# Patient Record
Sex: Female | Born: 1935
Health system: Southern US, Community
[De-identification: ages and names within clinical notes are randomized; demographics above are authoritative.]

## PROBLEM LIST (undated history)

## (undated) DIAGNOSIS — R002 Palpitations: Secondary | ICD-10-CM

## (undated) DIAGNOSIS — H9190 Unspecified hearing loss, unspecified ear: Secondary | ICD-10-CM

## (undated) DIAGNOSIS — I10 Essential (primary) hypertension: Secondary | ICD-10-CM

## (undated) DIAGNOSIS — E039 Hypothyroidism, unspecified: Secondary | ICD-10-CM

## (undated) DIAGNOSIS — D696 Thrombocytopenia, unspecified: Secondary | ICD-10-CM

## (undated) DIAGNOSIS — C9110 Chronic lymphocytic leukemia of B-cell type not having achieved remission: Secondary | ICD-10-CM

## (undated) DIAGNOSIS — E785 Hyperlipidemia, unspecified: Secondary | ICD-10-CM

## (undated) DIAGNOSIS — Z9181 History of falling: Secondary | ICD-10-CM

## (undated) DIAGNOSIS — Z9221 Personal history of antineoplastic chemotherapy: Secondary | ICD-10-CM

## (undated) DIAGNOSIS — D649 Anemia, unspecified: Secondary | ICD-10-CM

## (undated) DIAGNOSIS — H269 Unspecified cataract: Secondary | ICD-10-CM

## (undated) HISTORY — DX: Palpitations: R00.2

## (undated) HISTORY — DX: Unspecified cataract: H26.9

## (undated) HISTORY — DX: Chronic lymphocytic leukemia of B-cell type not having achieved remission: C91.10

## (undated) HISTORY — DX: Thrombocytopenia, unspecified: D69.6

## (undated) HISTORY — DX: Anemia, unspecified: D64.9

## (undated) HISTORY — DX: Hyperlipidemia, unspecified: E78.5

## (undated) HISTORY — DX: History of falling: Z91.81

## (undated) HISTORY — DX: Unspecified hearing loss, unspecified ear: H91.90

## (undated) HISTORY — DX: Hypothyroidism, unspecified: E03.9

## (undated) HISTORY — DX: Personal history of antineoplastic chemotherapy: Z92.21

---

## 2007-04-16 ENCOUNTER — Emergency Department: Payer: Self-pay | Admitting: Emergency Medicine

## 2011-04-15 ENCOUNTER — Ambulatory Visit: Payer: Self-pay | Admitting: Internal Medicine

## 2011-04-20 ENCOUNTER — Ambulatory Visit: Payer: Self-pay | Admitting: Oncology

## 2011-04-20 LAB — BASIC METABOLIC PANEL
BUN: 30 mg/dL — ABNORMAL HIGH (ref 7–18)
Calcium, Total: 9.2 mg/dL (ref 8.5–10.1)
Chloride: 106 mmol/L (ref 98–107)
Co2: 26 mmol/L (ref 21–32)
Creatinine: 1.16 mg/dL (ref 0.60–1.30)
EGFR (Non-African Amer.): 48 — ABNORMAL LOW
Osmolality: 291 (ref 275–301)
Potassium: 3.8 mmol/L (ref 3.5–5.1)
Sodium: 142 mmol/L (ref 136–145)

## 2011-04-20 LAB — CBC CANCER CENTER
Eosinophil #: 0.2 x10 3/mm (ref 0.0–0.7)
Eosinophil %: 0.1 %
HGB: 8.4 g/dL — ABNORMAL LOW (ref 12.0–16.0)
Lymphocyte %: 95.2 %
MCH: 32.8 pg (ref 26.0–34.0)
Neutrophil #: 7.1 x10 3/mm — ABNORMAL HIGH (ref 1.4–6.5)
Neutrophil %: 4.2 %
Platelet: 109 x10 3/mm — ABNORMAL LOW (ref 150–440)
WBC: 171 x10 3/mm — ABNORMAL HIGH (ref 3.6–11.0)

## 2011-04-20 LAB — LACTATE DEHYDROGENASE: LDH: 520 U/L — ABNORMAL HIGH (ref 84–246)

## 2011-04-30 ENCOUNTER — Ambulatory Visit: Payer: Self-pay | Admitting: General Surgery

## 2011-05-04 ENCOUNTER — Ambulatory Visit: Payer: Self-pay | Admitting: General Surgery

## 2011-05-04 HISTORY — PX: OTHER SURGICAL HISTORY: SHX169

## 2011-05-13 LAB — CBC CANCER CENTER
Basophil %: 0 %
HCT: 25.7 % — ABNORMAL LOW (ref 35.0–47.0)
HGB: 7.1 g/dL — ABNORMAL LOW (ref 12.0–16.0)
Lymphocyte #: 119.2 x10 3/mm — ABNORMAL HIGH (ref 1.0–3.6)
Lymphocyte %: 94.1 %
MCV: 107 fL — ABNORMAL HIGH (ref 80–100)
Monocyte #: 1.3 x10 3/mm — ABNORMAL HIGH (ref 0.2–0.9)
Monocyte %: 1.1 %
Neutrophil #: 5.8 x10 3/mm (ref 1.4–6.5)
Neutrophil %: 4.6 %
Platelet: 79 x10 3/mm — ABNORMAL LOW (ref 150–440)
RBC: 2.4 10*6/uL — ABNORMAL LOW (ref 3.80–5.20)
RDW: 18.9 % — ABNORMAL HIGH (ref 11.5–14.5)

## 2011-05-13 LAB — BASIC METABOLIC PANEL
BUN: 23 mg/dL — ABNORMAL HIGH (ref 7–18)
Calcium, Total: 8.9 mg/dL (ref 8.5–10.1)
EGFR (Non-African Amer.): 52 — ABNORMAL LOW
Osmolality: 280 (ref 275–301)
Potassium: 3.5 mmol/L (ref 3.5–5.1)

## 2011-05-18 LAB — CBC CANCER CENTER
Basophil #: 0.1 x10 3/mm (ref 0.0–0.1)
Eosinophil #: 0.2 x10 3/mm (ref 0.0–0.7)
HCT: 33.4 % — ABNORMAL LOW (ref 35.0–47.0)
HGB: 10.3 g/dL — ABNORMAL LOW (ref 12.0–16.0)
Lymphocyte %: 89.6 %
MCV: 100 fL (ref 80–100)
Monocyte #: 0.2 x10 3/mm (ref 0.2–0.9)
Monocyte %: 0.3 %
Neutrophil #: 5.6 x10 3/mm (ref 1.4–6.5)
RBC: 3.36 10*6/uL — ABNORMAL LOW (ref 3.80–5.20)
RDW: 17.1 % — ABNORMAL HIGH (ref 11.5–14.5)

## 2011-05-18 LAB — IRON AND TIBC
Iron Bind.Cap.(Total): 263 ug/dL (ref 250–450)
Iron Saturation: 10 %
Unbound Iron-Bind.Cap.: 238 ug/dL

## 2011-05-20 ENCOUNTER — Ambulatory Visit: Payer: Self-pay | Admitting: Oncology

## 2011-05-25 LAB — CBC CANCER CENTER
Basophil %: 0.4 %
Eosinophil #: 0.2 x10 3/mm (ref 0.0–0.7)
HCT: 33.1 % — ABNORMAL LOW (ref 35.0–47.0)
HGB: 10.2 g/dL — ABNORMAL LOW (ref 12.0–16.0)
Lymphocyte #: 69.4 x10 3/mm — ABNORMAL HIGH (ref 1.0–3.6)
Lymphocyte %: 89.4 %
MCH: 30.9 pg (ref 26.0–34.0)
MCV: 100 fL (ref 80–100)
Monocyte #: 1.9 x10 3/mm — ABNORMAL HIGH (ref 0.2–0.9)
Monocyte %: 2.4 %
Neutrophil #: 5.9 x10 3/mm (ref 1.4–6.5)
Neutrophil %: 7.6 %
Platelet: 102 x10 3/mm — ABNORMAL LOW (ref 150–440)
RBC: 3.29 10*6/uL — ABNORMAL LOW (ref 3.80–5.20)
RDW: 17.5 % — ABNORMAL HIGH (ref 11.5–14.5)

## 2011-05-25 LAB — BASIC METABOLIC PANEL
Anion Gap: 8 (ref 7–16)
Calcium, Total: 9 mg/dL (ref 8.5–10.1)
Chloride: 103 mmol/L (ref 98–107)
Co2: 30 mmol/L (ref 21–32)
Creatinine: 0.97 mg/dL (ref 0.60–1.30)
Glucose: 143 mg/dL — ABNORMAL HIGH (ref 65–99)
Osmolality: 283 (ref 275–301)
Sodium: 139 mmol/L (ref 136–145)

## 2011-06-02 LAB — CBC CANCER CENTER
Basophil #: 0 x10 3/mm (ref 0.0–0.1)
Basophil %: 0.4 %
Eosinophil #: 0 x10 3/mm (ref 0.0–0.7)
Eosinophil %: 0.9 %
HCT: 29.9 % — ABNORMAL LOW (ref 35.0–47.0)
HGB: 9.6 g/dL — ABNORMAL LOW (ref 12.0–16.0)
Lymphocyte #: 1.8 x10 3/mm (ref 1.0–3.6)
Lymphocyte %: 39.7 %
MCV: 96 fL (ref 80–100)
Monocyte #: 0.5 x10 3/mm (ref 0.2–0.9)
Monocyte %: 11.6 %
Neutrophil %: 47.4 %
RBC: 3.13 10*6/uL — ABNORMAL LOW (ref 3.80–5.20)

## 2011-06-08 LAB — CBC CANCER CENTER
Basophil #: 0 x10 3/mm (ref 0.0–0.1)
Basophil %: 0.8 %
Eosinophil #: 0 x10 3/mm (ref 0.0–0.7)
Eosinophil %: 0.7 %
HCT: 25.6 % — ABNORMAL LOW (ref 35.0–47.0)
Lymphocyte %: 21.9 %
MCH: 30.1 pg (ref 26.0–34.0)
Monocyte #: 0.4 x10 3/mm (ref 0.2–0.9)
Monocyte %: 10.5 %
Neutrophil %: 66.1 %
Platelet: 162 x10 3/mm (ref 150–440)
RDW: 17.6 % — ABNORMAL HIGH (ref 11.5–14.5)

## 2011-06-16 LAB — CBC CANCER CENTER
Basophil %: 1 %
HCT: 22.6 % — ABNORMAL LOW (ref 35.0–47.0)
Lymphocyte %: 24.9 %
MCH: 29 pg (ref 26.0–34.0)
MCV: 91 fL (ref 80–100)
Monocyte %: 11.8 %
Platelet: 312 x10 3/mm (ref 150–440)

## 2011-06-20 ENCOUNTER — Ambulatory Visit: Payer: Self-pay | Admitting: Oncology

## 2011-06-22 LAB — CBC CANCER CENTER
Basophil %: 1.9 %
Eosinophil %: 1.2 %
HCT: 34.9 % — ABNORMAL LOW (ref 35.0–47.0)
HGB: 11.2 g/dL — ABNORMAL LOW (ref 12.0–16.0)
Lymphocyte #: 1.2 x10 3/mm (ref 1.0–3.6)
Lymphocyte %: 20.9 %
MCH: 28.7 pg (ref 26.0–34.0)
MCHC: 32.2 g/dL (ref 32.0–36.0)
Monocyte #: 0.4 x10 3/mm (ref 0.2–0.9)
Monocyte %: 7.1 %
Neutrophil #: 3.8 x10 3/mm (ref 1.4–6.5)
Platelet: 212 x10 3/mm (ref 150–440)
RBC: 3.92 10*6/uL (ref 3.80–5.20)
RDW: 17.9 % — ABNORMAL HIGH (ref 11.5–14.5)

## 2011-06-22 LAB — COMPREHENSIVE METABOLIC PANEL
Albumin: 3.2 g/dL — ABNORMAL LOW (ref 3.4–5.0)
Alkaline Phosphatase: 59 U/L (ref 50–136)
Chloride: 104 mmol/L (ref 98–107)
Creatinine: 0.84 mg/dL (ref 0.60–1.30)
EGFR (Non-African Amer.): >60
Glucose: 138 mg/dL — ABNORMAL HIGH (ref 65–99)
Sodium: 141 mmol/L (ref 136–145)

## 2011-06-29 LAB — CBC CANCER CENTER
Basophil #: 0 x10 3/mm (ref 0.0–0.1)
Basophil %: 0.6 %
Eosinophil %: 1.9 %
HCT: 32.1 % — ABNORMAL LOW (ref 35.0–47.0)
MCH: 28.7 pg (ref 26.0–34.0)
Monocyte #: 0.3 x10 3/mm (ref 0.2–0.9)
RDW: 18.9 % — ABNORMAL HIGH (ref 11.5–14.5)

## 2011-07-06 LAB — CBC CANCER CENTER
Eosinophil #: 0.1 x10 3/mm (ref 0.0–0.7)
HCT: 31.7 % — ABNORMAL LOW (ref 35.0–47.0)
MCH: 28.4 pg (ref 26.0–34.0)
Monocyte #: 0.2 x10 3/mm (ref 0.2–0.9)
Monocyte %: 12.7 %
Neutrophil #: 1 x10 3/mm — ABNORMAL LOW (ref 1.4–6.5)
Neutrophil %: 55.9 %
Platelet: 120 x10 3/mm — ABNORMAL LOW (ref 150–440)
RBC: 3.55 10*6/uL — ABNORMAL LOW (ref 3.80–5.20)
RDW: 18.8 % — ABNORMAL HIGH (ref 11.5–14.5)
WBC: 1.8 x10 3/mm — CL (ref 3.6–11.0)

## 2011-07-13 LAB — CBC CANCER CENTER
Basophil #: 0 x10 3/mm (ref 0.0–0.1)
Basophil %: 0.6 %
Eosinophil %: 3.3 %
Lymphocyte #: 1.1 x10 3/mm (ref 1.0–3.6)
Lymphocyte %: 42.7 %
MCHC: 32.1 g/dL (ref 32.0–36.0)
MCV: 88 fL (ref 80–100)
Monocyte #: 0.3 x10 3/mm (ref 0.2–0.9)
Monocyte %: 13.2 %
Platelet: 105 x10 3/mm — ABNORMAL LOW (ref 150–440)
RBC: 3.86 10*6/uL (ref 3.80–5.20)
RDW: 18.4 % — ABNORMAL HIGH (ref 11.5–14.5)
WBC: 2.6 x10 3/mm — ABNORMAL LOW (ref 3.6–11.0)

## 2011-07-20 ENCOUNTER — Ambulatory Visit: Payer: Self-pay | Admitting: Oncology

## 2011-07-20 LAB — CBC CANCER CENTER
Basophil #: 0 x10 3/mm (ref 0.0–0.1)
Eosinophil #: 0.2 x10 3/mm (ref 0.0–0.7)
Eosinophil %: 5.6 %
HCT: 36.8 % (ref 35.0–47.0)
HGB: 11.7 g/dL — ABNORMAL LOW (ref 12.0–16.0)
Lymphocyte #: 1.2 x10 3/mm (ref 1.0–3.6)
Lymphocyte %: 41.5 %
MCH: 28.2 pg (ref 26.0–34.0)
MCHC: 31.7 g/dL — ABNORMAL LOW (ref 32.0–36.0)
Neutrophil #: 1.2 x10 3/mm — ABNORMAL LOW (ref 1.4–6.5)
RBC: 4.14 10*6/uL (ref 3.80–5.20)
RDW: 17 % — ABNORMAL HIGH (ref 11.5–14.5)

## 2011-07-20 LAB — COMPREHENSIVE METABOLIC PANEL
Albumin: 3.5 g/dL (ref 3.4–5.0)
Anion Gap: 9 (ref 7–16)
BUN: 18 mg/dL (ref 7–18)
Chloride: 104 mmol/L (ref 98–107)
Co2: 28 mmol/L (ref 21–32)
EGFR (African American): >60
EGFR (Non-African Amer.): >60
Osmolality: 284 (ref 275–301)
Potassium: 4 mmol/L (ref 3.5–5.1)
SGOT(AST): 18 U/L (ref 15–37)

## 2011-07-27 LAB — CBC CANCER CENTER
Basophil #: 0 x10 3/mm (ref 0.0–0.1)
Basophil %: 0.8 %
Eosinophil %: 7.1 %
HCT: 36.8 % (ref 35.0–47.0)
HGB: 12 g/dL (ref 12.0–16.0)
Lymphocyte #: 1.4 x10 3/mm (ref 1.0–3.6)
Lymphocyte %: 40.1 %
MCV: 88 fL (ref 80–100)
Monocyte %: 8.5 %
Neutrophil #: 1.5 x10 3/mm (ref 1.4–6.5)
Platelet: 106 x10 3/mm — ABNORMAL LOW (ref 150–440)
RDW: 16.9 % — ABNORMAL HIGH (ref 11.5–14.5)
WBC: 3.4 x10 3/mm — ABNORMAL LOW (ref 3.6–11.0)

## 2011-08-03 LAB — CBC CANCER CENTER
Basophil #: 0 x10 3/mm (ref 0.0–0.1)
Eosinophil #: 0.1 x10 3/mm (ref 0.0–0.7)
Eosinophil %: 7.2 %
Lymphocyte #: 0.4 x10 3/mm — ABNORMAL LOW (ref 1.0–3.6)
MCH: 28.4 pg (ref 26.0–34.0)
MCV: 88 fL (ref 80–100)
Monocyte #: 0.3 x10 3/mm (ref 0.2–0.9)
Monocyte %: 14 %
Neutrophil #: 1.2 x10 3/mm — ABNORMAL LOW (ref 1.4–6.5)
Neutrophil %: 59 %
Platelet: 112 x10 3/mm — ABNORMAL LOW (ref 150–440)
RBC: 4.43 10*6/uL (ref 3.80–5.20)
RDW: 16.4 % — ABNORMAL HIGH (ref 11.5–14.5)

## 2011-08-10 LAB — CBC CANCER CENTER
Basophil #: 0 x10 3/mm (ref 0.0–0.1)
Eosinophil #: 0.1 x10 3/mm (ref 0.0–0.7)
HCT: 36.8 % (ref 35.0–47.0)
HGB: 12 g/dL (ref 12.0–16.0)
Lymphocyte #: 0.3 x10 3/mm — ABNORMAL LOW (ref 1.0–3.6)
MCH: 28.5 pg (ref 26.0–34.0)
MCV: 88 fL (ref 80–100)
Monocyte #: 0.3 x10 3/mm (ref 0.2–0.9)
Monocyte %: 19.2 %
Platelet: 129 x10 3/mm — ABNORMAL LOW (ref 150–440)
RBC: 4.2 10*6/uL (ref 3.80–5.20)
RDW: 16 % — ABNORMAL HIGH (ref 11.5–14.5)
WBC: 1.8 x10 3/mm — CL (ref 3.6–11.0)

## 2011-08-17 LAB — CBC CANCER CENTER
Basophil %: 1 %
HCT: 38 % (ref 35.0–47.0)
HGB: 13.1 g/dL (ref 12.0–16.0)
Lymphocyte #: 0.4 x10 3/mm — ABNORMAL LOW (ref 1.0–3.6)
Lymphocyte %: 20.5 %
MCH: 29.7 pg (ref 26.0–34.0)
MCHC: 34.5 g/dL (ref 32.0–36.0)
MCV: 86 fL (ref 80–100)
Monocyte %: 14.7 %
Neutrophil #: 1.1 x10 3/mm — ABNORMAL LOW (ref 1.4–6.5)
Platelet: 117 x10 3/mm — ABNORMAL LOW (ref 150–440)

## 2011-08-20 ENCOUNTER — Ambulatory Visit: Payer: Self-pay | Admitting: Oncology

## 2011-08-24 LAB — CBC CANCER CENTER
Basophil #: 0 x10 3/mm (ref 0.0–0.1)
Basophil %: 1.5 %
Eosinophil #: 0.1 x10 3/mm (ref 0.0–0.7)
HGB: 12.5 g/dL (ref 12.0–16.0)
Lymphocyte #: 0.5 x10 3/mm — ABNORMAL LOW (ref 1.0–3.6)
Lymphocyte %: 21.5 %
MCHC: 34.2 g/dL (ref 32.0–36.0)
MCV: 87 fL (ref 80–100)
Neutrophil %: 54.7 %

## 2011-08-24 LAB — COMPREHENSIVE METABOLIC PANEL
Albumin: 3.5 g/dL (ref 3.4–5.0)
Alkaline Phosphatase: 71 U/L (ref 50–136)
BUN: 16 mg/dL (ref 7–18)
Bilirubin,Total: 0.6 mg/dL (ref 0.2–1.0)
Calcium, Total: 8.8 mg/dL (ref 8.5–10.1)
Chloride: 103 mmol/L (ref 98–107)
Co2: 29 mmol/L (ref 21–32)
Creatinine: 0.88 mg/dL (ref 0.60–1.30)
EGFR (African American): >60
EGFR (Non-African Amer.): >60
Osmolality: 285 (ref 275–301)
Potassium: 3.6 mmol/L (ref 3.5–5.1)
SGPT (ALT): 27 U/L (ref 12–78)
Sodium: 142 mmol/L (ref 136–145)
Total Protein: 6.1 g/dL — ABNORMAL LOW (ref 6.4–8.2)

## 2011-08-31 LAB — CBC CANCER CENTER
Eosinophil %: 3.6 %
HGB: 12.1 g/dL (ref 12.0–16.0)
Lymphocyte #: 0.6 x10 3/mm — ABNORMAL LOW (ref 1.0–3.6)
MCV: 86 fL (ref 80–100)
Monocyte %: 12.4 %
Neutrophil #: 1.3 x10 3/mm — ABNORMAL LOW (ref 1.4–6.5)
Neutrophil %: 58.4 %
Platelet: 112 x10 3/mm — ABNORMAL LOW (ref 150–440)
RBC: 4.08 10*6/uL (ref 3.80–5.20)

## 2011-09-07 LAB — CBC CANCER CENTER
Basophil #: 0 x10 3/mm (ref 0.0–0.1)
Eosinophil #: 0.1 x10 3/mm (ref 0.0–0.7)
Eosinophil %: 3.5 %
Lymphocyte %: 35.8 %
MCH: 28.9 pg (ref 26.0–34.0)
MCHC: 33.3 g/dL (ref 32.0–36.0)
MCV: 87 fL (ref 80–100)
Monocyte #: 0.4 x10 3/mm (ref 0.2–0.9)
Monocyte %: 13.6 %
Neutrophil %: 46.4 %
Platelet: 155 x10 3/mm (ref 150–440)
RDW: 16.2 % — ABNORMAL HIGH (ref 11.5–14.5)
WBC: 2.7 x10 3/mm — ABNORMAL LOW (ref 3.6–11.0)

## 2011-09-14 LAB — CBC CANCER CENTER
Basophil #: 0 x10 3/mm (ref 0.0–0.1)
Basophil %: 0.5 %
Eosinophil %: 2.9 %
HGB: 12.1 g/dL (ref 12.0–16.0)
Lymphocyte #: 1.6 x10 3/mm (ref 1.0–3.6)
Lymphocyte %: 51.3 %
MCH: 29.1 pg (ref 26.0–34.0)
MCHC: 33.1 g/dL (ref 32.0–36.0)
Monocyte #: 0.5 x10 3/mm (ref 0.2–0.9)
Neutrophil #: 0.9 x10 3/mm — ABNORMAL LOW (ref 1.4–6.5)
Neutrophil %: 29.7 %
Platelet: 133 x10 3/mm — ABNORMAL LOW (ref 150–440)
RBC: 4.15 10*6/uL (ref 3.80–5.20)
WBC: 3.2 x10 3/mm — ABNORMAL LOW (ref 3.6–11.0)

## 2011-09-20 ENCOUNTER — Ambulatory Visit: Payer: Self-pay | Admitting: Oncology

## 2011-09-22 LAB — COMPREHENSIVE METABOLIC PANEL
Albumin: 3.8 g/dL (ref 3.4–5.0)
Anion Gap: 9 (ref 7–16)
BUN: 16 mg/dL (ref 7–18)
Calcium, Total: 9.3 mg/dL (ref 8.5–10.1)
Co2: 28 mmol/L (ref 21–32)
EGFR (Non-African Amer.): >60
Glucose: 116 mg/dL — ABNORMAL HIGH (ref 65–99)
Potassium: 3.7 mmol/L (ref 3.5–5.1)
SGOT(AST): 25 U/L (ref 15–37)
Total Protein: 6.6 g/dL (ref 6.4–8.2)

## 2011-09-22 LAB — CBC CANCER CENTER
Basophil #: 0 x10 3/mm (ref 0.0–0.1)
Basophil %: 1.1 %
Eosinophil #: 0.2 x10 3/mm (ref 0.0–0.7)
Eosinophil %: 5 %
HCT: 39.3 % (ref 35.0–47.0)
MCHC: 33.3 g/dL (ref 32.0–36.0)
MCV: 88 fL (ref 80–100)
Monocyte #: 0.4 x10 3/mm (ref 0.2–0.9)
Monocyte %: 12.1 %
Neutrophil #: 1.2 x10 3/mm — ABNORMAL LOW (ref 1.4–6.5)
RBC: 4.46 10*6/uL (ref 3.80–5.20)
WBC: 3.2 x10 3/mm — ABNORMAL LOW (ref 3.6–11.0)

## 2011-09-29 LAB — CBC CANCER CENTER
Basophil %: 0.9 %
Eosinophil #: 0.1 x10 3/mm (ref 0.0–0.7)
Eosinophil %: 4.6 %
Lymphocyte #: 0.5 x10 3/mm — ABNORMAL LOW (ref 1.0–3.6)
Lymphocyte %: 24 %
MCH: 30.8 pg (ref 26.0–34.0)
MCHC: 35.7 g/dL (ref 32.0–36.0)
Monocyte #: 0.3 x10 3/mm (ref 0.2–0.9)
Monocyte %: 14.4 %
Neutrophil #: 1.1 x10 3/mm — ABNORMAL LOW (ref 1.4–6.5)
Neutrophil %: 56.1 %
RBC: 4.14 10*6/uL (ref 3.80–5.20)

## 2011-10-06 LAB — CBC CANCER CENTER
Basophil #: 0 x10 3/mm (ref 0.0–0.1)
Basophil %: 1.2 %
Eosinophil #: 0.1 x10 3/mm (ref 0.0–0.7)
HGB: 12.1 g/dL (ref 12.0–16.0)
Lymphocyte %: 47 %
MCH: 29.7 pg (ref 26.0–34.0)
MCHC: 33.8 g/dL (ref 32.0–36.0)
MCV: 88 fL (ref 80–100)
Monocyte #: 0.5 x10 3/mm (ref 0.2–0.9)
Neutrophil #: 0.2 x10 3/mm — ABNORMAL LOW (ref 1.4–6.5)
Neutrophil %: 13.4 %
Platelet: 150 x10 3/mm (ref 150–440)
RDW: 14.8 % — ABNORMAL HIGH (ref 11.5–14.5)

## 2011-10-13 LAB — CBC CANCER CENTER
Basophil #: 0 x10 3/mm (ref 0.0–0.1)
Eosinophil #: 0.1 x10 3/mm (ref 0.0–0.7)
Lymphocyte %: 41 %
MCH: 29.7 pg (ref 26.0–34.0)
MCHC: 33.7 g/dL (ref 32.0–36.0)
Monocyte #: 0.4 x10 3/mm (ref 0.2–0.9)
Neutrophil #: 0.3 x10 3/mm — ABNORMAL LOW (ref 1.4–6.5)
Neutrophil %: 20.9 %
RDW: 14.7 % — ABNORMAL HIGH (ref 11.5–14.5)
WBC: 1.5 x10 3/mm — CL (ref 3.6–11.0)

## 2011-10-20 ENCOUNTER — Ambulatory Visit: Payer: Self-pay | Admitting: Oncology

## 2011-10-20 LAB — CBC CANCER CENTER
Basophil %: 1.2 %
Eosinophil #: 0.1 x10 3/mm (ref 0.0–0.7)
HCT: 40.2 % (ref 35.0–47.0)
HGB: 13.4 g/dL (ref 12.0–16.0)
Lymphocyte #: 0.7 x10 3/mm — ABNORMAL LOW (ref 1.0–3.6)
MCH: 29.7 pg (ref 26.0–34.0)
MCHC: 33.4 g/dL (ref 32.0–36.0)
MCV: 89 fL (ref 80–100)
Monocyte #: 0.4 x10 3/mm (ref 0.2–0.9)
Neutrophil #: 1.1 x10 3/mm — ABNORMAL LOW (ref 1.4–6.5)
RBC: 4.52 10*6/uL (ref 3.80–5.20)

## 2011-10-20 LAB — COMPREHENSIVE METABOLIC PANEL
Albumin: 3.7 g/dL (ref 3.4–5.0)
Alkaline Phosphatase: 73 U/L (ref 50–136)
Bilirubin,Total: 0.5 mg/dL (ref 0.2–1.0)
Calcium, Total: 9.3 mg/dL (ref 8.5–10.1)
Chloride: 102 mmol/L (ref 98–107)
Creatinine: 0.87 mg/dL (ref 0.60–1.30)
Glucose: 109 mg/dL — ABNORMAL HIGH (ref 65–99)
Osmolality: 283 (ref 275–301)
Potassium: 3.9 mmol/L (ref 3.5–5.1)
Sodium: 141 mmol/L (ref 136–145)

## 2011-10-27 LAB — CBC CANCER CENTER
Basophil #: 0 x10 3/mm (ref 0.0–0.1)
Eosinophil #: 0.1 x10 3/mm (ref 0.0–0.7)
HCT: 41.3 % (ref 35.0–47.0)
HGB: 13.8 g/dL (ref 12.0–16.0)
Lymphocyte #: 1 x10 3/mm (ref 1.0–3.6)
Lymphocyte %: 32.1 %
MCH: 29.8 pg (ref 26.0–34.0)
MCHC: 33.3 g/dL (ref 32.0–36.0)
MCV: 89 fL (ref 80–100)
Monocyte %: 16.6 %
Neutrophil %: 46.3 %
RBC: 4.62 10*6/uL (ref 3.80–5.20)
RDW: 14.6 % — ABNORMAL HIGH (ref 11.5–14.5)
WBC: 3.2 x10 3/mm — ABNORMAL LOW (ref 3.6–11.0)

## 2011-11-03 LAB — CBC CANCER CENTER
Bands: 4 %
Eosinophil: 1 %
Lymphocytes: 1 %
MCH: 30 pg (ref 26.0–34.0)
MCHC: 33.5 g/dL (ref 32.0–36.0)
Platelet: 122 x10 3/mm — ABNORMAL LOW (ref 150–440)
RDW: 15.2 % — ABNORMAL HIGH (ref 11.5–14.5)
Segmented Neutrophils: 77 %
WBC: 9.3 x10 3/mm (ref 3.6–11.0)

## 2011-11-10 LAB — CBC CANCER CENTER
Basophil #: 0 x10 3/mm (ref 0.0–0.1)
Basophil %: 0.9 %
Eosinophil #: 0.1 x10 3/mm (ref 0.0–0.7)
HCT: 39.1 % (ref 35.0–47.0)
HGB: 13 g/dL (ref 12.0–16.0)
Lymphocyte #: 0.6 x10 3/mm — ABNORMAL LOW (ref 1.0–3.6)
MCH: 29.7 pg (ref 26.0–34.0)
MCHC: 33.1 g/dL (ref 32.0–36.0)
MCV: 90 fL (ref 80–100)
Monocyte #: 0.5 x10 3/mm (ref 0.2–0.9)
Monocyte %: 15.4 %
Neutrophil #: 2.1 x10 3/mm (ref 1.4–6.5)
RBC: 4.36 10*6/uL (ref 3.80–5.20)
RDW: 14.6 % — ABNORMAL HIGH (ref 11.5–14.5)
WBC: 3.3 x10 3/mm — ABNORMAL LOW (ref 3.6–11.0)

## 2011-11-17 ENCOUNTER — Ambulatory Visit: Payer: Self-pay | Admitting: Oncology

## 2011-11-17 LAB — CBC CANCER CENTER
Basophil #: 0 x10 3/mm (ref 0.0–0.1)
Eosinophil #: 0.1 x10 3/mm (ref 0.0–0.7)
Eosinophil %: 3.2 %
HGB: 13.5 g/dL (ref 12.0–16.0)
Lymphocyte #: 0.9 x10 3/mm — ABNORMAL LOW (ref 1.0–3.6)
Lymphocyte %: 20.9 %
MCH: 29.9 pg (ref 26.0–34.0)
MCHC: 33.1 g/dL (ref 32.0–36.0)
MCV: 90 fL (ref 80–100)
Monocyte #: 0.6 x10 3/mm (ref 0.2–0.9)
Neutrophil #: 2.6 x10 3/mm (ref 1.4–6.5)
Neutrophil %: 60.9 %
Platelet: 135 x10 3/mm — ABNORMAL LOW (ref 150–440)
RBC: 4.53 10*6/uL (ref 3.80–5.20)
RDW: 14.6 % — ABNORMAL HIGH (ref 11.5–14.5)

## 2011-11-20 ENCOUNTER — Ambulatory Visit: Payer: Self-pay | Admitting: Oncology

## 2011-11-24 LAB — COMPREHENSIVE METABOLIC PANEL
Albumin: 3.8 g/dL (ref 3.4–5.0)
Anion Gap: 13 (ref 7–16)
BUN: 20 mg/dL — ABNORMAL HIGH (ref 7–18)
Calcium, Total: 9.5 mg/dL (ref 8.5–10.1)
Chloride: 100 mmol/L (ref 98–107)
Co2: 23 mmol/L (ref 21–32)
EGFR (African American): >60
EGFR (Non-African Amer.): >60
Glucose: 89 mg/dL (ref 65–99)
Osmolality: 274 (ref 275–301)
Potassium: 3.8 mmol/L (ref 3.5–5.1)
SGOT(AST): 27 U/L (ref 15–37)
Sodium: 136 mmol/L (ref 136–145)

## 2011-11-24 LAB — CBC CANCER CENTER
Basophil #: 0 x10 3/mm (ref 0.0–0.1)
Eosinophil #: 0.2 x10 3/mm (ref 0.0–0.7)
Lymphocyte %: 22.2 %
MCHC: 33.8 g/dL (ref 32.0–36.0)
Monocyte %: 14.1 %
Neutrophil #: 2.2 x10 3/mm (ref 1.4–6.5)
Neutrophil %: 58.1 %
Platelet: 146 x10 3/mm — ABNORMAL LOW (ref 150–440)
RBC: 4.51 10*6/uL (ref 3.80–5.20)
RDW: 14.5 % (ref 11.5–14.5)
WBC: 3.8 x10 3/mm (ref 3.6–11.0)

## 2011-12-20 ENCOUNTER — Ambulatory Visit: Payer: Self-pay | Admitting: Oncology

## 2012-01-26 ENCOUNTER — Ambulatory Visit: Payer: Self-pay | Admitting: Oncology

## 2012-01-26 LAB — CBC CANCER CENTER
Basophil #: 0 x10 3/mm (ref 0.0–0.1)
Basophil %: 1.3 %
HCT: 42.4 % (ref 35.0–47.0)
Lymphocyte %: 38.8 %
MCHC: 34.6 g/dL (ref 32.0–36.0)
MCV: 88 fL (ref 80–100)
Monocyte #: 0.4 x10 3/mm (ref 0.2–0.9)
Neutrophil #: 1.5 x10 3/mm (ref 1.4–6.5)
Neutrophil %: 46.4 %
RBC: 4.8 10*6/uL (ref 3.80–5.20)

## 2012-01-26 LAB — COMPREHENSIVE METABOLIC PANEL
Albumin: 4 g/dL (ref 3.4–5.0)
Anion Gap: 11 (ref 7–16)
BUN: 20 mg/dL — ABNORMAL HIGH (ref 7–18)
Bilirubin,Total: 0.5 mg/dL (ref 0.2–1.0)
Co2: 27 mmol/L (ref 21–32)
Creatinine: 1.03 mg/dL (ref 0.60–1.30)
EGFR (African American): 59 — ABNORMAL LOW
EGFR (Non-African Amer.): 51 — ABNORMAL LOW
Glucose: 93 mg/dL (ref 65–99)
Osmolality: 284 (ref 275–301)
Potassium: 3.7 mmol/L (ref 3.5–5.1)
SGOT(AST): 28 U/L (ref 15–37)
SGPT (ALT): 38 U/L (ref 12–78)
Total Protein: 7.2 g/dL (ref 6.4–8.2)

## 2012-02-20 ENCOUNTER — Ambulatory Visit: Payer: Self-pay | Admitting: Oncology

## 2012-03-17 ENCOUNTER — Ambulatory Visit: Payer: Self-pay | Admitting: Ophthalmology

## 2012-03-17 DIAGNOSIS — I10 Essential (primary) hypertension: Secondary | ICD-10-CM

## 2012-03-17 LAB — POTASSIUM: Potassium: 3.5 mmol/L (ref 3.5–5.1)

## 2012-03-29 ENCOUNTER — Ambulatory Visit: Payer: Self-pay | Admitting: Ophthalmology

## 2012-03-29 HISTORY — PX: CATARACT EXTRACTION: SUR2

## 2012-04-19 ENCOUNTER — Ambulatory Visit: Payer: Self-pay | Admitting: Oncology

## 2012-04-26 LAB — COMPREHENSIVE METABOLIC PANEL
Albumin: 4 g/dL (ref 3.4–5.0)
BUN: 26 mg/dL — ABNORMAL HIGH (ref 7–18)
Calcium, Total: 9.5 mg/dL (ref 8.5–10.1)
Chloride: 101 mmol/L (ref 98–107)
Co2: 29 mmol/L (ref 21–32)
Creatinine: 1.14 mg/dL (ref 0.60–1.30)
EGFR (African American): 52 — ABNORMAL LOW
Glucose: 111 mg/dL — ABNORMAL HIGH (ref 65–99)
Potassium: 3.5 mmol/L (ref 3.5–5.1)
SGPT (ALT): 36 U/L (ref 12–78)
Sodium: 139 mmol/L (ref 136–145)

## 2012-04-26 LAB — CBC CANCER CENTER
Basophil #: 0 x10 3/mm (ref 0.0–0.1)
Basophil %: 0.7 %
Eosinophil #: 0.1 x10 3/mm (ref 0.0–0.7)
Eosinophil %: 2 %
Lymphocyte %: 34.1 %
MCHC: 34.4 g/dL (ref 32.0–36.0)
MCV: 88 fL (ref 80–100)
Monocyte #: 0.4 x10 3/mm (ref 0.2–0.9)
Monocyte %: 9.8 %
Neutrophil #: 2.3 x10 3/mm (ref 1.4–6.5)
Neutrophil %: 53.4 %
RBC: 4.82 10*6/uL (ref 3.80–5.20)
RDW: 13 % (ref 11.5–14.5)
WBC: 4.2 x10 3/mm (ref 3.6–11.0)

## 2012-05-19 ENCOUNTER — Ambulatory Visit: Payer: Self-pay | Admitting: Oncology

## 2012-06-30 ENCOUNTER — Emergency Department: Payer: Self-pay | Admitting: Internal Medicine

## 2012-07-05 ENCOUNTER — Ambulatory Visit: Payer: Self-pay | Admitting: Internal Medicine

## 2012-08-19 ENCOUNTER — Ambulatory Visit: Payer: Self-pay | Admitting: Oncology

## 2012-08-23 LAB — CBC CANCER CENTER
Basophil #: 0 x10 3/mm (ref 0.0–0.1)
Eosinophil #: 0.2 x10 3/mm (ref 0.0–0.7)
Eosinophil %: 3.5 %
HCT: 38.4 % (ref 35.0–47.0)
HGB: 13.6 g/dL (ref 12.0–16.0)
Lymphocyte #: 2 x10 3/mm (ref 1.0–3.6)
MCHC: 35.3 g/dL (ref 32.0–36.0)
Monocyte #: 0.5 x10 3/mm (ref 0.2–0.9)
Monocyte %: 9.4 %
Neutrophil #: 2.9 x10 3/mm (ref 1.4–6.5)
Neutrophil %: 51.3 %
Platelet: 139 x10 3/mm — ABNORMAL LOW (ref 150–440)
RBC: 4.51 10*6/uL (ref 3.80–5.20)
WBC: 5.7 x10 3/mm (ref 3.6–11.0)

## 2012-08-23 LAB — COMPREHENSIVE METABOLIC PANEL
Albumin: 3.7 g/dL (ref 3.4–5.0)
Alkaline Phosphatase: 88 U/L (ref 50–136)
Anion Gap: 8 (ref 7–16)
BUN: 19 mg/dL — ABNORMAL HIGH (ref 7–18)
Bilirubin,Total: 0.6 mg/dL (ref 0.2–1.0)
Calcium, Total: 9.3 mg/dL (ref 8.5–10.1)
Chloride: 103 mmol/L (ref 98–107)
Co2: 28 mmol/L (ref 21–32)
Creatinine: 1.06 mg/dL (ref 0.60–1.30)
EGFR (African American): 57 — ABNORMAL LOW
Glucose: 121 mg/dL — ABNORMAL HIGH (ref 65–99)
Potassium: 3.4 mmol/L — ABNORMAL LOW (ref 3.5–5.1)
SGOT(AST): 17 U/L (ref 15–37)

## 2012-09-19 ENCOUNTER — Ambulatory Visit: Payer: Self-pay | Admitting: Oncology

## 2012-11-23 ENCOUNTER — Ambulatory Visit: Payer: Self-pay | Admitting: Oncology

## 2012-11-23 LAB — CBC CANCER CENTER
Basophil #: 0 x10 3/mm (ref 0.0–0.1)
Basophil %: 0.4 %
Eosinophil #: 0.2 x10 3/mm (ref 0.0–0.7)
Eosinophil %: 4.1 %
HCT: 40.1 % (ref 35.0–47.0)
HGB: 13.4 g/dL (ref 12.0–16.0)
Lymphocyte #: 2.6 x10 3/mm (ref 1.0–3.6)
MCH: 28.6 pg (ref 26.0–34.0)
MCHC: 33.4 g/dL (ref 32.0–36.0)
MCV: 86 fL (ref 80–100)
Monocyte #: 0.5 x10 3/mm (ref 0.2–0.9)
Monocyte %: 7.9 %
Neutrophil #: 2.5 x10 3/mm (ref 1.4–6.5)
RBC: 4.68 10*6/uL (ref 3.80–5.20)
RDW: 14.5 % (ref 11.5–14.5)
WBC: 5.9 x10 3/mm (ref 3.6–11.0)

## 2012-11-23 LAB — COMPREHENSIVE METABOLIC PANEL
Albumin: 3.8 g/dL (ref 3.4–5.0)
Alkaline Phosphatase: 81 U/L (ref 50–136)
Anion Gap: 9 (ref 7–16)
BUN: 19 mg/dL — ABNORMAL HIGH (ref 7–18)
EGFR (African American): 54 — ABNORMAL LOW
EGFR (Non-African Amer.): 46 — ABNORMAL LOW
Osmolality: 280 (ref 275–301)
Potassium: 3.3 mmol/L — ABNORMAL LOW (ref 3.5–5.1)
SGPT (ALT): 30 U/L (ref 12–78)

## 2012-12-16 IMAGING — PT NM PET TUM IMG RESTAG (PS) SKULL BASE T - THIGH
1 of 5 series · 1 of 25 positions shown · non-contrast
Comparison: none

REASON FOR EXAM: restaging  chronic lymphocytic leukemia
COMMENTS:

PROCEDURE:     PET - PET/CT RESTAGING LEUKEMIA  - November 17, 2011  [DATE]
RESULT:
TECHNIQUE: The patient has a fasting blood glucose level of 92 mg/dl. The
patient received an injection of 13.17 mCi of F18-FDG in the left hand at
[DATE] with imaging obtained between [DATE] and [DATE] hours from the base
of the brain into the thighs. Noncontrast, low dose CT is performed over the
same regions for attenuation, correction and fusion. The noncontrast, low
dose CT, attenuation corrected PET images and fused PET/CT data are
reconstructed by the [employer] software in the axial, coronal and sagittal
planes with a rotating three dimensional maximum intensity projection image
also created.
There is no previous exam for comparison.

[Series 3: ct wb 3.0 b30f · axial · 3.0mm · 0.98mm/px · 1 of 435 slices shown]
[im 435/435  brain]
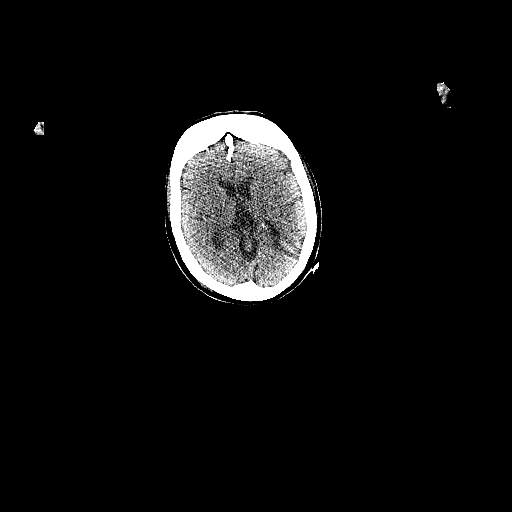

[1 of 25 positions shown; findings below may reference images not displayed]

FINDINGS: The images demonstrate no evidence of abnormal accumulation of
F18-FDG to suggest F18-FDG avid malignant disease. There is mild
localization within the marrow diffusely which could be a post chemotherapy
response. There is likely a small cyst or hemangioma in the right lobe of
the liver. Density is seen within the gallbladder which could represent
stones or sludge. There is a small, fat filled umbilical hernia. The spleen
is elongated with a measurement of 13.08 cm. This is smaller than the
previous measurement of 19.29 cm anterior to posterior on the previous
abdominal sonogram. The anterior to posterior dimension is currently
cm. There is a slightly hyperdense mass along the medial side of the spleen
measuring up to 2.8 cm on CT image 208. There is no abnormal accumulation of
F18-FDG in the spleen or more focally in this splenic mass.
IMPRESSION: 1.  No evidence of F18-FDG avid malignant disease.
2.  Possible cholelithiasis. This was present on the previous Abdominal
Sonogram.
3.  Decreasing size of the spleen with a persistent mass in the spleen
compared to the previous Abdominal Sonogram.

[REDACTED]

## 2012-12-19 ENCOUNTER — Ambulatory Visit: Payer: Self-pay | Admitting: Oncology

## 2013-03-23 ENCOUNTER — Ambulatory Visit: Payer: Self-pay | Admitting: Oncology

## 2013-03-23 LAB — CBC CANCER CENTER
BASOS ABS: 0 x10 3/mm (ref 0.0–0.1)
Basophil %: 0.5 %
EOS PCT: 1.9 %
Eosinophil #: 0.2 x10 3/mm (ref 0.0–0.7)
HCT: 41.4 % (ref 35.0–47.0)
HGB: 13.7 g/dL (ref 12.0–16.0)
LYMPHS PCT: 56.3 %
Lymphocyte #: 4.5 x10 3/mm — ABNORMAL HIGH (ref 1.0–3.6)
MCH: 28.3 pg (ref 26.0–34.0)
MCHC: 33 g/dL (ref 32.0–36.0)
MCV: 86 fL (ref 80–100)
Monocyte #: 0.7 x10 3/mm (ref 0.2–0.9)
Monocyte %: 8.7 %
NEUTROS ABS: 2.6 x10 3/mm (ref 1.4–6.5)
NEUTROS PCT: 32.6 %
PLATELETS: 146 x10 3/mm — AB (ref 150–440)
RBC: 4.82 10*6/uL (ref 3.80–5.20)
RDW: 14 % (ref 11.5–14.5)
WBC: 7.9 x10 3/mm (ref 3.6–11.0)

## 2013-03-23 LAB — COMPREHENSIVE METABOLIC PANEL
ALK PHOS: 73 U/L
ALT: 21 U/L (ref 12–78)
Albumin: 3.9 g/dL (ref 3.4–5.0)
Anion Gap: 9 (ref 7–16)
BUN: 20 mg/dL — ABNORMAL HIGH (ref 7–18)
Bilirubin,Total: 0.6 mg/dL (ref 0.2–1.0)
CALCIUM: 9 mg/dL (ref 8.5–10.1)
CO2: 26 mmol/L (ref 21–32)
Chloride: 100 mmol/L (ref 98–107)
Creatinine: 1.21 mg/dL (ref 0.60–1.30)
EGFR (African American): 48 — ABNORMAL LOW
GFR CALC NON AF AMER: 42 — AB
GLUCOSE: 115 mg/dL — AB (ref 65–99)
Osmolality: 274 (ref 275–301)
Potassium: 3.9 mmol/L (ref 3.5–5.1)
SGOT(AST): 23 U/L (ref 15–37)
SODIUM: 135 mmol/L — AB (ref 136–145)
Total Protein: 8 g/dL (ref 6.4–8.2)

## 2013-04-19 ENCOUNTER — Ambulatory Visit: Payer: Self-pay | Admitting: Oncology

## 2013-05-05 ENCOUNTER — Emergency Department: Payer: Self-pay | Admitting: Emergency Medicine

## 2013-07-27 ENCOUNTER — Ambulatory Visit: Payer: Self-pay | Admitting: Oncology

## 2013-07-27 LAB — LACTATE DEHYDROGENASE: LDH: 340 U/L — ABNORMAL HIGH (ref 81–246)

## 2013-07-27 LAB — CBC CANCER CENTER
BASOS ABS: 0.1 x10 3/mm (ref 0.0–0.1)
Basophil %: 0.4 %
EOS PCT: 1.3 %
Eosinophil #: 0.2 x10 3/mm (ref 0.0–0.7)
HCT: 42.6 % (ref 35.0–47.0)
HGB: 14.1 g/dL (ref 12.0–16.0)
LYMPHS PCT: 76.7 %
Lymphocyte #: 12.2 x10 3/mm — ABNORMAL HIGH (ref 1.0–3.6)
MCH: 29.7 pg (ref 26.0–34.0)
MCHC: 33.1 g/dL (ref 32.0–36.0)
MCV: 90 fL (ref 80–100)
Monocyte #: 0.8 x10 3/mm (ref 0.2–0.9)
Monocyte %: 4.8 %
NEUTROS ABS: 2.7 x10 3/mm (ref 1.4–6.5)
Neutrophil %: 16.8 %
PLATELETS: 160 x10 3/mm (ref 150–440)
RBC: 4.75 10*6/uL (ref 3.80–5.20)
RDW: 14.4 % (ref 11.5–14.5)
WBC: 15.9 x10 3/mm — ABNORMAL HIGH (ref 3.6–11.0)

## 2013-07-27 LAB — COMPREHENSIVE METABOLIC PANEL
Albumin: 4.1 g/dL (ref 3.4–5.0)
Alkaline Phosphatase: 59 U/L
Anion Gap: 10 (ref 7–16)
BILIRUBIN TOTAL: 0.8 mg/dL (ref 0.2–1.0)
BUN: 22 mg/dL — AB (ref 7–18)
CHLORIDE: 102 mmol/L (ref 98–107)
CO2: 28 mmol/L (ref 21–32)
Calcium, Total: 10 mg/dL (ref 8.5–10.1)
Creatinine: 1.27 mg/dL (ref 0.60–1.30)
EGFR (Non-African Amer.): 39 — ABNORMAL LOW
GFR CALC AF AMER: 45 — AB
Glucose: 167 mg/dL — ABNORMAL HIGH (ref 65–99)
Osmolality: 287 (ref 275–301)
Potassium: 3.5 mmol/L (ref 3.5–5.1)
SGOT(AST): 24 U/L (ref 15–37)
SGPT (ALT): 23 U/L (ref 12–78)
SODIUM: 140 mmol/L (ref 136–145)
TOTAL PROTEIN: 7.8 g/dL (ref 6.4–8.2)

## 2013-08-09 LAB — COMPREHENSIVE METABOLIC PANEL
ALBUMIN: 4.2 g/dL (ref 3.4–5.0)
ANION GAP: 12 (ref 7–16)
Alkaline Phosphatase: 55 U/L
BILIRUBIN TOTAL: 0.6 mg/dL (ref 0.2–1.0)
BUN: 24 mg/dL — ABNORMAL HIGH (ref 7–18)
CALCIUM: 9.8 mg/dL (ref 8.5–10.1)
CHLORIDE: 101 mmol/L (ref 98–107)
CO2: 25 mmol/L (ref 21–32)
Creatinine: 1.41 mg/dL — ABNORMAL HIGH (ref 0.60–1.30)
EGFR (African American): 40 — ABNORMAL LOW
EGFR (Non-African Amer.): 34 — ABNORMAL LOW
Glucose: 103 mg/dL — ABNORMAL HIGH (ref 65–99)
Osmolality: 280 (ref 275–301)
Potassium: 3.8 mmol/L (ref 3.5–5.1)
SGOT(AST): 20 U/L (ref 15–37)
SGPT (ALT): 20 U/L
SODIUM: 138 mmol/L (ref 136–145)
Total Protein: 8 g/dL (ref 6.4–8.2)

## 2013-08-09 LAB — CBC CANCER CENTER
BASOS PCT: 0.4 %
Basophil #: 0.1 x10 3/mm (ref 0.0–0.1)
Eosinophil #: 0.3 x10 3/mm (ref 0.0–0.7)
Eosinophil %: 1.9 %
HCT: 42.2 % (ref 35.0–47.0)
HGB: 13.9 g/dL (ref 12.0–16.0)
LYMPHS ABS: 10.9 x10 3/mm — AB (ref 1.0–3.6)
Lymphocyte %: 70.6 %
MCH: 29.6 pg (ref 26.0–34.0)
MCHC: 32.9 g/dL (ref 32.0–36.0)
MCV: 90 fL (ref 80–100)
MONOS PCT: 6.4 %
Monocyte #: 1 x10 3/mm — ABNORMAL HIGH (ref 0.2–0.9)
NEUTROS ABS: 3.2 x10 3/mm (ref 1.4–6.5)
Neutrophil %: 20.7 %
PLATELETS: 169 x10 3/mm (ref 150–440)
RBC: 4.7 10*6/uL (ref 3.80–5.20)
RDW: 14.2 % (ref 11.5–14.5)
WBC: 15.4 x10 3/mm — AB (ref 3.6–11.0)

## 2013-08-09 LAB — LACTATE DEHYDROGENASE: LDH: 326 U/L — AB (ref 81–246)

## 2013-08-19 ENCOUNTER — Ambulatory Visit: Payer: Self-pay | Admitting: Oncology

## 2013-09-05 LAB — CBC CANCER CENTER
Bands: 1 %
Eosinophil: 2 %
HCT: 39.1 % (ref 35.0–47.0)
HGB: 12.9 g/dL (ref 12.0–16.0)
Lymphocytes: 57 %
MCH: 29.8 pg (ref 26.0–34.0)
MCHC: 32.9 g/dL (ref 32.0–36.0)
MCV: 91 fL (ref 80–100)
Monocytes: 3 %
Other Cells Blood: 5 %
Platelet: 146 x10 3/mm — ABNORMAL LOW (ref 150–440)
RBC: 4.32 10*6/uL (ref 3.80–5.20)
RDW: 14 % (ref 11.5–14.5)
Segmented Neutrophils: 22 %
Variant Lymphocyte: 10 %
WBC: 12 x10 3/mm — ABNORMAL HIGH (ref 3.6–11.0)

## 2013-09-05 LAB — URINALYSIS, COMPLETE
BLOOD: NEGATIVE
Bacteria: NONE SEEN
Bilirubin,UR: NEGATIVE
GLUCOSE, UR: NEGATIVE mg/dL (ref 0–75)
KETONE: NEGATIVE
LEUKOCYTE ESTERASE: NEGATIVE
NITRITE: NEGATIVE
PROTEIN: NEGATIVE
Ph: 5 (ref 4.5–8.0)
RBC,UR: 1 /HPF (ref 0–5)
Specific Gravity: 1.014 (ref 1.003–1.030)
Squamous Epithelial: <1
WBC UR: 6 /HPF (ref 0–5)

## 2013-09-05 LAB — COMPREHENSIVE METABOLIC PANEL
ALBUMIN: 4 g/dL (ref 3.4–5.0)
ALK PHOS: 66 U/L
ANION GAP: 12 (ref 7–16)
AST: 25 U/L (ref 15–37)
BILIRUBIN TOTAL: 0.8 mg/dL (ref 0.2–1.0)
BUN: 16 mg/dL (ref 7–18)
CHLORIDE: 102 mmol/L (ref 98–107)
CREATININE: 1.15 mg/dL (ref 0.60–1.30)
Calcium, Total: 9.3 mg/dL (ref 8.5–10.1)
Co2: 26 mmol/L (ref 21–32)
EGFR (Non-African Amer.): 44 — ABNORMAL LOW
GFR CALC AF AMER: 51 — AB
Glucose: 102 mg/dL — ABNORMAL HIGH (ref 65–99)
Osmolality: 281 (ref 275–301)
POTASSIUM: 3.8 mmol/L (ref 3.5–5.1)
SGPT (ALT): 24 U/L
SODIUM: 140 mmol/L (ref 136–145)
TOTAL PROTEIN: 7.4 g/dL (ref 6.4–8.2)

## 2013-09-05 LAB — LACTATE DEHYDROGENASE: LDH: 393 U/L — ABNORMAL HIGH (ref 81–246)

## 2013-09-07 LAB — URINE CULTURE

## 2013-09-14 LAB — COMPREHENSIVE METABOLIC PANEL
ANION GAP: 9 (ref 7–16)
Albumin: 3.7 g/dL (ref 3.4–5.0)
Alkaline Phosphatase: 61 U/L
BUN: 24 mg/dL — ABNORMAL HIGH (ref 7–18)
Bilirubin,Total: 0.6 mg/dL (ref 0.2–1.0)
CO2: 27 mmol/L (ref 21–32)
Calcium, Total: 9.3 mg/dL (ref 8.5–10.1)
Chloride: 102 mmol/L (ref 98–107)
Creatinine: 1.37 mg/dL — ABNORMAL HIGH (ref 0.60–1.30)
EGFR (African American): 41 — ABNORMAL LOW
EGFR (Non-African Amer.): 36 — ABNORMAL LOW
GLUCOSE: 112 mg/dL — AB (ref 65–99)
OSMOLALITY: 280 (ref 275–301)
POTASSIUM: 4 mmol/L (ref 3.5–5.1)
SGOT(AST): 23 U/L (ref 15–37)
SGPT (ALT): 20 U/L
SODIUM: 138 mmol/L (ref 136–145)
TOTAL PROTEIN: 7.2 g/dL (ref 6.4–8.2)

## 2013-09-14 LAB — CBC CANCER CENTER
Basophil #: 0.1 x10 3/mm (ref 0.0–0.1)
Basophil %: 0.7 %
EOS ABS: 0.2 x10 3/mm (ref 0.0–0.7)
EOS PCT: 1.5 %
HCT: 38 % (ref 35.0–47.0)
HGB: 12.4 g/dL (ref 12.0–16.0)
LYMPHS PCT: 75.6 %
Lymphocyte #: 9.8 x10 3/mm — ABNORMAL HIGH (ref 1.0–3.6)
MCH: 29.8 pg (ref 26.0–34.0)
MCHC: 32.7 g/dL (ref 32.0–36.0)
MCV: 91 fL (ref 80–100)
Monocyte #: 0.9 x10 3/mm (ref 0.2–0.9)
Monocyte %: 6.6 %
NEUTROS PCT: 15.6 %
Neutrophil #: 2 x10 3/mm (ref 1.4–6.5)
PLATELETS: 134 x10 3/mm — AB (ref 150–440)
RBC: 4.18 10*6/uL (ref 3.80–5.20)
RDW: 13.8 % (ref 11.5–14.5)
WBC: 12.9 x10 3/mm — AB (ref 3.6–11.0)

## 2013-09-14 LAB — LACTATE DEHYDROGENASE: LDH: 373 U/L — ABNORMAL HIGH (ref 81–246)

## 2013-09-19 ENCOUNTER — Ambulatory Visit: Payer: Self-pay | Admitting: Oncology

## 2013-10-26 ENCOUNTER — Ambulatory Visit: Payer: Self-pay | Admitting: Oncology

## 2013-10-26 LAB — CBC CANCER CENTER
Bands: 1 %
Basophil: 1 %
Eosinophil: 2 %
HCT: 38.6 % (ref 35.0–47.0)
HGB: 12.3 g/dL (ref 12.0–16.0)
Lymphocytes: 63 %
MCH: 28.8 pg (ref 26.0–34.0)
MCHC: 31.8 g/dL — ABNORMAL LOW (ref 32.0–36.0)
MCV: 91 fL (ref 80–100)
MONOS PCT: 5 %
Metamyelocyte: 1 %
Other Cells Blood: 4 %
PLATELETS: 148 x10 3/mm — AB (ref 150–440)
RBC: 4.26 10*6/uL (ref 3.80–5.20)
RDW: 15.2 % — AB (ref 11.5–14.5)
Segmented Neutrophils: 11 %
VARIANT LYMPHOCYTE - H4-RLYMPH: 12 %
WBC: 25.4 x10 3/mm — AB (ref 3.6–11.0)

## 2013-10-26 LAB — COMPREHENSIVE METABOLIC PANEL
ALK PHOS: 74 U/L
ALT: 21 U/L
Albumin: 3.9 g/dL (ref 3.4–5.0)
Anion Gap: 11 (ref 7–16)
BUN: 19 mg/dL — ABNORMAL HIGH (ref 7–18)
Bilirubin,Total: 0.8 mg/dL (ref 0.2–1.0)
CREATININE: 1.11 mg/dL (ref 0.60–1.30)
Calcium, Total: 9.4 mg/dL (ref 8.5–10.1)
Chloride: 101 mmol/L (ref 98–107)
Co2: 25 mmol/L (ref 21–32)
EGFR (African American): >60
EGFR (Non-African Amer.): 50 — ABNORMAL LOW
GLUCOSE: 101 mg/dL — AB (ref 65–99)
Osmolality: 276 (ref 275–301)
Potassium: 4 mmol/L (ref 3.5–5.1)
SGOT(AST): 29 U/L (ref 15–37)
SODIUM: 137 mmol/L (ref 136–145)
Total Protein: 7.1 g/dL (ref 6.4–8.2)

## 2013-10-26 LAB — LACTATE DEHYDROGENASE: LDH: 486 U/L — ABNORMAL HIGH (ref 81–246)

## 2013-11-19 ENCOUNTER — Ambulatory Visit: Payer: Self-pay | Admitting: Oncology

## 2013-12-07 LAB — COMPREHENSIVE METABOLIC PANEL
ALBUMIN: 4 g/dL (ref 3.4–5.0)
ALK PHOS: 71 U/L
AST: 23 U/L (ref 15–37)
Anion Gap: 10 (ref 7–16)
BILIRUBIN TOTAL: 0.7 mg/dL (ref 0.2–1.0)
BUN: 29 mg/dL — AB (ref 7–18)
CALCIUM: 9.5 mg/dL (ref 8.5–10.1)
CO2: 26 mmol/L (ref 21–32)
Chloride: 102 mmol/L (ref 98–107)
Creatinine: 1.24 mg/dL (ref 0.60–1.30)
GFR CALC AF AMER: 53 — AB
GFR CALC NON AF AMER: 44 — AB
Glucose: 103 mg/dL — ABNORMAL HIGH (ref 65–99)
OSMOLALITY: 282 (ref 275–301)
POTASSIUM: 3.9 mmol/L (ref 3.5–5.1)
SGPT (ALT): 22 U/L
SODIUM: 138 mmol/L (ref 136–145)
TOTAL PROTEIN: 7.3 g/dL (ref 6.4–8.2)

## 2013-12-07 LAB — CBC CANCER CENTER
Eosinophil: 1 %
HCT: 35.4 % (ref 35.0–47.0)
HGB: 11.2 g/dL — ABNORMAL LOW (ref 12.0–16.0)
Lymphocytes: 73 %
MCH: 28.6 pg (ref 26.0–34.0)
MCHC: 31.8 g/dL — AB (ref 32.0–36.0)
MCV: 90 fL (ref 80–100)
MONOS PCT: 2 %
PLATELETS: 130 x10 3/mm — AB (ref 150–440)
RBC: 3.93 10*6/uL (ref 3.80–5.20)
RDW: 15.9 % — ABNORMAL HIGH (ref 11.5–14.5)
SEGMENTED NEUTROPHILS: 4 %
VARIANT LYMPHOCYTE - H4-RLYMPH: 20 %
WBC: 27.6 x10 3/mm — ABNORMAL HIGH (ref 3.6–11.0)

## 2013-12-07 LAB — LACTATE DEHYDROGENASE: LDH: 465 U/L — ABNORMAL HIGH (ref 81–246)

## 2013-12-19 ENCOUNTER — Ambulatory Visit: Payer: Self-pay | Admitting: Oncology

## 2014-03-29 ENCOUNTER — Ambulatory Visit
Admit: 2014-03-29 | Disposition: A | Discharge status: Home / Self Care | Payer: Self-pay | Attending: Oncology | Admitting: Oncology

## 2014-04-20 ENCOUNTER — Ambulatory Visit
Admit: 2014-04-20 | Disposition: A | Discharge status: Home / Self Care | Payer: Self-pay | Attending: Oncology | Admitting: Oncology

## 2014-04-24 LAB — CBC CANCER CENTER
BASOS PCT: 0.2 %
Basophil #: 0.1 x10 3/mm (ref 0.0–0.1)
EOS PCT: 0.3 %
Eosinophil #: 0.2 x10 3/mm (ref 0.0–0.7)
HCT: 27.2 % — AB (ref 35.0–47.0)
HGB: 8.5 g/dL — AB (ref 12.0–16.0)
Lymphocyte #: 54.1 x10 3/mm — ABNORMAL HIGH (ref 1.0–3.6)
Lymphocyte %: 90.1 %
MCH: 31.5 pg (ref 26.0–34.0)
MCHC: 31.4 g/dL — ABNORMAL LOW (ref 32.0–36.0)
MCV: 101 fL — ABNORMAL HIGH (ref 80–100)
MONOS PCT: 2.8 %
Monocyte #: 1.7 x10 3/mm — ABNORMAL HIGH (ref 0.2–0.9)
Neutrophil #: 4 x10 3/mm (ref 1.4–6.5)
Neutrophil %: 6.6 %
Platelet: 106 x10 3/mm — ABNORMAL LOW (ref 150–440)
RBC: 2.7 10*6/uL — ABNORMAL LOW (ref 3.80–5.20)
RDW: 16.9 % — ABNORMAL HIGH (ref 11.5–14.5)
WBC: 60.1 x10 3/mm — ABNORMAL HIGH (ref 3.6–11.0)

## 2014-04-24 LAB — COMPREHENSIVE METABOLIC PANEL
ALT: 14 U/L
Albumin: 4.3 g/dL
Alkaline Phosphatase: 49 U/L
Anion Gap: 10 (ref 7–16)
BUN: 19 mg/dL
Bilirubin,Total: 1.5 mg/dL — ABNORMAL HIGH
CO2: 24 mmol/L
Calcium, Total: 8.9 mg/dL
Chloride: 98 mmol/L — ABNORMAL LOW
Creatinine: 1.03 mg/dL — ABNORMAL HIGH
EGFR (African American): 58 — ABNORMAL LOW
GFR CALC NON AF AMER: 50 — AB
Glucose: 100 mg/dL — ABNORMAL HIGH
Potassium: 3.1 mmol/L — ABNORMAL LOW
SGOT(AST): 21 U/L
Sodium: 132 mmol/L — ABNORMAL LOW
Total Protein: 7.1 g/dL

## 2014-05-08 LAB — COMPREHENSIVE METABOLIC PANEL
ALBUMIN: 4.2 g/dL
AST: 24 U/L
Alkaline Phosphatase: 41 U/L
Anion Gap: 5 — ABNORMAL LOW (ref 7–16)
BILIRUBIN TOTAL: 1.3 mg/dL — AB
BUN: 18 mg/dL
CHLORIDE: 102 mmol/L
CREATININE: 1.13 mg/dL — AB
Calcium, Total: 9.1 mg/dL
Co2: 26 mmol/L
EGFR (African American): 52 — ABNORMAL LOW
EGFR (Non-African Amer.): 45 — ABNORMAL LOW
Glucose: 82 mg/dL
Potassium: 3.6 mmol/L
SGPT (ALT): 13 U/L — ABNORMAL LOW
SODIUM: 133 mmol/L — AB
TOTAL PROTEIN: 6.7 g/dL

## 2014-05-08 LAB — CBC CANCER CENTER
BASOS PCT: 0.3 %
Basophil #: 0.1 x10 3/mm (ref 0.0–0.1)
EOS ABS: 0.1 x10 3/mm (ref 0.0–0.7)
Eosinophil %: 0.3 %
HCT: 30.4 % — ABNORMAL LOW (ref 35.0–47.0)
HGB: 9.4 g/dL — ABNORMAL LOW (ref 12.0–16.0)
Lymphocyte #: 34.6 x10 3/mm — ABNORMAL HIGH (ref 1.0–3.6)
Lymphocyte %: 84.5 %
MCH: 31 pg (ref 26.0–34.0)
MCHC: 31 g/dL — ABNORMAL LOW (ref 32.0–36.0)
MCV: 100 fL (ref 80–100)
Monocyte #: 1.6 x10 3/mm — ABNORMAL HIGH (ref 0.2–0.9)
Monocyte %: 3.9 %
NEUTROS ABS: 4.5 x10 3/mm (ref 1.4–6.5)
NEUTROS PCT: 11 %
PLATELETS: 128 x10 3/mm — AB (ref 150–440)
RBC: 3.04 10*6/uL — ABNORMAL LOW (ref 3.80–5.20)
RDW: 16.6 % — AB (ref 11.5–14.5)
WBC: 40.9 x10 3/mm — ABNORMAL HIGH (ref 3.6–11.0)

## 2014-05-11 NOTE — Op Note (Signed)
PATIENT NAME:  Heather [name], Heather [name] MR#:  [id number] DATE OF BIRTH:  06/22/1928  DATE OF PROCEDURE:  03/29/2012  LOCATION:  [facility]  PREOPERATIVE DIAGNOSIS:  Visually significant cataract of the left eye.   POSTOPERATIVE DIAGNOSIS:  Visually significant cataract of the left eye.   OPERATIVE PROCEDURE:  Cataract extraction by phacoemulsification with implant of intraocular lens to left eye.   SURGEON:  Birder Robson, MD  ANESTHESIA:  1. Managed anesthesia care.  2. Topical tetracaine drops followed by 2% Xylocaine jelly applied in the preoperative holding area.   COMPLICATIONS:  None.   TECHNIQUE:  Stop-and-chop.   DESCRIPTION OF PROCEDURE:  The patient was examined and consented in the preoperative holding area where the aforementioned topical anesthesia was applied to the left eye and then brought back to the operating room where the left eye was prepped and draped in the usual sterile ophthalmic fashion and a lid speculum was placed. A paracentesis was created with the side port blade and the anterior chamber was filled with viscoelastic. A near clear corneal incision was performed with the steel keratome. A continuous curvilinear capsulorrhexis was performed with a cystotome followed by the capsulorrhexis forceps. Hydrodissection and hydrodelineation were carried out with BSS on a blunt cannula. The lens was removed in a stop-and-chop technique and the remaining cortical material was removed with the irrigation-aspiration handpiece. The capsular bag was inflated with viscoelastic and the Tecnis ZCB00 16.5-diopter lens, serial number [id number], was placed in the capsular bag without complication. The remaining viscoelastic was removed from the eye with the irrigation-aspiration handpiece. The wounds were hydrated. The anterior chamber was flushed with Miostat and the eye was inflated to physiologic pressure. 0.1 mL of cefuroxime concentration 10 mg/mL was placed in the anterior  chamber. The wounds were found to be water tight. The eye was dressed with Vigamox and Combigan. The patient was given protective glasses to wear throughout the day and a shield with which to sleep tonight. The patient was also given drops with which to begin a drop regimen today and will follow-up with me in one day.    ____________________________ Livingston Diones. Porfilio, MD wlp:si D: 03/29/2012 21:54:29 ET T: 03/29/2012 23:51:59 ET JOB#: [id number]  cc: William L. Porfilio, MD, <Dictator> Livingston Diones PORFILIO MD ELECTRONICALLY SIGNED 04/01/2012 17:10

## 2014-05-13 NOTE — Op Note (Signed)
PATIENT NAME:  Heather [name], Heather [name] MR#:  [id number] DATE OF BIRTH:  05-04-1928  DATE OF PROCEDURE:  05/04/2011  PREOPERATIVE DIAGNOSIS: Generalized lymphadenopathy.   POSTOPERATIVE DIAGNOSIS: Generalized lymphadenopathy.   PROCEDURE: Excision left cervical nodes, deep.   SURGEON: S.G. Jamal Collin, MD  ANESTHESIA: Local with a mixture of 0.5% Marcaine and 1% Xylocaine.   COMPLICATIONS: None.   ESTIMATED BLOOD LOSS: Minimal.   DRAINS: None.   DESCRIPTION OF PROCEDURE: This patient had at least two palpable nodes. They were both visible and palpable located in the posterior aspect of the left side of the neck and also another node along the anterior border of the sternomastoid in the midportion of the neck. Given the fact that she had multiple nodes in the posterior aspect, this area was operated on first. Patient was placed in supine position on the operating table and the neck was placed with a slight a tilt to the right side. The area of concern along the posterior aspect was satisfactorily exposed, prepped and draped out as a sterile field. A local anesthetic was instilled with anesthesia monitoring. Total of 10 mL was used. Skin incision was made overlying the palpable nodes after allowing time for the anesthetic to work. The incision was deepened through to the deep tissue and there were three nodes identified. Two of them were about 1.5 cm in size and the other a little less than 1 cm. These were sequentially removed and sent to pathology for immediate processing for possible lymphoma work-up. Hemostasis was obtained with cautery. The deeper tissues were closed with 3-0 Vicryl, the skin closed with subcuticular 4-0 Vicryl, covered with Dermabond. Procedure was well tolerated.      She was subsequently returned to recovery room in stable condition.  ____________________________ S.Robinette Haines, MD sgs:cms D: 05/04/2011 09:28:52 ET T: 05/04/2011 11:38:31 ET JOB#: [id number]  cc: Synthia Innocent. Jamal Collin, MD,  <Dictator> [facility] Robinette Haines MD ELECTRONICALLY SIGNED 05/07/2011 9:30

## 2014-06-01 ENCOUNTER — Other Ambulatory Visit: Payer: Self-pay | Admitting: *Deleted

## 2014-06-01 DIAGNOSIS — C9110 Chronic lymphocytic leukemia of B-cell type not having achieved remission: Secondary | ICD-10-CM

## 2014-06-05 ENCOUNTER — Inpatient Hospital Stay: Payer: Medicare Other | Attending: Oncology | Admitting: Oncology

## 2014-06-05 ENCOUNTER — Inpatient Hospital Stay: Payer: Medicare Other

## 2014-06-05 VITALS — BP 147/76 | HR 67 | Temp 95.7°F | Wt 143.3 lb

## 2014-06-05 DIAGNOSIS — Z79899 Other long term (current) drug therapy: Secondary | ICD-10-CM

## 2014-06-05 DIAGNOSIS — E039 Hypothyroidism, unspecified: Secondary | ICD-10-CM | POA: Diagnosis not present

## 2014-06-05 DIAGNOSIS — E780 Pure hypercholesterolemia: Secondary | ICD-10-CM | POA: Diagnosis not present

## 2014-06-05 DIAGNOSIS — C9110 Chronic lymphocytic leukemia of B-cell type not having achieved remission: Secondary | ICD-10-CM

## 2014-06-05 DIAGNOSIS — H9190 Unspecified hearing loss, unspecified ear: Secondary | ICD-10-CM | POA: Diagnosis not present

## 2014-06-05 DIAGNOSIS — D649 Anemia, unspecified: Secondary | ICD-10-CM | POA: Diagnosis not present

## 2014-06-05 DIAGNOSIS — D696 Thrombocytopenia, unspecified: Secondary | ICD-10-CM | POA: Diagnosis not present

## 2014-06-05 DIAGNOSIS — R160 Hepatomegaly, not elsewhere classified: Secondary | ICD-10-CM | POA: Insufficient documentation

## 2014-06-05 DIAGNOSIS — I10 Essential (primary) hypertension: Secondary | ICD-10-CM | POA: Diagnosis not present

## 2014-06-05 LAB — CBC WITH DIFFERENTIAL/PLATELET
Basophils Absolute: 0.1 10*3/uL (ref 0–0.1)
Basophils Relative: 0 %
EOS PCT: 1 %
Eosinophils Absolute: 0.1 10*3/uL (ref 0–0.7)
HEMATOCRIT: 34.5 % — AB (ref 35.0–47.0)
Hemoglobin: 10.9 g/dL — ABNORMAL LOW (ref 12.0–16.0)
LYMPHS ABS: 13.9 10*3/uL — AB (ref 1.0–3.6)
LYMPHS PCT: 82 %
MCH: 29.2 pg (ref 26.0–34.0)
MCHC: 31.5 g/dL — ABNORMAL LOW (ref 32.0–36.0)
MCV: 92.8 fL (ref 80.0–100.0)
MONO ABS: 0.7 10*3/uL (ref 0.2–0.9)
Monocytes Relative: 4 %
Neutro Abs: 2.2 10*3/uL (ref 1.4–6.5)
Neutrophils Relative %: 13 %
Platelets: 112 10*3/uL — ABNORMAL LOW (ref 150–440)
RBC: 3.72 MIL/uL — AB (ref 3.80–5.20)
RDW: 16.6 % — ABNORMAL HIGH (ref 11.5–14.5)
WBC: 17 10*3/uL — AB (ref 3.6–11.0)

## 2014-06-05 LAB — COMPREHENSIVE METABOLIC PANEL
ALBUMIN: 4.2 g/dL (ref 3.5–5.0)
ALT: 19 U/L (ref 14–54)
AST: 25 U/L (ref 15–41)
Alkaline Phosphatase: 44 U/L (ref 38–126)
Anion gap: 8 (ref 5–15)
BILIRUBIN TOTAL: 1 mg/dL (ref 0.3–1.2)
BUN: 21 mg/dL — ABNORMAL HIGH (ref 6–20)
CHLORIDE: 102 mmol/L (ref 101–111)
CO2: 26 mmol/L (ref 22–32)
CREATININE: 1.01 mg/dL — AB (ref 0.44–1.00)
Calcium: 9.3 mg/dL (ref 8.9–10.3)
GFR calc non Af Amer: 50 mL/min — ABNORMAL LOW (ref >60)
GFR, EST AFRICAN AMERICAN: 58 mL/min — AB (ref >60)
Glucose, Bld: 95 mg/dL (ref 65–99)
POTASSIUM: 3.4 mmol/L — AB (ref 3.5–5.1)
Sodium: 136 mmol/L (ref 135–145)
Total Protein: 6.4 g/dL — ABNORMAL LOW (ref 6.5–8.1)

## 2014-06-06 ENCOUNTER — Encounter: Payer: Self-pay | Admitting: Oncology

## 2014-06-06 DIAGNOSIS — C9110 Chronic lymphocytic leukemia of B-cell type not having achieved remission: Secondary | ICD-10-CM | POA: Insufficient documentation

## 2014-06-06 HISTORY — DX: Chronic lymphocytic leukemia of B-cell type not having achieved remission: C91.10

## 2014-06-06 NOTE — Progress Notes (Signed)
Heather [name] @ [facility] [phone]  Fax:(336) [place]: [birth year]  MR#: [id number]  MOQ#:[id number]  Patient Care Team: Albina Billet, MD as PCP - General (Internal Medicine)  CHIEF COMPLAINT:  Chief Complaint  Patient presents with  . Follow-up    Oncology History   1. Ultrasound revealed abdominal lymphadenopathy(March, 2013) 2. Biopsy as well as peripheral blood be suggestive of chronic lymphocytic leukemia (April, 2013) 3. Started on bendamustin and Rituxan from May of 2013 4. Has finished her 6 cycles of chemotherapy with Rituxan and bendamustine (October 26, 2011) 5.recurrent and progressive disease with progressive anemia.  Patient was started on IBRUTANIB (March, 2016)  taking 140 mg 3 pills a day     Chronic lymphocytic leukemia   06/06/2014 Initial Diagnosis Chronic lymphocytic leukemia    No flowsheet data found.  INTERVAL HISTORY: 81 year old lady with a history of chronic lymphocytic leukemia recently was started on Ibrutanib.  Patient is tolerating treatment very well be slightly reduced dose.  White count is decreased to 17,000 with decrease in lymphocyte overall patient is feeling better.  According to her there are more good days and bad days.  No diarrhea.  No rash.  No nausea.  No vomiting.  REVIEW OF SYSTEMS:   GENERAL:  Feels good.  Active.  No fevers, sweats or weight loss. PERFORMANCE STATUS (ECOG): 01 HEENT:  No visual changes, runny nose, sore throat, mouth sores or tenderness. Lungs: No shortness of breath or cough.  No hemoptysis. Cardiac:  No chest pain, palpitations, orthopnea, or PND. GI:  No nausea, vomiting, diarrhea, constipation, melena or hematochezia. GU:  No urgency, frequency, dysuria, or hematuria. Musculoskeletal:  No back pain.  No joint pain.  No muscle tenderness. Extremities:  No pain or swelling. Skin:  No rashes or skin changes. Neuro:  No headache, numbness or weakness, balance or coordination  issues. Endocrine:  No diabetes, thyroid issues, hot flashes or night sweats. Psych:  No mood changes, depression or anxiety. Pain:  No focal pain. Review of systems:  All other systems reviewed and found to be negative.  PAST MEDICAL HISTORY: Past Medical History  Diagnosis Date  . Chronic lymphocytic leukemia 06/06/2014   Significant History/PMH:   Spleenomegaly:    Hepatomegaly:    Palpitations:    Hypertension:    Anemia:    Hypothyroidism:    Hard of Hearing:    htn:    high cholesterol:   Smoking History: Smoking History Never Smoked.(1)  PFSH: Comments: No family history of colorectal cancer, breast cancer, or ovarian cancer.  Comments: Does not smoke.  Does not drink.  Lives by herself  Additional Past Medical and Surgical History: Hypothyroidism.    Hyperlipidemia       ADVANCED DIRECTIVES: Patient does have advance care directive  HEALTH MAINTENANCE: History  Substance Use Topics  . Smoking status: Never Smoker   . Smokeless tobacco: Not on file  . Alcohol Use: Not on file     Colonoscopy:  PAP:  Bone density:  Lipid panel:  No Known Allergies  Current Outpatient Prescriptions  Medication Sig Dispense Refill  . aspirin EC 81 MG tablet Take 81 mg by mouth daily.    Marland Kitchen atorvastatin (LIPITOR) 20 MG tablet     . diltiazem (CARDIZEM CD) 240 MG 24 hr capsule     . levothyroxine (SYNTHROID, LEVOTHROID) 75 MCG tablet     . triamterene-hydrochlorothiazide (DYAZIDE) 37.5-25 MG per capsule  No current facility-administered medications for this visit.    OBJECTIVE:  Filed Vitals:   06/05/14 1639  BP: 147/76  Pulse: 67  Temp: 95.7 F (35.4 C)     There is no height on file to calculate BMI.    ECOG FS:1 - Symptomatic but completely ambulatory  PHYSICAL EXAM:  General status: Performance status is good.  Patient has not lost significant weight HEENT: No evidence of stomatitis.   Sclera and conjunctivae :: No jaundice.   pale looking  . Lungs: Air  entry equal on both sides.  No rhonchi.  No rales.  Cardiac: Heart sounds are normal.  No pericardial rub.  No murmur. Lymphatic system: Cervical, axillary, inguinal, lymph nodes not palpable GI: Abdomen is soft.  No ascites.  Liver spleen not palpable.  No tenderness.  Bowel sounds are within normal limit Lower extremity: No edema Neurological system: Higher functions, cranial nerves intact No evidence of peripheral neuropathy. Skin: No rash.  No ecchymosis..  Target lesion:   LAB RESULTS:  Appointment on 06/05/2014  Component Date Value Ref Range Status  . WBC 06/05/2014 17.0* 3.6 - 11.0 K/uL Final  . RBC 06/05/2014 3.72* 3.80 - 5.20 MIL/uL Final  . Hemoglobin 06/05/2014 10.9* 12.0 - 16.0 g/dL Final  . HCT 06/05/2014 34.5* 35.0 - 47.0 % Final  . MCV 06/05/2014 92.8  80.0 - 100.0 fL Final  . MCH 06/05/2014 29.2  26.0 - 34.0 pg Final  . MCHC 06/05/2014 31.5* 32.0 - 36.0 g/dL Final  . RDW 06/05/2014 16.6* 11.5 - 14.5 % Final  . Platelets 06/05/2014 112* 150 - 440 K/uL Final  . Neutrophils Relative % 06/05/2014 13   Final  . Neutro Abs 06/05/2014 2.2  1.4 - 6.5 K/uL Final  . Lymphocytes Relative 06/05/2014 82   Final  . Lymphs Abs 06/05/2014 13.9* 1.0 - 3.6 K/uL Final  . Monocytes Relative 06/05/2014 4   Final  . Monocytes Absolute 06/05/2014 0.7  0.2 - 0.9 K/uL Final  . Eosinophils Relative 06/05/2014 1   Final  . Eosinophils Absolute 06/05/2014 0.1  0 - 0.7 K/uL Final  . Basophils Relative 06/05/2014 0   Final  . Basophils Absolute 06/05/2014 0.1  0 - 0.1 K/uL Final  . Sodium 06/05/2014 136  135 - 145 mmol/L Final  . Potassium 06/05/2014 3.4* 3.5 - 5.1 mmol/L Final  . Chloride 06/05/2014 102  101 - 111 mmol/L Final  . CO2 06/05/2014 26  22 - 32 mmol/L Final  . Glucose, Bld 06/05/2014 95  65 - 99 mg/dL Final  . BUN 06/05/2014 21* 6 - 20 mg/dL Final  . Creatinine, Ser 06/05/2014 1.01* 0.44 - 1.00 mg/dL Final  . Calcium 06/05/2014 9.3  8.9 - 10.3 mg/dL Final  .  Total Protein 06/05/2014 6.4* 6.5 - 8.1 g/dL Final  . Albumin 06/05/2014 4.2  3.5 - 5.0 g/dL Final  . AST 06/05/2014 25  15 - 41 U/L Final  . ALT 06/05/2014 19  14 - 54 U/L Final  . Alkaline Phosphatase 06/05/2014 44  38 - 126 U/L Final  . Total Bilirubin 06/05/2014 1.0  0.3 - 1.2 mg/dL Final  . GFR calc non Af Amer 06/05/2014 50* >60 mL/min Final  . GFR calc Af Amer 06/05/2014 58* >60 mL/min Final   Comment: (NOTE) The eGFR has been calculated using the CKD EPI equation. This calculation has not been validated in all clinical situations. eGFR's persistently <60 mL/min signify possible Chronic Kidney Disease.   Georgiann Hahn  gap 06/05/2014 8  5 - 15 Final      STUDIES: No results found.  ASSESSMENT: 81 year old lady with chronic lymphocytic leukemia now on Ibrutanib.  Tolerating treatment very well be slightly reduced dose. Anemia Mild thrombocytopenia  MEDICAL DECISION MAKING:  All the lab data has been reviewed.  Side effect profile is been reviewed.  Will continue Ibrutanib.  If needed anemia workup will be ordered.  Patient is responding to Ibrutanib by clinical criteria  Patient expressed understanding and was in agreement with this plan. She also understands that She can call clinic at any time with any questions, concerns, or complaints.    No matching staging information was found for the patient.  Forest Gleason, MD   06/06/2014 8:02 AM

## 2014-07-10 ENCOUNTER — Inpatient Hospital Stay: Payer: Medicare Other

## 2014-07-10 ENCOUNTER — Inpatient Hospital Stay: Payer: Medicare Other | Attending: Oncology | Admitting: Oncology

## 2014-07-10 VITALS — BP 138/71 | HR 69 | Temp 97.7°F | Wt 147.7 lb

## 2014-07-10 DIAGNOSIS — C9110 Chronic lymphocytic leukemia of B-cell type not having achieved remission: Secondary | ICD-10-CM | POA: Diagnosis present

## 2014-07-10 DIAGNOSIS — E039 Hypothyroidism, unspecified: Secondary | ICD-10-CM | POA: Insufficient documentation

## 2014-07-10 DIAGNOSIS — I10 Essential (primary) hypertension: Secondary | ICD-10-CM | POA: Insufficient documentation

## 2014-07-10 DIAGNOSIS — H9190 Unspecified hearing loss, unspecified ear: Secondary | ICD-10-CM | POA: Diagnosis not present

## 2014-07-10 DIAGNOSIS — Z7982 Long term (current) use of aspirin: Secondary | ICD-10-CM | POA: Diagnosis not present

## 2014-07-10 DIAGNOSIS — Z79899 Other long term (current) drug therapy: Secondary | ICD-10-CM | POA: Diagnosis not present

## 2014-07-10 DIAGNOSIS — R162 Hepatomegaly with splenomegaly, not elsewhere classified: Secondary | ICD-10-CM

## 2014-07-10 DIAGNOSIS — R002 Palpitations: Secondary | ICD-10-CM | POA: Diagnosis not present

## 2014-07-10 DIAGNOSIS — E785 Hyperlipidemia, unspecified: Secondary | ICD-10-CM | POA: Insufficient documentation

## 2014-07-10 DIAGNOSIS — D649 Anemia, unspecified: Secondary | ICD-10-CM | POA: Insufficient documentation

## 2014-07-10 LAB — COMPREHENSIVE METABOLIC PANEL
ALT: 18 U/L (ref 14–54)
AST: 26 U/L (ref 15–41)
Albumin: 4 g/dL (ref 3.5–5.0)
Alkaline Phosphatase: 47 U/L (ref 38–126)
Anion gap: 8 (ref 5–15)
BUN: 22 mg/dL — AB (ref 6–20)
CO2: 24 mmol/L (ref 22–32)
CREATININE: 1.09 mg/dL — AB (ref 0.44–1.00)
Calcium: 8.2 mg/dL — ABNORMAL LOW (ref 8.9–10.3)
Chloride: 98 mmol/L — ABNORMAL LOW (ref 101–111)
GFR, EST AFRICAN AMERICAN: 52 mL/min — AB (ref >60)
GFR, EST NON AFRICAN AMERICAN: 45 mL/min — AB (ref >60)
GLUCOSE: 115 mg/dL — AB (ref 65–99)
Potassium: 3.4 mmol/L — ABNORMAL LOW (ref 3.5–5.1)
Sodium: 130 mmol/L — ABNORMAL LOW (ref 135–145)
Total Bilirubin: 0.9 mg/dL (ref 0.3–1.2)
Total Protein: 6.6 g/dL (ref 6.5–8.1)

## 2014-07-10 LAB — CBC WITH DIFFERENTIAL/PLATELET
Basophils Absolute: 0 10*3/uL (ref 0–0.1)
Basophils Relative: 1 %
EOS ABS: 0 10*3/uL (ref 0–0.7)
Eosinophils Relative: 1 %
HCT: 38.3 % (ref 35.0–47.0)
Hemoglobin: 12.1 g/dL (ref 12.0–16.0)
Lymphocytes Relative: 73 %
Lymphs Abs: 4 10*3/uL — ABNORMAL HIGH (ref 1.0–3.6)
MCH: 27.1 pg (ref 26.0–34.0)
MCHC: 31.6 g/dL — ABNORMAL LOW (ref 32.0–36.0)
MCV: 85.7 fL (ref 80.0–100.0)
MONOS PCT: 11 %
Monocytes Absolute: 0.6 10*3/uL (ref 0.2–0.9)
NEUTROS ABS: 0.8 10*3/uL — AB (ref 1.4–6.5)
Neutrophils Relative %: 14 %
Platelets: 100 10*3/uL — ABNORMAL LOW (ref 150–440)
RBC: 4.47 MIL/uL (ref 3.80–5.20)
RDW: 16.3 % — ABNORMAL HIGH (ref 11.5–14.5)
WBC: 5.5 10*3/uL (ref 3.6–11.0)

## 2014-07-10 LAB — LACTATE DEHYDROGENASE: LDH: 195 U/L — ABNORMAL HIGH (ref 98–192)

## 2014-07-11 ENCOUNTER — Encounter: Payer: Self-pay | Admitting: Oncology

## 2014-07-11 NOTE — Progress Notes (Signed)
[place] @ [facility] [phone]  Fax:(336) [place]: 1928/02/11  MR#: [id number]  IRJ#:[id number]  Patient Care Team: Albina Billet, MD as PCP - General (Internal Medicine)  CHIEF COMPLAINT:  Chief Complaint  Patient presents with  . Follow-up    Oncology History   1. Ultrasound revealed abdominal lymphadenopathy(March, 2013) 2. Biopsy as well as peripheral blood be suggestive of chronic lymphocytic leukemia (April, 2013) 3. Started on bendamustin and Rituxan from May of 2013 4. Has finished her 6 cycles of chemotherapy with Rituxan and bendamustine (October 26, 2011) 5.recurrent and progressive disease with progressive anemia.  Patient was started on IBRUTANIB (March, 2016)  taking 140 mg 3 pills a day     Chronic lymphocytic leukemia   06/06/2014 Initial Diagnosis Chronic lymphocytic leukemia    No flowsheet data found.  INTERVAL HISTORY: 81 year old lady with a history of chronic lymphocytic leukemia recently was started on Ibrutanib.  Patient is tolerating treatment very well be slightly reduced dose.  White count is decreased to 17,000 with decrease in lymphocyte overall patient is feeling better.  According to her there are more good days and bad days.  No diarrhea.  No rash.  No nausea.  No vomiting. July 10, 2014 Patient is here for further follow-up and treatment consideration regarding chronic lymphocytic leukemia with recurrent disease.  Patient has been started on a bruit unable and taking 420 mg by mouth daily. Tolerating treatment very well.  However patient's neutrophil count has dropped to 800.  No chills.  No fever.  No soreness in the mouth.  No diarrhea.  REVIEW OF SYSTEMS:   GENERAL:  Feels good.  Active.  No fevers, sweats or weight loss. PERFORMANCE STATUS (ECOG): 01 HEENT:  No visual changes, runny nose, sore throat, mouth sores or tenderness. Lungs: No shortness of breath or cough.  No hemoptysis. Cardiac:  No chest pain,  palpitations, orthopnea, or PND. GI:  No nausea, vomiting, diarrhea, constipation, melena or hematochezia. GU:  No urgency, frequency, dysuria, or hematuria. Musculoskeletal:  No back pain.  No joint pain.  No muscle tenderness. Extremities:  No pain or swelling. Skin:  No rashes or skin changes. Neuro:  No headache, numbness or weakness, balance or coordination issues. Endocrine:  No diabetes, thyroid issues, hot flashes or night sweats. Psych:  No mood changes, depression or anxiety. Pain:  No focal pain. Review of systems:  All other systems reviewed and found to be negative.  PAST MEDICAL HISTORY: Past Medical History  Diagnosis Date  . Chronic lymphocytic leukemia 06/06/2014   Significant History/PMH:   Spleenomegaly:    Hepatomegaly:    Palpitations:    Hypertension:    Anemia:    Hypothyroidism:    Hard of Hearing:    htn:    high cholesterol:   Smoking History: Smoking History Never Smoked.(1)  PFSH: Comments: No family history of colorectal cancer, breast cancer, or ovarian cancer.  Comments: Does not smoke.  Does not drink.  Lives by herself  Additional Past Medical and Surgical History: Hypothyroidism.    Hyperlipidemia       ADVANCED DIRECTIVES: Patient does have advance care directive  HEALTH MAINTENANCE: History  Substance Use Topics  . Smoking status: Never Smoker   . Smokeless tobacco: Not on file  . Alcohol Use: Not on file     Colonoscopy:  PAP:  Bone density:  Lipid panel:  No Known Allergies  Current Outpatient Prescriptions  Medication Sig Dispense  Refill  . aspirin EC 81 MG tablet Take 81 mg by mouth daily.    Marland Kitchen atorvastatin (LIPITOR) 20 MG tablet     . diltiazem (CARDIZEM CD) 240 MG 24 hr capsule     . levothyroxine (SYNTHROID, LEVOTHROID) 75 MCG tablet     . triamterene-hydrochlorothiazide (DYAZIDE) 37.5-25 MG per capsule     . IMBRUVICA 140 MG capsul Take 280 mg by mouth daily.      No current facility-administered  medications for this visit.    OBJECTIVE:  Filed Vitals:   07/10/14 1505  BP: 138/71  Pulse: 69  Temp: 97.7 F (36.5 C)     There is no height on file to calculate BMI.    ECOG FS:1 - Symptomatic but completely ambulatory  PHYSICAL EXAM:  General status: Performance status is good.  Patient has not lost significant weight HEENT: No evidence of stomatitis.   Sclera and conjunctivae :: No jaundice.   pale looking . Lungs: Air  entry equal on both sides.  No rhonchi.  No rales.  Cardiac: Heart sounds are normal.  No pericardial rub.  No murmur. Lymphatic system: Cervical, axillary, inguinal, lymph nodes not palpable GI: Abdomen is soft.  No ascites.  Liver spleen not palpable.  No tenderness.  Bowel sounds are within normal limit Lower extremity: No edema Neurological system: Higher functions, cranial nerves intact No evidence of peripheral neuropathy. Skin: No rash.  No ecchymosis..  Target lesion:   LAB RESULTS:  Appointment on 07/10/2014  Component Date Value Ref Range Status  . Sodium 07/10/2014 130* 135 - 145 mmol/L Final  . Potassium 07/10/2014 3.4* 3.5 - 5.1 mmol/L Final  . Chloride 07/10/2014 98* 101 - 111 mmol/L Final  . CO2 07/10/2014 24  22 - 32 mmol/L Final  . Glucose, Bld 07/10/2014 115* 65 - 99 mg/dL Final  . BUN 07/10/2014 22* 6 - 20 mg/dL Final  . Creatinine, Ser 07/10/2014 1.09* 0.44 - 1.00 mg/dL Final  . Calcium 07/10/2014 8.2* 8.9 - 10.3 mg/dL Final  . Total Protein 07/10/2014 6.6  6.5 - 8.1 g/dL Final  . Albumin 07/10/2014 4.0  3.5 - 5.0 g/dL Final  . AST 07/10/2014 26  15 - 41 U/L Final  . ALT 07/10/2014 18  14 - 54 U/L Final  . Alkaline Phosphatase 07/10/2014 47  38 - 126 U/L Final  . Total Bilirubin 07/10/2014 0.9  0.3 - 1.2 mg/dL Final  . GFR calc non Af Amer 07/10/2014 45* >60 mL/min Final  . GFR calc Af Amer 07/10/2014 52* >60 mL/min Final   Comment: (NOTE) The eGFR has been calculated using the CKD EPI equation. This calculation has not  been validated in all clinical situations. eGFR's persistently <60 mL/min signify possible Chronic Kidney Disease.   . Anion gap 07/10/2014 8  5 - 15 Final  . WBC 07/10/2014 5.5  3.6 - 11.0 K/uL Final  . RBC 07/10/2014 4.47  3.80 - 5.20 MIL/uL Final  . Hemoglobin 07/10/2014 12.1  12.0 - 16.0 g/dL Final  . HCT 07/10/2014 38.3  35.0 - 47.0 % Final  . MCV 07/10/2014 85.7  80.0 - 100.0 fL Final  . MCH 07/10/2014 27.1  26.0 - 34.0 pg Final  . MCHC 07/10/2014 31.6* 32.0 - 36.0 g/dL Final  . RDW 07/10/2014 16.3* 11.5 - 14.5 % Final  . Platelets 07/10/2014 100* 150 - 440 K/uL Final  . Neutrophils Relative % 07/10/2014 14   Final  . Neutro Abs 07/10/2014 0.8* 1.4 -  6.5 K/uL Final  . Lymphocytes Relative 07/10/2014 73   Final  . Lymphs Abs 07/10/2014 4.0* 1.0 - 3.6 K/uL Final  . Monocytes Relative 07/10/2014 11   Final  . Monocytes Absolute 07/10/2014 0.6  0.2 - 0.9 K/uL Final  . Eosinophils Relative 07/10/2014 1   Final  . Eosinophils Absolute 07/10/2014 0.0  0 - 0.7 K/uL Final  . Basophils Relative 07/10/2014 1   Final  . Basophils Absolute 07/10/2014 0.0  0 - 0.1 K/uL Final  . LDH 07/10/2014 195* 98 - 192 U/L Final        ASSESSMENT: 81 year old lady with chronic lymphocytic leukemia now on Ibrutanib.   Patient has agreed to side effect with hematological toxicity  In view of patient's old age and neutrophil count being 800 will hold off improved gurney but present time until neutrophil count improves.  And then reduce the dose to 280 mg . Recheck CBC in 2 weeks.  If neutrophil counts improves then resume ibrutanib  Hemoglobin is improved This and was advised to call me if spikes any fever more than 100 or any chills  MEDICAL DECISION MAKING:  All the lab data has been reviewed.  Side effect profile is been reviewed.  Will continue Ibrutanib.  If needed anemia workup will be ordered.  Patient is responding to Ibrutanib by clinical criteria  Patient expressed understanding and was  in agreement with this plan. She also understands that She can call clinic at any time with any questions, concerns, or complaints.    No matching staging information was found for the patient.  Forest Gleason, MD   07/11/2014 8:41 AM

## 2014-07-12 ENCOUNTER — Telehealth: Payer: Self-pay | Admitting: *Deleted

## 2014-07-12 NOTE — Telephone Encounter (Signed)
Anderson Malta with [employer] called to verify why patient's Kate Sable is on hold?  Informed her that patient had abnormal labs and MD wants to hold Imbruvica for 3 weeks and recheck her labs. Will reevaluate at that time.

## 2014-07-31 ENCOUNTER — Encounter: Payer: Self-pay | Admitting: Oncology

## 2014-07-31 ENCOUNTER — Inpatient Hospital Stay: Payer: Medicare Other

## 2014-07-31 ENCOUNTER — Inpatient Hospital Stay: Payer: Medicare Other | Attending: Oncology | Admitting: Oncology

## 2014-07-31 VITALS — BP 146/79 | HR 80 | Temp 97.7°F | Wt 148.4 lb

## 2014-07-31 DIAGNOSIS — Z5111 Encounter for antineoplastic chemotherapy: Secondary | ICD-10-CM | POA: Diagnosis not present

## 2014-07-31 DIAGNOSIS — E785 Hyperlipidemia, unspecified: Secondary | ICD-10-CM | POA: Insufficient documentation

## 2014-07-31 DIAGNOSIS — R160 Hepatomegaly, not elsewhere classified: Secondary | ICD-10-CM | POA: Insufficient documentation

## 2014-07-31 DIAGNOSIS — D709 Neutropenia, unspecified: Secondary | ICD-10-CM | POA: Diagnosis not present

## 2014-07-31 DIAGNOSIS — E780 Pure hypercholesterolemia: Secondary | ICD-10-CM

## 2014-07-31 DIAGNOSIS — Z79899 Other long term (current) drug therapy: Secondary | ICD-10-CM

## 2014-07-31 DIAGNOSIS — C9110 Chronic lymphocytic leukemia of B-cell type not having achieved remission: Secondary | ICD-10-CM

## 2014-07-31 DIAGNOSIS — Z7982 Long term (current) use of aspirin: Secondary | ICD-10-CM | POA: Diagnosis not present

## 2014-07-31 DIAGNOSIS — E039 Hypothyroidism, unspecified: Secondary | ICD-10-CM | POA: Insufficient documentation

## 2014-07-31 DIAGNOSIS — D649 Anemia, unspecified: Secondary | ICD-10-CM

## 2014-07-31 DIAGNOSIS — I10 Essential (primary) hypertension: Secondary | ICD-10-CM | POA: Insufficient documentation

## 2014-07-31 LAB — CBC WITH DIFFERENTIAL/PLATELET
Basophils Absolute: 0 10*3/uL (ref 0–0.1)
Basophils Relative: 1 %
Eosinophils Absolute: 0.2 10*3/uL (ref 0–0.7)
Eosinophils Relative: 4 %
HEMATOCRIT: 39.8 % (ref 35.0–47.0)
Hemoglobin: 12.8 g/dL (ref 12.0–16.0)
Lymphocytes Relative: 76 %
Lymphs Abs: 2.9 10*3/uL (ref 1.0–3.6)
MCH: 26.7 pg (ref 26.0–34.0)
MCHC: 32.3 g/dL (ref 32.0–36.0)
MCV: 82.8 fL (ref 80.0–100.0)
MONO ABS: 0.5 10*3/uL (ref 0.2–0.9)
MONOS PCT: 13 %
Neutro Abs: 0.2 10*3/uL — ABNORMAL LOW (ref 1.4–6.5)
Neutrophils Relative %: 6 %
PLATELETS: 133 10*3/uL — AB (ref 150–440)
RBC: 4.81 MIL/uL (ref 3.80–5.20)
RDW: 15.7 % — AB (ref 11.5–14.5)
WBC: 3.9 10*3/uL (ref 3.6–11.0)

## 2014-07-31 MED ORDER — IMBRUVICA 140 MG PO TABS: 280.0000 mg | ORAL_TABLET | Freq: Every day | ORAL | Status: DC

## 2014-07-31 NOTE — Progress Notes (Signed)
Patient does not have living will.  Declined info.  Never smoked.

## 2014-07-31 NOTE — Progress Notes (Signed)
[place] @ [facility] [phone]) [phone]  Fax:(336) [place]: 1928/09/13  MR#: [id number]  XNT#:[id number]  Patient Care Team: Albina Billet, MD as PCP - General (Internal Medicine)  CHIEF COMPLAINT:  Chief Complaint  Patient presents with  . Follow-up    Oncology History   1. Ultrasound revealed abdominal lymphadenopathy(March, 2013) 2. Biopsy as well as peripheral blood be suggestive of chronic lymphocytic leukemia (April, 2013) 3. Started on bendamustin and Rituxan from May of 2013 4. Has finished her 6 cycles of chemotherapy with Rituxan and bendamustine (October 26, 2011) 5.recurrent and progressive disease with progressive anemia.  Patient was started on IBRUTANIB (March, 2016)  taking 140 mg 3 pills a day     Chronic lymphocytic leukemia   06/06/2014 Initial Diagnosis Chronic lymphocytic leukemia    No flowsheet data found.  INTERVAL HISTORY: 81 year old lady with a history of chronic lymphocytic leukemia recently was started on Ibrutanib.  Patient is tolerating treatment very well be slightly reduced dose.  White count is decreased to 17,000 with decrease in lymphocyte overall patient is feeling better.  According to her there are more good days and bad days.  No diarrhea.  No rash.  No nausea.  No vomiting. July 10, 2014 Patient is here for further follow-up and treatment consideration regarding chronic lymphocytic leukemia with recurrent disease.  Patient has been started on a bruit unable and taking 420 mg by mouth daily. Tolerating treatment very well.  However patient's neutrophil count has dropped to 800.  No chills.  No fever.  No soreness in the mouth.  No diarrhea. July 12 th , 2016 Patient is here for ongoing evaluation and treatment consideration.  Patient and neutropenia.  Ibrutanib was on hold.  Neutropenia is now slowly improving.  No chills.  No fever.  Patient is here for ongoing evaluation gradually gaining strength.  Walking with the  help of walker.  REVIEW OF SYSTEMS:   GENERAL:  Feels good.  Active.  No fevers, sweats or weight loss. PERFORMANCE STATUS (ECOG): 01 HEENT:  No visual changes, runny nose, sore throat, mouth sores or tenderness. Lungs: No shortness of breath or cough.  No hemoptysis. Cardiac:  No chest pain, palpitations, orthopnea, or PND. GI:  No nausea, vomiting, diarrhea, constipation, melena or hematochezia. GU:  No urgency, frequency, dysuria, or hematuria. Musculoskeletal:  No back pain.  No joint pain.  No muscle tenderness. Extremities:  No pain or swelling. Skin:  No rashes or skin changes. Neuro:  No headache, numbness or weakness, balance or coordination issues. Endocrine:  No diabetes, thyroid issues, hot flashes or night sweats. Psych:  No mood changes, depression or anxiety. Pain:  No focal pain. Review of systems:  All other systems reviewed and found to be negative.  PAST MEDICAL HISTORY: Past Medical History  Diagnosis Date  . Chronic lymphocytic leukemia 06/06/2014   Significant History/PMH:   Spleenomegaly:    Hepatomegaly:    Palpitations:    Hypertension:    Anemia:    Hypothyroidism:    Hard of Hearing:    htn:    high cholesterol:   Smoking History: Smoking History Never Smoked.(1)  PFSH: Comments: No family history of colorectal cancer, breast cancer, or ovarian cancer.  Comments: Does not smoke.  Does not drink.  Lives by herself  Additional Past Medical and Surgical History: Hypothyroidism.    Hyperlipidemia       ADVANCED DIRECTIVES: Patient does have advance care directive  HEALTH  MAINTENANCE: History  Substance Use Topics  . Smoking status: Never Smoker   . Smokeless tobacco: Not on file  . Alcohol Use: Not on file     No Known Allergies  Current Outpatient Prescriptions  Medication Sig Dispense Refill  . aspirin EC 81 MG tablet Take 81 mg by mouth daily.    Marland Kitchen atorvastatin (LIPITOR) 20 MG tablet     . diltiazem (CARDIZEM CD) 240 MG  24 hr capsule     . IMBRUVICA 140 MG capsul Take 2 capsules (280 mg total) by mouth daily. 60 capsule 3  . levothyroxine (SYNTHROID, LEVOTHROID) 75 MCG tablet     . triamterene-hydrochlorothiazide (DYAZIDE) 37.5-25 MG per capsule      No current facility-administered medications for this visit.    OBJECTIVE:  Filed Vitals:   07/31/14 1518  BP: 146/79  Pulse: 80  Temp: 97.7 F (36.5 C)     There is no height on file to calculate BMI.    ECOG FS:1 - Symptomatic but completely ambulatory  PHYSICAL EXAM:  General status: Performance status is good.  Patient has not lost significant weight HEENT: No evidence of stomatitis.   Sclera and conjunctivae :: No jaundice.   pale looking . Lungs: Air  entry equal on both sides.  No rhonchi.  No rales.  Cardiac: Heart sounds are normal.  No pericardial rub.  No murmur. Lymphatic system: Cervical, axillary, inguinal, lymph nodes not palpable GI: Abdomen is soft.  No ascites.  Liver spleen not palpable.  No tenderness.  Bowel sounds are within normal limit Lower extremity: No edema Neurological system: Higher functions, cranial nerves intact No evidence of peripheral neuropathy. Skin: No rash.  No ecchymosis.Marland Kitchen    LAB RESULTS:  Appointment on 07/31/2014  Component Date Value Ref Range Status  . WBC 07/31/2014 3.9  3.6 - 11.0 K/uL Final  . RBC 07/31/2014 4.81  3.80 - 5.20 MIL/uL Final  . Hemoglobin 07/31/2014 12.8  12.0 - 16.0 g/dL Final  . HCT 07/31/2014 39.8  35.0 - 47.0 % Final  . MCV 07/31/2014 82.8  80.0 - 100.0 fL Final  . MCH 07/31/2014 26.7  26.0 - 34.0 pg Final  . MCHC 07/31/2014 32.3  32.0 - 36.0 g/dL Final  . RDW 07/31/2014 15.7* 11.5 - 14.5 % Final  . Platelets 07/31/2014 133* 150 - 440 K/uL Final  . Neutrophils Relative % 07/31/2014 6   Final  . Neutro Abs 07/31/2014 0.2* 1.4 - 6.5 K/uL Final  . Lymphocytes Relative 07/31/2014 76   Final  . Lymphs Abs 07/31/2014 2.9  1.0 - 3.6 K/uL Final  . Monocytes Relative 07/31/2014  13   Final  . Monocytes Absolute 07/31/2014 0.5  0.2 - 0.9 K/uL Final  . Eosinophils Relative 07/31/2014 4   Final  . Eosinophils Absolute 07/31/2014 0.2  0 - 0.7 K/uL Final  . Basophils Relative 07/31/2014 1   Final  . Basophils Absolute 07/31/2014 0.0  0 - 0.1 K/uL Final        ASSESSMENT: 81 year old lady with chronic lymphocytic leukemia now on Ibrutanib.   Patient continues to have neutrophil count is still low.  But no chills and fever. In view of that we will start patient back on 2 tablets a day of  ibrutanib after neutrophil count improves  MEDICAL DECISION MAKING:  Chronic lymphocytic leukemia. We will continue to hold Lipitor currently.  White count improves Absolute neutrophil count is 200  Patient expressed understanding and was in agreement with this  plan. She also understands that She can call clinic at any time with any questions, concerns, or complaints.    No matching staging information was found for the patient.  Forest Gleason, MD   07/31/2014 8:03 PM

## 2014-08-01 ENCOUNTER — Telehealth: Payer: Self-pay | Admitting: *Deleted

## 2014-08-01 NOTE — Telephone Encounter (Signed)
-----   Message from Forest Gleason, MD sent at 07/31/2014  8:07 PM EDT ----- Regarding: ibrutanib Patient's absolute white count is still  200. So we may help to hold ibrutanib for next few weeks and repeat CBC.

## 2014-08-01 NOTE — Telephone Encounter (Signed)
Called patient to inform her not to take her ibrutanib until she sees him for her next appt in August.  Patient verbalized understanding.

## 2014-08-28 ENCOUNTER — Inpatient Hospital Stay: Payer: Medicare Other | Attending: Oncology | Admitting: Oncology

## 2014-08-28 ENCOUNTER — Inpatient Hospital Stay: Payer: Medicare Other

## 2014-08-28 VITALS — BP 125/91 | HR 81 | Temp 97.4°F | Wt 146.0 lb

## 2014-08-28 DIAGNOSIS — C9110 Chronic lymphocytic leukemia of B-cell type not having achieved remission: Secondary | ICD-10-CM

## 2014-08-28 DIAGNOSIS — D759 Disease of blood and blood-forming organs, unspecified: Secondary | ICD-10-CM | POA: Diagnosis not present

## 2014-08-28 DIAGNOSIS — Z79899 Other long term (current) drug therapy: Secondary | ICD-10-CM

## 2014-08-28 DIAGNOSIS — I10 Essential (primary) hypertension: Secondary | ICD-10-CM

## 2014-08-28 DIAGNOSIS — R002 Palpitations: Secondary | ICD-10-CM | POA: Diagnosis not present

## 2014-08-28 DIAGNOSIS — H9190 Unspecified hearing loss, unspecified ear: Secondary | ICD-10-CM | POA: Diagnosis not present

## 2014-08-28 DIAGNOSIS — D649 Anemia, unspecified: Secondary | ICD-10-CM

## 2014-08-28 DIAGNOSIS — E780 Pure hypercholesterolemia: Secondary | ICD-10-CM | POA: Diagnosis not present

## 2014-08-28 DIAGNOSIS — R160 Hepatomegaly, not elsewhere classified: Secondary | ICD-10-CM

## 2014-08-28 DIAGNOSIS — Z9221 Personal history of antineoplastic chemotherapy: Secondary | ICD-10-CM | POA: Diagnosis not present

## 2014-08-28 DIAGNOSIS — D739 Disease of spleen, unspecified: Secondary | ICD-10-CM | POA: Diagnosis not present

## 2014-08-28 DIAGNOSIS — Z7982 Long term (current) use of aspirin: Secondary | ICD-10-CM

## 2014-08-28 DIAGNOSIS — E039 Hypothyroidism, unspecified: Secondary | ICD-10-CM

## 2014-08-28 LAB — CBC WITH DIFFERENTIAL/PLATELET
BASOS PCT: 1 %
Basophils Absolute: 0.1 10*3/uL (ref 0–0.1)
Eosinophils Absolute: 0.1 10*3/uL (ref 0–0.7)
Eosinophils Relative: 2 %
HCT: 40.4 % (ref 35.0–47.0)
Hemoglobin: 13.4 g/dL (ref 12.0–16.0)
Lymphocytes Relative: 77 %
Lymphs Abs: 3.7 10*3/uL — ABNORMAL HIGH (ref 1.0–3.6)
MCH: 27.1 pg (ref 26.0–34.0)
MCHC: 33.3 g/dL (ref 32.0–36.0)
MCV: 81.2 fL (ref 80.0–100.0)
Monocytes Absolute: 0.5 10*3/uL (ref 0.2–0.9)
Monocytes Relative: 11 %
Neutro Abs: 0.4 10*3/uL — ABNORMAL LOW (ref 1.4–6.5)
Neutrophils Relative %: 9 %
PLATELETS: 133 10*3/uL — AB (ref 150–440)
RBC: 4.97 MIL/uL (ref 3.80–5.20)
RDW: 16.1 % — ABNORMAL HIGH (ref 11.5–14.5)
WBC: 4.8 10*3/uL (ref 3.6–11.0)

## 2014-08-28 LAB — COMPREHENSIVE METABOLIC PANEL
ALBUMIN: 4.4 g/dL (ref 3.5–5.0)
ALT: 14 U/L (ref 14–54)
AST: 19 U/L (ref 15–41)
Alkaline Phosphatase: 54 U/L (ref 38–126)
Anion gap: 6 (ref 5–15)
BILIRUBIN TOTAL: 0.9 mg/dL (ref 0.3–1.2)
BUN: 23 mg/dL — ABNORMAL HIGH (ref 6–20)
CALCIUM: 9.2 mg/dL (ref 8.9–10.3)
CHLORIDE: 102 mmol/L (ref 101–111)
CO2: 25 mmol/L (ref 22–32)
CREATININE: 0.97 mg/dL (ref 0.44–1.00)
GFR calc Af Amer: >60 mL/min (ref >60)
GFR calc non Af Amer: 52 mL/min — ABNORMAL LOW (ref >60)
GLUCOSE: 115 mg/dL — AB (ref 65–99)
POTASSIUM: 3.9 mmol/L (ref 3.5–5.1)
Sodium: 133 mmol/L — ABNORMAL LOW (ref 135–145)
Total Protein: 7.3 g/dL (ref 6.5–8.1)

## 2014-08-28 MED ORDER — LORATADINE 10 MG PO TABS: 10.0000 mg | ORAL_TABLET | Freq: Every day | ORAL | Status: DC

## 2014-08-28 NOTE — Progress Notes (Signed)
MD aware of ANC 0.4.

## 2014-08-28 NOTE — Progress Notes (Signed)
Patient does not have living will.  Never smoked. 

## 2014-08-29 ENCOUNTER — Encounter: Payer: Self-pay | Admitting: Oncology

## 2014-08-29 NOTE — Progress Notes (Signed)
[place] @ [facility] Telephone:(336) [phone]  Fax:(336) [place]: 20-Sep-1928  MR#: [id number]  MWU#:[id number]  Patient Care Team: Albina Billet, MD as PCP - General (Internal Medicine)  CHIEF COMPLAINT:  Chief Complaint  Patient presents with  . Follow-up    Oncology History   1. Ultrasound revealed abdominal lymphadenopathy(March, 2013) 2. Biopsy as well as peripheral blood be suggestive of chronic lymphocytic leukemia (April, 2013) 3. Started on bendamustin and Rituxan from May of 2013 4. Has finished her 6 cycles of chemotherapy with Rituxan and bendamustine (October 26, 2011) 5.recurrent and progressive disease with progressive anemia.  Patient was started on IBRUTANIB (March, 2016)  taking 140 mg 3 pills a day     Chronic lymphocytic leukemia   06/06/2014 Initial Diagnosis Chronic lymphocytic leukemia    No flowsheet data found.  INTERVAL HISTORY: 81 year old lady with a history of chronic lymphocytic leukemia recently was started on Ibrutanib.  Patient is tolerating treatment very well be slightly reduced dose.  White count is decreased to 17,000 with decrease in lymphocyte overall patient is feeling better.  According to her there are more good days and bad days.  No diarrhea.  No rash.  No nausea.  No vomiting. July 10, 2014 Patient is here for further follow-up and treatment consideration regarding chronic lymphocytic leukemia with recurrent disease.  Patient has been started on a bruit unable and taking 420 mg by mouth daily. Tolerating treatment very well.  However patient's neutrophil count has dropped to 800.  No chills.  No fever.  No soreness in the mouth.  No diarrhea. July 12 th , 2016 Patient is here for ongoing evaluation and treatment consideration.  Patient and neutropenia.  Ibrutanib was on hold.  Neutropenia is now slowly improving.  No chills.  No fever.  Patient is here for ongoing evaluation gradually gaining strength.  Walking with the  help of walker. August 28, 2014 Patient is here for ongoing evaluation and follow-up regarding chronic lymphocytic leukemia patient has responded very well to [place].  However still myelo suppressed no fever.  Complaining of congestion and runny nose.  Appetite has been stable.  REVIEW OF SYSTEMS:   GENERAL:  Feels good.  Active.  No fevers, sweats or weight loss. PERFORMANCE STATUS (ECOG): 01 HEENT:  No visual changes, runny nose, sore throat, mouth sores or tenderness. Lungs: No shortness of breath or cough.  No hemoptysis. Cardiac:  No chest pain, palpitations, orthopnea, or PND. GI:  No nausea, vomiting, diarrhea, constipation, melena or hematochezia. GU:  No urgency, frequency, dysuria, or hematuria. Musculoskeletal:  No back pain.  No joint pain.  No muscle tenderness. Extremities:  No pain or swelling. Skin:  No rashes or skin changes. Neuro:  No headache, numbness or weakness, balance or coordination issues. Endocrine:  No diabetes, thyroid issues, hot flashes or night sweats. Psych:  No mood changes, depression or anxiety. Pain:  No focal pain. Review of systems:  All other systems reviewed and found to be negative.  PAST MEDICAL HISTORY: Past Medical History  Diagnosis Date  . Chronic lymphocytic leukemia 06/06/2014   Significant History/PMH:   Spleenomegaly:    Hepatomegaly:    Palpitations:    Hypertension:    Anemia:    Hypothyroidism:    Hard of Hearing:    htn:    high cholesterol:   Smoking History: Smoking History Never Smoked.(1)  PFSH: Comments: No family history of colorectal cancer, breast cancer, or ovarian cancer.  Comments: Does not smoke.  Does not drink.  Lives by herself  Additional Past Medical and Surgical History: Hypothyroidism.    Hyperlipidemia       ADVANCED DIRECTIVES: Patient does have advance care directive  HEALTH MAINTENANCE: Social History  Substance Use Topics  . Smoking status: Never Smoker   . Smokeless  tobacco: None  . Alcohol Use: None     No Known Allergies  Current Outpatient Prescriptions  Medication Sig Dispense Refill  . aspirin EC 81 MG tablet Take 81 mg by mouth daily.    Marland Kitchen atorvastatin (LIPITOR) 20 MG tablet     . diltiazem (CARDIZEM CD) 240 MG 24 hr capsule     . IMBRUVICA 140 MG capsul Take 2 capsules (280 mg total) by mouth daily. 60 capsule 3  . levothyroxine (SYNTHROID, LEVOTHROID) 75 MCG tablet     . triamterene-hydrochlorothiazide (DYAZIDE) 37.5-25 MG per capsule     . loratadine (CLARITIN) 10 MG tablet Take 1 tablet (10 mg total) by mouth daily. 15 tablet 0   No current facility-administered medications for this visit.    OBJECTIVE:  Filed Vitals:   08/28/14 1105  BP: 125/91  Pulse: 81  Temp: 97.4 F (36.3 C)     There is no height on file to calculate BMI.    ECOG FS:1 - Symptomatic but completely ambulatory  PHYSICAL EXAM:  General status: Performance status is good.  Patient has not lost significant weight No chills or fever no significant change in patient's performance status HEENT: No evidence of stomatitis.  [place] of runny nose. Sclera and conjunctivae :: No jaundice.   pale looking . Lungs: Air  entry equal on both sides.  No rhonchi.  No rales.  Cardiac: Heart sounds are normal.  No pericardial rub.  No murmur. Lymphatic system: Cervical, axillary, inguinal, lymph nodes not palpable GI: Abdomen is soft.  No ascites.  Liver spleen not palpable.  No tenderness.  Bowel sounds are within normal limit Lower extremity: No edema Neurological system: Higher functions, cranial nerves intact No evidence of peripheral neuropathy. Skin: No rash.  No ecchymosis.Marland Kitchen    LAB RESULTS:  Appointment on 08/28/2014  Component Date Value Ref Range Status  . WBC 08/28/2014 4.8  3.6 - 11.0 K/uL Final  . RBC 08/28/2014 4.97  3.80 - 5.20 MIL/uL Final  . Hemoglobin 08/28/2014 13.4  12.0 - 16.0 g/dL Final  . HCT 08/28/2014 40.4  35.0 - 47.0 % Final  . MCV  08/28/2014 81.2  80.0 - 100.0 fL Final  . MCH 08/28/2014 27.1  26.0 - 34.0 pg Final  . MCHC 08/28/2014 33.3  32.0 - 36.0 g/dL Final  . RDW 08/28/2014 16.1* 11.5 - 14.5 % Final  . Platelets 08/28/2014 133* 150 - 440 K/uL Final  . Neutrophils Relative % 08/28/2014 9   Final  . Neutro Abs 08/28/2014 0.4* 1.4 - 6.5 K/uL Final   Comment: RESULT REPEATED AND VERIFIED CRITICAL RESULT CALLED TO, READ BACK BY AND VERIFIED WITH: HAYLEY RHODE AT 1046 08/28/2014 BY KMR   . Lymphocytes Relative 08/28/2014 77   Final  . Lymphs Abs 08/28/2014 3.7* 1.0 - 3.6 K/uL Final  . Monocytes Relative 08/28/2014 11   Final  . Monocytes Absolute 08/28/2014 0.5  0.2 - 0.9 K/uL Final  . Eosinophils Relative 08/28/2014 2   Final  . Eosinophils Absolute 08/28/2014 0.1  0 - 0.7 K/uL Final  . Basophils Relative 08/28/2014 1   Final  . Basophils Absolute 08/28/2014 0.1  0 - 0.1 K/uL Final  . Sodium 08/28/2014 133* 135 - 145 mmol/L Final  . Potassium 08/28/2014 3.9  3.5 - 5.1 mmol/L Final  . Chloride 08/28/2014 102  101 - 111 mmol/L Final  . CO2 08/28/2014 25  22 - 32 mmol/L Final  . Glucose, Bld 08/28/2014 115* 65 - 99 mg/dL Final  . BUN 08/28/2014 23* 6 - 20 mg/dL Final  . Creatinine, Ser 08/28/2014 0.97  0.44 - 1.00 mg/dL Final  . Calcium 08/28/2014 9.2  8.9 - 10.3 mg/dL Final  . Total Protein 08/28/2014 7.3  6.5 - 8.1 g/dL Final  . Albumin 08/28/2014 4.4  3.5 - 5.0 g/dL Final  . AST 08/28/2014 19  15 - 41 U/L Final  . ALT 08/28/2014 14  14 - 54 U/L Final  . Alkaline Phosphatase 08/28/2014 54  38 - 126 U/L Final  . Total Bilirubin 08/28/2014 0.9  0.3 - 1.2 mg/dL Final  . GFR calc non Af Amer 08/28/2014 52* >60 mL/min Final  . GFR calc Af Amer 08/28/2014 >60  >60 mL/min Final   Comment: (NOTE) The eGFR has been calculated using the CKD EPI equation. This calculation has not been validated in all clinical situations. eGFR's persistently <60 mL/min signify possible Chronic Kidney Disease.   . Anion gap 08/28/2014  6  5 - 15 Final        ASSESSMENT: 81 year old lady with chronic lymphocytic leukemia now on Ibrutanib.   Patient continues to have neutrophil count is still low.  But no chills and fever. Patient continues to be myelo suppressed Hold Ibrutanib Reevaluate patient in 4 weeks Nasal congestion Claritin was advised patient spikes fever   She is supposed to call us to start antibiotic or consideration of hospitalization. As patient is myelo suppressed  MEDICAL DECISION MAKING:  Chronic lymphocytic leukemia. Patient  is myelosuppressed secondary to chemotherapy. Was advised to call me if spikes fever more than 100.  Or any chills or fever.  Hold chemotherapy  Patient expressed understanding and was in agreement with this plan. She also understands that She can call clinic at any time with any questions, concerns, or complaints.    No matching staging information was found for the patient.  Forest Gleason, MD   08/29/2014 9:26 AM

## 2014-09-27 ENCOUNTER — Inpatient Hospital Stay: Payer: Medicare Other | Attending: Oncology

## 2014-09-27 ENCOUNTER — Inpatient Hospital Stay (HOSPITAL_BASED_OUTPATIENT_CLINIC_OR_DEPARTMENT_OTHER): Payer: Medicare Other | Admitting: Oncology

## 2014-09-27 VITALS — BP 134/71 | HR 80 | Temp 97.1°F | Wt 143.1 lb

## 2014-09-27 DIAGNOSIS — I10 Essential (primary) hypertension: Secondary | ICD-10-CM | POA: Insufficient documentation

## 2014-09-27 DIAGNOSIS — E039 Hypothyroidism, unspecified: Secondary | ICD-10-CM | POA: Insufficient documentation

## 2014-09-27 DIAGNOSIS — E780 Pure hypercholesterolemia: Secondary | ICD-10-CM

## 2014-09-27 DIAGNOSIS — R160 Hepatomegaly, not elsewhere classified: Secondary | ICD-10-CM | POA: Diagnosis not present

## 2014-09-27 DIAGNOSIS — Z79899 Other long term (current) drug therapy: Secondary | ICD-10-CM

## 2014-09-27 DIAGNOSIS — Z7982 Long term (current) use of aspirin: Secondary | ICD-10-CM | POA: Insufficient documentation

## 2014-09-27 DIAGNOSIS — C9110 Chronic lymphocytic leukemia of B-cell type not having achieved remission: Secondary | ICD-10-CM | POA: Diagnosis not present

## 2014-09-27 DIAGNOSIS — D649 Anemia, unspecified: Secondary | ICD-10-CM | POA: Diagnosis not present

## 2014-09-27 LAB — CBC WITH DIFFERENTIAL/PLATELET
Basophils Absolute: 0 10*3/uL (ref 0–0.1)
Basophils Relative: 1 %
Eosinophils Absolute: 0.1 10*3/uL (ref 0–0.7)
Eosinophils Relative: 2 %
HEMATOCRIT: 41.8 % (ref 35.0–47.0)
HEMOGLOBIN: 13.9 g/dL (ref 12.0–16.0)
LYMPHS ABS: 3.6 10*3/uL (ref 1.0–3.6)
Lymphocytes Relative: 62 %
MCH: 27.4 pg (ref 26.0–34.0)
MCHC: 33.2 g/dL (ref 32.0–36.0)
MCV: 82.6 fL (ref 80.0–100.0)
Monocytes Absolute: 0.4 10*3/uL (ref 0.2–0.9)
Monocytes Relative: 8 %
NEUTROS PCT: 27 %
Neutro Abs: 1.5 10*3/uL (ref 1.4–6.5)
Platelets: 131 10*3/uL — ABNORMAL LOW (ref 150–440)
RBC: 5.06 MIL/uL (ref 3.80–5.20)
RDW: 16.4 % — ABNORMAL HIGH (ref 11.5–14.5)
WBC: 5.7 10*3/uL (ref 3.6–11.0)

## 2014-09-27 LAB — COMPREHENSIVE METABOLIC PANEL
ALK PHOS: 52 U/L (ref 38–126)
ALT: 15 U/L (ref 14–54)
AST: 20 U/L (ref 15–41)
Albumin: 4.4 g/dL (ref 3.5–5.0)
Anion gap: 4 — ABNORMAL LOW (ref 5–15)
BILIRUBIN TOTAL: 0.9 mg/dL (ref 0.3–1.2)
BUN: 19 mg/dL (ref 6–20)
CALCIUM: 9.1 mg/dL (ref 8.9–10.3)
CO2: 27 mmol/L (ref 22–32)
CREATININE: 1.06 mg/dL — AB (ref 0.44–1.00)
Chloride: 101 mmol/L (ref 101–111)
GFR, EST AFRICAN AMERICAN: 54 mL/min — AB (ref >60)
GFR, EST NON AFRICAN AMERICAN: 47 mL/min — AB (ref >60)
Glucose, Bld: 115 mg/dL — ABNORMAL HIGH (ref 65–99)
Potassium: 3.3 mmol/L — ABNORMAL LOW (ref 3.5–5.1)
Sodium: 132 mmol/L — ABNORMAL LOW (ref 135–145)
Total Protein: 7.2 g/dL (ref 6.5–8.1)

## 2014-09-27 MED ORDER — POTASSIUM CHLORIDE ER 10 MEQ PO TBCR: 10.0000 meq | EXTENDED_RELEASE_TABLET | Freq: Every day | ORAL | Status: DC

## 2014-09-27 NOTE — Progress Notes (Signed)
Patient does not have living will.  Never smoked. 

## 2014-09-28 ENCOUNTER — Encounter: Payer: Self-pay | Admitting: Oncology

## 2014-09-28 NOTE — Progress Notes (Signed)
[facility] [phone]  [phone]     Heather [name] OB: 03/26/1928  MR#: [id number]  CSN#:[id number]  Patient Care Team: Denny C Tate, MD as PCP - General (Internal Medicine)  CHIEF COMPLAINT:  Chief Complaint  Patient presents with  . Follow-up   Oncology History   1. Ultrasound revealed abdominal lymphadenopathy(March, 2013) 2. Biopsy as well as peripheral blood be suggestive of chronic lymphocytic leukemia (April, 2013) 3. Started on bendamustin and Rituxan from May of 2013 4. Has finished her 6 cycles of chemotherapy with Rituxan and bendamustine (October 26, 2011) 5.recurrent and progressive disease with progressive anemia.  Patient was started on IBRUTANIB (March, 2016)  taking 140 mg 3 pills a day   6.  Patient was taken off   IBRUTANIB in August of 2016 because of persistent myelosuppression even with reducing dose   No flowsheet data found.  INTERVAL HISTORY: 81-year-old lady with a history of chronic lymphocytic leukemia recently was started on Ibrutanib.  Patient is tolerating treatment very well be slightly reduced dose.  White count is decreased to 17,000 with decrease in lymphocyte overall patient is feeling better.  According to her there are more good days and bad days.  No diarrhea.  No rash.  No nausea.  No vomiting. July 10, 2014 Patient is here for further follow-up and treatment consideration regarding chronic lymphocytic leukemia with recurrent disease.  Patient has been started on a IBRUTANIB and taking 420 mg by mouth daily. Tolerating treatment very well.  However patient's neutrophil count has dropped to 800.  No chills.  No fever.  No soreness in the mouth.  No diarrhea. July 12 th , 2016 Patient is here for ongoing evaluation and treatment consideration.  Patient and neutropenia.  Ibrutanib was on hold.  Neutropenia is now slowly improving.  No chills.  No fever.  Patient is here for ongoing evaluation gradually gaining  strength.  Walking with the help of walker. August 28, 2014 Patient is here for ongoing evaluation and follow-up regarding chronic lymphocytic leukemia patient has responded very well to Ibrutanib.  However still myelo suppressed no fever.  Complaining of congestion and runny nose.  Appetite has been stable. September 27, 2014 Patient is here for further follow-up regarding chronic myelogenous leukemia.  Feeling better.  Gaining strength.  Patient has been off IBRUTANIB. .  No chills.  No fever.  REVIEW OF SYSTEMS:   GENERAL:  Feels good.  Active.  No fevers, sweats or weight loss. PERFORMANCE STATUS (ECOG): 01 HEENT:  No visual changes, runny nose, sore throat, mouth sores or tenderness. Lungs: No shortness of breath or cough.  No hemoptysis. Cardiac:  No chest pain, palpitations, orthopnea, or PND. GI:  No nausea, vomiting, diarrhea, constipation, melena or hematochezia. GU:  No urgency, frequency, dysuria, or hematuria. Musculoskeletal:  No back pain.  No joint pain.  No muscle tenderness. Extremities:  No pain or swelling. Skin:  No rashes or skin changes. Neuro:  No headache, numbness or weakness, balance or coordination issues. Endocrine:  No diabetes, thyroid issues, hot flashes or night sweats. Psych:  No mood changes, depression or anxiety. Pain:  No focal pain. Review of systems:  All other systems reviewed and found to be negative.  PAST MEDICAL HISTORY: Past Medical History  Diagnosis Date  . Chronic lymphocytic leukemia 06/06/2014   Significant History/PMH:   Spleenomegaly:    Hepatomegaly:    Palpitations:    Hypertension:    Anemia:    Hypothyroidism:      Hard of Hearing:    htn:    high cholesterol:   Smoking History: Smoking History Never Smoked.(1)  PFSH: Comments: No family history of colorectal cancer, breast cancer, or ovarian cancer.  Comments: Does not smoke.  Does not drink.  Lives by herself  Additional Past Medical and Surgical History:  Hypothyroidism.    Hyperlipidemia       ADVANCED DIRECTIVES: Patient does have advance care directive  HEALTH MAINTENANCE: Social History  Substance Use Topics  . Smoking status: Never Smoker   . Smokeless tobacco: None  . Alcohol Use: None     No Known Allergies  Current Outpatient Prescriptions  Medication Sig Dispense Refill  . aspirin EC 81 MG tablet Take 81 mg by mouth daily.    Marland Kitchen atorvastatin (LIPITOR) 20 MG tablet     . diltiazem (CARDIZEM CD) 240 MG 24 hr capsule     . levothyroxine (SYNTHROID, LEVOTHROID) 75 MCG tablet     . loratadine (CLARITIN) 10 MG tablet Take 1 tablet (10 mg total) by mouth daily. 15 tablet 0  . triamterene-hydrochlorothiazide (DYAZIDE) 37.5-25 MG per capsule     . DULoxetine (CYMBALTA) 20 MG capsule Take 20 mg by mouth daily.  2  . potassium chloride (K-DUR) 10 MEQ tablet Take 1 tablet (10 mEq total) by mouth daily. 30 tablet 0   No current facility-administered medications for this visit.    OBJECTIVE:  Filed Vitals:   09/27/14 1031  BP: 134/71  Pulse: 80  Temp: 97.1 F (36.2 C)     There is no height on file to calculate BMI.    ECOG FS:1 - Symptomatic but completely ambulatory  PHYSICAL EXAM:  General status: Performance status is good.  Patient has not lost significant weight No chills or fever no significant change in patient's performance status HEENT: No evidence of stomatitis.  Plains of runny nose. Sclera and conjunctivae :: No jaundice.   pale looking . Lungs: Air  entry equal on both sides.  No rhonchi.  No rales.  Cardiac: Heart sounds are normal.  No pericardial rub.  No murmur. Lymphatic system: Cervical, axillary, inguinal, lymph nodes not palpable GI: Abdomen is soft.  No ascites.  Liver spleen not palpable.  No tenderness.  Bowel sounds are within normal limit Lower extremity: No edema Neurological system: Higher functions, cranial nerves intact No evidence of peripheral neuropathy. Skin: No rash.  No  ecchymosis.Marland Kitchen    LAB RESULTS:  Appointment on 09/27/2014  Component Date Value Ref Range Status  . WBC 09/27/2014 5.7  3.6 - 11.0 K/uL Final  . RBC 09/27/2014 5.06  3.80 - 5.20 MIL/uL Final  . Hemoglobin 09/27/2014 13.9  12.0 - 16.0 g/dL Final  . HCT 09/27/2014 41.8  35.0 - 47.0 % Final  . MCV 09/27/2014 82.6  80.0 - 100.0 fL Final  . MCH 09/27/2014 27.4  26.0 - 34.0 pg Final  . MCHC 09/27/2014 33.2  32.0 - 36.0 g/dL Final  . RDW 09/27/2014 16.4* 11.5 - 14.5 % Final  . Platelets 09/27/2014 131* 150 - 440 K/uL Final  . Neutrophils Relative % 09/27/2014 27   Final  . Neutro Abs 09/27/2014 1.5  1.4 - 6.5 K/uL Final  . Lymphocytes Relative 09/27/2014 62   Final  . Lymphs Abs 09/27/2014 3.6  1.0 - 3.6 K/uL Final  . Monocytes Relative 09/27/2014 8   Final  . Monocytes Absolute 09/27/2014 0.4  0.2 - 0.9 K/uL Final  . Eosinophils Relative 09/27/2014 2  Final  . Eosinophils Absolute 09/27/2014 0.1  0 - 0.7 K/uL Final  . Basophils Relative 09/27/2014 1   Final  . Basophils Absolute 09/27/2014 0.0  0 - 0.1 K/uL Final  . Sodium 09/27/2014 132* 135 - 145 mmol/L Final  . Potassium 09/27/2014 3.3* 3.5 - 5.1 mmol/L Final  . Chloride 09/27/2014 101  101 - 111 mmol/L Final  . CO2 09/27/2014 27  22 - 32 mmol/L Final  . Glucose, Bld 09/27/2014 115* 65 - 99 mg/dL Final  . BUN 09/27/2014 19  6 - 20 mg/dL Final  . Creatinine, Ser 09/27/2014 1.06* 0.44 - 1.00 mg/dL Final  . Calcium 09/27/2014 9.1  8.9 - 10.3 mg/dL Final  . Total Protein 09/27/2014 7.2  6.5 - 8.1 g/dL Final  . Albumin 09/27/2014 4.4  3.5 - 5.0 g/dL Final  . AST 09/27/2014 20  15 - 41 U/L Final  . ALT 09/27/2014 15  14 - 54 U/L Final  . Alkaline Phosphatase 09/27/2014 52  38 - 126 U/L Final  . Total Bilirubin 09/27/2014 0.9  0.3 - 1.2 mg/dL Final  . GFR calc non Af Amer 09/27/2014 47* >60 mL/min Final  . GFR calc Af Amer 09/27/2014 54* >60 mL/min Final   Comment: (NOTE) The eGFR has been calculated using the CKD EPI equation. This  calculation has not been validated in all clinical situations. eGFR's persistently <60 mL/min signify possible Chronic Kidney Disease.   . Anion gap 09/27/2014 4* 5 - 15 Final        ASSESSMENT: 81 year old lady with chronic lymphocytic leukemia now on Ibrutanib.   Hemoglobin has improved.  Patient's white count has stabilized with absolute neutrophil count of 1.5 At present time patient will be taken off IBRUTANIB  We will continue to monitor and observe the patient.  If patient has any recurrence of abnormality related to chronic lymphocytic leukemia now will consider starting patient back on IBRUTANIB in lower doses.  MEDICAL DECISION MAKING:  Chronic lymphocytic leukemia. Appears to be in remission at present time  Patient expressed understanding and was in agreement with this plan. She also understands that She can call clinic at any time with any questions, concerns, or complaints.    No matching staging information was found for the patient.  Forest Gleason, MD   09/28/2014 7:49 AM

## 2014-10-03 ENCOUNTER — Emergency Department
Admission: EM | Admit: 2014-10-03 | Discharge: 2014-10-04 | Disposition: A | Discharge status: Home / Self Care | Payer: Medicare Other | Attending: Emergency Medicine | Admitting: Emergency Medicine

## 2014-10-03 ENCOUNTER — Encounter: Payer: Self-pay | Admitting: *Deleted

## 2014-10-03 DIAGNOSIS — E876 Hypokalemia: Secondary | ICD-10-CM | POA: Insufficient documentation

## 2014-10-03 DIAGNOSIS — Z79899 Other long term (current) drug therapy: Secondary | ICD-10-CM | POA: Insufficient documentation

## 2014-10-03 DIAGNOSIS — R197 Diarrhea, unspecified: Secondary | ICD-10-CM | POA: Diagnosis present

## 2014-10-03 DIAGNOSIS — C9590 Leukemia, unspecified not having achieved remission: Secondary | ICD-10-CM | POA: Diagnosis not present

## 2014-10-03 DIAGNOSIS — I10 Essential (primary) hypertension: Secondary | ICD-10-CM | POA: Diagnosis not present

## 2014-10-03 DIAGNOSIS — Z7982 Long term (current) use of aspirin: Secondary | ICD-10-CM | POA: Diagnosis not present

## 2014-10-03 HISTORY — DX: Essential (primary) hypertension: I10

## 2014-10-03 LAB — COMPREHENSIVE METABOLIC PANEL
ALK PHOS: 50 U/L (ref 38–126)
ALT: 14 U/L (ref 14–54)
ANION GAP: 12 (ref 5–15)
AST: 24 U/L (ref 15–41)
Albumin: 4.7 g/dL (ref 3.5–5.0)
BILIRUBIN TOTAL: 1.5 mg/dL — AB (ref 0.3–1.2)
BUN: 21 mg/dL — AB (ref 6–20)
CO2: 21 mmol/L — AB (ref 22–32)
CREATININE: 1.2 mg/dL — AB (ref 0.44–1.00)
Calcium: 9.7 mg/dL (ref 8.9–10.3)
Chloride: 101 mmol/L (ref 101–111)
GFR calc Af Amer: 47 mL/min — ABNORMAL LOW (ref >60)
GFR calc non Af Amer: 40 mL/min — ABNORMAL LOW (ref >60)
GLUCOSE: 175 mg/dL — AB (ref 65–99)
Potassium: 3 mmol/L — ABNORMAL LOW (ref 3.5–5.1)
SODIUM: 134 mmol/L — AB (ref 135–145)
TOTAL PROTEIN: 7.6 g/dL (ref 6.5–8.1)

## 2014-10-03 LAB — CBC
HCT: 40.2 % (ref 35.0–47.0)
HEMOGLOBIN: 13.6 g/dL (ref 12.0–16.0)
MCH: 28.1 pg (ref 26.0–34.0)
MCHC: 33.9 g/dL (ref 32.0–36.0)
MCV: 82.8 fL (ref 80.0–100.0)
Platelets: 134 10*3/uL — ABNORMAL LOW (ref 150–440)
RBC: 4.86 MIL/uL (ref 3.80–5.20)
RDW: 16.6 % — ABNORMAL HIGH (ref 11.5–14.5)
WBC: 8.2 10*3/uL (ref 3.6–11.0)

## 2014-10-03 LAB — LIPASE, BLOOD: Lipase: 27 U/L (ref 22–51)

## 2014-10-03 MED ORDER — METRONIDAZOLE 500 MG PO TABS
500.0000 mg | ORAL_TABLET | Freq: Once | ORAL | Status: AC
  Administered 2014-10-03: 500 mg via ORAL
  Filled 2014-10-03: qty 1

## 2014-10-03 MED ORDER — POTASSIUM CHLORIDE CRYS ER 20 MEQ PO TBCR
20.0000 meq | EXTENDED_RELEASE_TABLET | Freq: Once | ORAL | Status: AC
  Administered 2014-10-03: 20 meq via ORAL
  Filled 2014-10-03: qty 1

## 2014-10-03 MED ORDER — METRONIDAZOLE 500 MG PO TABS: 500.0000 mg | ORAL_TABLET | Freq: Two times a day (BID) | ORAL | Status: DC

## 2014-10-03 NOTE — ED Provider Notes (Addendum)
[facility] Emergency Department Provider Note  ____________________________________________  Time seen: 2155  I have reviewed the triage vital signs and the nursing notes.   HISTORY  Chief Complaint Diarrhea     HPI Heather [name] is a 81 y.o. female with a history of some form of leukemia that is unclear to me. She reports having diarrhea over the past week. She mentioned this to a nurse at the cancer center who advised her he was okay to take Imodium. She took some Imodium but found little benefit. She continues to have diarrhea, but she has no abdominal pain, no nausea. She had 4 episodes of diarrhea today, including one larger soft bowel movement prior to coming to the emergency department.    Past Medical History  Diagnosis Date  . Chronic lymphocytic leukemia 06/06/2014  . Hypertension     Patient Active Problem List   Diagnosis Date Noted  . Chronic lymphocytic leukemia 06/06/2014    No past surgical history on file.  Current Outpatient Rx  Name  Route  Sig  Dispense  Refill  . aspirin EC 81 MG tablet   Oral   Take 81 mg by mouth daily.         Marland Kitchen atorvastatin (LIPITOR) 20 MG tablet               . diltiazem (CARDIZEM CD) 240 MG 24 hr capsule               . DULoxetine (CYMBALTA) 20 MG capsule   Oral   Take 20 mg by mouth daily.      2   . levothyroxine (SYNTHROID, LEVOTHROID) 75 MCG tablet               . loratadine (CLARITIN) 10 MG tablet   Oral   Take 1 tablet (10 mg total) by mouth daily.   15 tablet   0   . metroNIDAZOLE (FLAGYL) 500 MG tablet   Oral   Take 1 tablet (500 mg total) by mouth 2 (two) times daily.   14 tablet   0   . potassium chloride (K-DUR) 10 MEQ tablet   Oral   Take 1 tablet (10 mEq total) by mouth daily.   30 tablet   0   . triamterene-hydrochlorothiazide (DYAZIDE) 37.5-25 MG per capsule                 Allergies Review of patient's allergies indicates no known  allergies.  No family history on file.  Social History Social History  Substance Use Topics  . Smoking status: Never Smoker   . Smokeless tobacco: Not on file  . Alcohol Use: No    Review of Systems  Constitutional: Negative for fever. ENT: Negative for sore throat. Cardiovascular: Negative for chest pain. Respiratory: Negative for shortness of breath. Gastrointestinal: Negative for abdominal pain, nausea or vomiting. She does have diarrhea. See history of present illness Genitourinary: Negative for dysuria. Musculoskeletal: No myalgias or injuries. Skin: Negative for rash. Neurological: Negative for headaches Hematological: History of leukemia  10-point ROS otherwise negative.  ____________________________________________   PHYSICAL EXAM:  VITAL SIGNS: ED Triage Vitals  Enc Vitals Group     BP 10/03/14 2129 152/46 mmHg     Pulse Rate 10/03/14 2129 91     Resp 10/03/14 2129 16     Temp 10/03/14 2129 98.5 F (36.9 C)     Temp Source 10/03/14 2129 Oral     SpO2 10/03/14 2129 95 %  Weight 10/03/14 2129 143 lb (64.864 kg)     Height 10/03/14 2129 5\' 2"  (1.575 m)     Head Cir --      Peak Flow --      Pain Score 10/03/14 2130 0     Pain Loc --      Pain Edu? --      Excl. in [place]? --     Constitutional:  Alert and oriented. Well appearing and in no distress. ENT   Head: Normocephalic and atraumatic.   Nose: No congestion/rhinnorhea. Cardiovascular: Normal rate, regular rhythm, no murmur noted Respiratory:  Normal respiratory effort, no tachypnea.    Breath sounds are clear and equal bilaterally.  Gastrointestinal: Soft and nontender. No distention.  Musculoskeletal: No deformity noted. Nontender with normal range of motion in all extremities.  No noted edema. Neurologic:  Normal speech and language. No gross focal neurologic deficits are appreciated.  Skin:  Skin is warm, dry. No rash noted. Psychiatric: Mood and affect are normal. Speech and behavior  are normal.  ____________________________________________    LABS (pertinent positives/negatives)  Labs Reviewed  COMPREHENSIVE METABOLIC PANEL - Abnormal; Notable for the following:    Sodium 134 (*)    Potassium 3.0 (*)    CO2 21 (*)    Glucose, Bld 175 (*)    BUN 21 (*)    Creatinine, Ser 1.20 (*)    Total Bilirubin 1.5 (*)    GFR calc non Af Amer 40 (*)    GFR calc Af Amer 47 (*)    All other components within normal limits  CBC - Abnormal; Notable for the following:    RDW 16.6 (*)    Platelets 134 (*)    All other components within normal limits  C DIFFICILE QUICK SCREEN W PCR REFLEX  STOOL CULTURE  LIPASE, BLOOD     ____________________________________________   ____________________________________________   INITIAL IMPRESSION / ASSESSMENT AND PLAN / ED COURSE  Pertinent labs & imaging results that were available during my care of the patient were reviewed by me and considered in my medical decision making (see chart for details).  81 year old female in no acute distress without abdominal pain. She is concerned about her ongoing diarrhea and her potassium level.  Blood tests are pending. I have added on a stool culture and a Clostridium difficile test. Regardless of the outcome, I believe this patient should be placed on Flagyl for the ongoing diarrhea she has had.  ----------------------------------------- 11:27 PM on 10/03/2014 -----------------------------------------  The patient's blood count is normal at 8.2. Her potassium is low at 3.0. I will give her 20 milli-equivalents potassium by mouth.  ----------------------------------------- 12:05 AM on 10/04/2014 -----------------------------------------  Patient has not been able to have a bowel movement here. She is comfortable and appears stable. We will discharge her with a prescription for Flagyl and azithromycin.  ____________________________________________   FINAL CLINICAL IMPRESSION(S) / ED  DIAGNOSES  Final diagnoses:  Diarrhea  Hypokalemia      Ahmed Prima, MD 10/03/14 1448  Ahmed Prima, MD 10/04/14 [phone]

## 2014-10-03 NOTE — ED Notes (Signed)
Pt to ED from home with diarrhea x 1 week. Pt denies blood in stool or abd pain. Hx of leukemia and hypertension. Pt seen PCP last week given imodium, stop taking. AAOx3 at on arrival, vitals wnl.

## 2014-10-03 NOTE — Discharge Instructions (Signed)
Your potassium is low. You had a level of 3.0 today. You're given 20 mEq of potassium in the emergency department. Continue to take your potassium at home. Due to the ongoing diarrhea, we are prescribing Flagyl and azithromycin. Take his prescribed. Follow-up with your regular doctor or Dr. Oliva Bustard. Return to the emergency department if you have fever, abdominal pain, weakness, or other urgent concerns.    Diarrhea Diarrhea is watery poop (stool). It can make you feel weak, tired, thirsty, or give you a dry mouth (signs of dehydration). Watery poop is a sign of another problem, most often an infection. It often lasts 2-3 days. It can last longer if it is a sign of something serious. Take care of yourself as told by your doctor. HOME CARE   Drink 1 cup (8 ounces) of fluid each time you have watery poop.  Do not drink the following fluids:  Those that contain simple sugars (fructose, glucose, galactose, lactose, sucrose, maltose).  Sports drinks.  Fruit juices.  Whole milk products.  Sodas.  Drinks with caffeine (coffee, tea, soda) or alcohol.  Oral rehydration solution may be used if the doctor says it is okay. You may make your own solution. Follow this recipe:   - teaspoon table salt.   teaspoon baking soda.   teaspoon salt substitute containing potassium chloride.  1 tablespoons sugar.  1 liter (34 ounces) of water.  Avoid the following foods:  High fiber foods, such as raw fruits and vegetables.  Nuts, seeds, and whole grain breads and cereals.   Those that are sweetened with sugar alcohols (xylitol, sorbitol, mannitol).  Try eating the following foods:  Starchy foods, such as rice, toast, pasta, low-sugar cereal, oatmeal, baked potatoes, crackers, and bagels.  Bananas.  Applesauce.  Eat probiotic-rich foods, such as yogurt and milk products that are fermented.  Wash your hands well after each time you have watery poop.  Only take medicine as told by your  doctor.  Take a warm bath to help lessen burning or pain from having watery poop. GET HELP RIGHT AWAY IF:   You cannot drink fluids without throwing up (vomiting).  You keep throwing up.  You have blood in your poop, or your poop looks black and tarry.  You do not pee (urinate) in 6-8 hours, or there is only a small amount of very dark pee.  You have belly (abdominal) pain that gets worse or stays in the same spot (localizes).  You are weak, dizzy, confused, or light-headed.  You have a very bad headache.  Your watery poop gets worse or does not get better.  You have a fever or lasting symptoms for more than 2-3 days.  You have a fever and your symptoms suddenly get worse. MAKE SURE YOU:   Understand these instructions.  Will watch your condition.  Will get help right away if you are not doing well or get worse. Document Released: 06/24/2007 Document Revised: 05/22/2013 Document Reviewed: 09/13/2011 [facility] Patient Information 2015 [place], [place]. This information is not intended to replace advice given to you by your health care provider. Make sure you discuss any questions you have with your health care provider.

## 2014-10-03 NOTE — ED Notes (Signed)
Pt. States having diarrhea for the past week.  Pt. States no change in medication or diet.  Pt. States she took liquid imodium a couple days ago but stopped taking it, fearing it would not interact with current medications.  Pt. States normal intake in food and drink today.  Pt. States having diarrhea 3x today.

## 2014-10-04 DIAGNOSIS — R197 Diarrhea, unspecified: Secondary | ICD-10-CM | POA: Diagnosis not present

## 2014-10-04 MED ORDER — AZITHROMYCIN 250 MG PO TABS
250.0000 mg | ORAL_TABLET | Freq: Every day | ORAL | Status: AC
Start: 2014-10-04 — End: 2014-10-07

## 2014-10-04 MED ORDER — AZITHROMYCIN 250 MG PO TABS
500.0000 mg | ORAL_TABLET | Freq: Once | ORAL | Status: AC
  Administered 2014-10-04: 500 mg via ORAL
  Filled 2014-10-04: qty 2

## 2014-10-23 ENCOUNTER — Other Ambulatory Visit: Payer: Self-pay | Admitting: Oncology

## 2014-12-06 ENCOUNTER — Inpatient Hospital Stay: Payer: Medicare Other | Attending: Oncology

## 2014-12-06 ENCOUNTER — Encounter: Payer: Self-pay | Admitting: Oncology

## 2014-12-06 ENCOUNTER — Inpatient Hospital Stay (HOSPITAL_BASED_OUTPATIENT_CLINIC_OR_DEPARTMENT_OTHER): Payer: Medicare Other | Admitting: Oncology

## 2014-12-06 VITALS — BP 136/56 | HR 77 | Temp 97.4°F | Wt 147.0 lb

## 2014-12-06 DIAGNOSIS — E039 Hypothyroidism, unspecified: Secondary | ICD-10-CM | POA: Diagnosis not present

## 2014-12-06 DIAGNOSIS — I10 Essential (primary) hypertension: Secondary | ICD-10-CM | POA: Insufficient documentation

## 2014-12-06 DIAGNOSIS — Z79899 Other long term (current) drug therapy: Secondary | ICD-10-CM | POA: Diagnosis not present

## 2014-12-06 DIAGNOSIS — C9111 Chronic lymphocytic leukemia of B-cell type in remission: Secondary | ICD-10-CM | POA: Insufficient documentation

## 2014-12-06 DIAGNOSIS — R002 Palpitations: Secondary | ICD-10-CM | POA: Diagnosis not present

## 2014-12-06 DIAGNOSIS — C9110 Chronic lymphocytic leukemia of B-cell type not having achieved remission: Secondary | ICD-10-CM

## 2014-12-06 DIAGNOSIS — H9190 Unspecified hearing loss, unspecified ear: Secondary | ICD-10-CM | POA: Diagnosis not present

## 2014-12-06 DIAGNOSIS — Z7982 Long term (current) use of aspirin: Secondary | ICD-10-CM

## 2014-12-06 DIAGNOSIS — D649 Anemia, unspecified: Secondary | ICD-10-CM | POA: Diagnosis not present

## 2014-12-06 DIAGNOSIS — R161 Splenomegaly, not elsewhere classified: Secondary | ICD-10-CM | POA: Diagnosis not present

## 2014-12-06 DIAGNOSIS — E7800 Pure hypercholesterolemia, unspecified: Secondary | ICD-10-CM | POA: Diagnosis not present

## 2014-12-06 LAB — CBC WITH DIFFERENTIAL/PLATELET
BASOS ABS: 0.1 10*3/uL (ref 0–0.1)
EOS ABS: 0.1 10*3/uL (ref 0–0.7)
HEMATOCRIT: 38.2 % (ref 35.0–47.0)
Hemoglobin: 12.6 g/dL (ref 12.0–16.0)
Lymphocytes Relative: 69 %
Lymphs Abs: 7.9 10*3/uL — ABNORMAL HIGH (ref 1.0–3.6)
MCH: 28.9 pg (ref 26.0–34.0)
MCHC: 33 g/dL (ref 32.0–36.0)
MCV: 87.5 fL (ref 80.0–100.0)
MONO ABS: 0.6 10*3/uL (ref 0.2–0.9)
Neutro Abs: 2.5 10*3/uL (ref 1.4–6.5)
Neutrophils Relative %: 23 %
PLATELETS: 139 10*3/uL — AB (ref 150–440)
RBC: 4.37 MIL/uL (ref 3.80–5.20)
RDW: 15.5 % — AB (ref 11.5–14.5)
WBC: 11.2 10*3/uL — ABNORMAL HIGH (ref 3.6–11.0)

## 2014-12-06 LAB — COMPREHENSIVE METABOLIC PANEL
ALBUMIN: 4.5 g/dL (ref 3.5–5.0)
ALT: 16 U/L (ref 14–54)
AST: 22 U/L (ref 15–41)
Alkaline Phosphatase: 53 U/L (ref 38–126)
Anion gap: 9 (ref 5–15)
BUN: 23 mg/dL — AB (ref 6–20)
CHLORIDE: 99 mmol/L — AB (ref 101–111)
CO2: 26 mmol/L (ref 22–32)
Calcium: 9.6 mg/dL (ref 8.9–10.3)
Creatinine, Ser: 0.99 mg/dL (ref 0.44–1.00)
GFR calc Af Amer: 59 mL/min — ABNORMAL LOW (ref >60)
GFR calc non Af Amer: 51 mL/min — ABNORMAL LOW (ref >60)
GLUCOSE: 109 mg/dL — AB (ref 65–99)
POTASSIUM: 4 mmol/L (ref 3.5–5.1)
Sodium: 134 mmol/L — ABNORMAL LOW (ref 135–145)
Total Bilirubin: 1 mg/dL (ref 0.3–1.2)
Total Protein: 7.5 g/dL (ref 6.5–8.1)

## 2014-12-06 NOTE — Progress Notes (Signed)
Patient wants to know if she should continue taking her K+.

## 2014-12-06 NOTE — Progress Notes (Signed)
[place] @ [facility] Telephone:(336) [phone]  Fax:(336) [place]: Jun 13, 1928  MR#: [id number]  XHF#:[id number]  Patient Care Team: Albina Billet, MD as PCP - General (Internal Medicine)  CHIEF COMPLAINT:  Chief Complaint  Patient presents with  . OTHER   Oncology History   1. Ultrasound revealed abdominal lymphadenopathy(March, 2013) 2. Biopsy as well as peripheral blood be suggestive of chronic lymphocytic leukemia (April, 2013) 3. Started on bendamustin and Rituxan from May of 2013 4. Has finished her 6 cycles of chemotherapy with Rituxan and bendamustine (October 26, 2011) 5.recurrent and progressive disease with progressive anemia.  Patient was started on IBRUTANIB (March, 2016)  taking 140 mg 3 pills a day   6.  Patient was taken off   IBRUTANIB in August of 2016 because of persistent myelosuppression even with reducing dose   No flowsheet data found.  INTERVAL HISTORY: 81 year old lady with a history of chronic lymphocytic leukemia recently was started on Ibrutanib.  Patient is tolerating treatment very well be slightly reduced dose.  White count is decreased to 17,000 with decrease in lymphocyte overall patient is feeling better.  According to her there are more good days and bad days.  No diarrhea.  No rash.  No nausea.  No vomiting. Patient is here for further follow-up regarding chronic lymphocytic leukemia and anemia.  Patient is off all treatment.  Feeling stronger.  Ambulation is limited because of multiple other medical comorbidity.  No chills.  No fever.  Since last evaluation patient did not have any significant decline in performance status  REVIEW OF SYSTEMS:   GENERAL:  Feels good.  Active.  No fevers, sweats or weight loss. PERFORMANCE STATUS (ECOG): 01 HEENT:  No visual changes, runny nose, sore throat, mouth sores or tenderness. Lungs: No shortness of breath or cough.  No hemoptysis. Cardiac:  No chest pain, palpitations, orthopnea, or  PND. GI:  No nausea, vomiting, diarrhea, constipation, melena or hematochezia. GU:  No urgency, frequency, dysuria, or hematuria. Musculoskeletal:  No back pain.  No joint pain.  No muscle tenderness. Extremities:  No pain or swelling. Skin:  No rashes or skin changes. Neuro:  No headache, numbness or weakness, balance or coordination issues. Endocrine:  No diabetes, thyroid issues, hot flashes or night sweats. Psych:  No mood changes, depression or anxiety. Pain:  No focal pain. Review of systems:  All other systems reviewed and found to be negative.  PAST MEDICAL HISTORY: Past Medical History  Diagnosis Date  . Chronic lymphocytic leukemia ([place]) 06/06/2014  . Hypertension    Significant History/PMH:   Spleenomegaly:    Hepatomegaly:    Palpitations:    Hypertension:    Anemia:    Hypothyroidism:    Hard of Hearing:    htn:    high cholesterol:   Smoking History: Smoking History Never Smoked.(1)  PFSH: Comments: No family history of colorectal cancer, breast cancer, or ovarian cancer.  Comments: Does not smoke.  Does not drink.  Lives by herself  Additional Past Medical and Surgical History: Hypothyroidism.    Hyperlipidemia       ADVANCED DIRECTIVES: Patient does have advance care directive  HEALTH MAINTENANCE: Social History  Substance Use Topics  . Smoking status: Never Smoker   . Smokeless tobacco: None  . Alcohol Use: No     No Known Allergies  Current Outpatient Prescriptions  Medication Sig Dispense Refill  . aspirin EC 81 MG tablet Take 81 mg by mouth daily.    Marland Kitchen  atorvastatin (LIPITOR) 20 MG tablet     . diltiazem (CARDIZEM CD) 240 MG 24 hr capsule     . DULoxetine (CYMBALTA) 20 MG capsule Take 20 mg by mouth daily.  2  . levothyroxine (SYNTHROID, LEVOTHROID) 75 MCG tablet     . loperamide (IMODIUM) 1 MG/5ML solution Take 1 mg by mouth.    . loratadine (CLARITIN) 10 MG tablet Take 1 tablet (10 mg total) by mouth daily. 15 tablet 0  .  metroNIDAZOLE (FLAGYL) 500 MG tablet Take 1 tablet (500 mg total) by mouth 2 (two) times daily. 14 tablet 0  . potassium chloride (KLOR-CON 10) 10 MEQ tablet Take 1 tablet (10 mEq total) by mouth 2 (two) times daily. Please note new dose 60 tablet 1  . triamterene-hydrochlorothiazide (DYAZIDE) 37.5-25 MG per capsule      No current facility-administered medications for this visit.    OBJECTIVE:  Filed Vitals:   12/06/14 1037  BP: 136/56  Pulse: 77  Temp: 97.4 F (36.3 C)     Body mass index is 26.89 kg/(m^2).    ECOG FS:1 - Symptomatic but completely ambulatory  PHYSICAL EXAM:  General status: Performance status is good.  Patient has not lost significant weight No chills or fever no significant change in patient's performance status HEENT: No evidence of stomatitis.  Plains of runny nose. Sclera and conjunctivae :: No jaundice.   pale looking . Lungs: Air  entry equal on both sides.  No rhonchi.  No rales.  Cardiac: Heart sounds are normal.  No pericardial rub.  No murmur. Lymphatic system: Cervical, axillary, inguinal, lymph nodes not palpable GI: Abdomen is soft.  No ascites.  Liver spleen not palpable.  No tenderness.  Bowel sounds are within normal limit Lower extremity: No edema Neurological system: Higher functions, cranial nerves intact No evidence of peripheral neuropathy. Skin: No rash.  No ecchymosis.Marland Kitchen    LAB RESULTS:  Appointment on 12/06/2014  Component Date Value Ref Range Status  . WBC 12/06/2014 11.2* 3.6 - 11.0 K/uL Final  . RBC 12/06/2014 4.37  3.80 - 5.20 MIL/uL Final  . Hemoglobin 12/06/2014 12.6  12.0 - 16.0 g/dL Final  . HCT 12/06/2014 38.2  35.0 - 47.0 % Final  . MCV 12/06/2014 87.5  80.0 - 100.0 fL Final  . MCH 12/06/2014 28.9  26.0 - 34.0 pg Final  . MCHC 12/06/2014 33.0  32.0 - 36.0 g/dL Final  . RDW 12/06/2014 15.5* 11.5 - 14.5 % Final  . Platelets 12/06/2014 139* 150 - 440 K/uL Final  . Neutrophils Relative % 12/06/2014 23%   Final  . Neutro  Abs 12/06/2014 2.5  1.4 - 6.5 K/uL Final  . Lymphocytes Relative 12/06/2014 69%   Final  . Lymphs Abs 12/06/2014 7.9* 1.0 - 3.6 K/uL Final  . Monocytes Relative 12/06/2014 6%   Final  . Monocytes Absolute 12/06/2014 0.6  0.2 - 0.9 K/uL Final  . Eosinophils Relative 12/06/2014 1%   Final  . Eosinophils Absolute 12/06/2014 0.1  0 - 0.7 K/uL Final  . Basophils Relative 12/06/2014 1%   Final  . Basophils Absolute 12/06/2014 0.1  0 - 0.1 K/uL Final  . Smear Review 12/06/2014 SMEAR SCANNED   Corrected  . Sodium 12/06/2014 134* 135 - 145 mmol/L Final  . Potassium 12/06/2014 4.0  3.5 - 5.1 mmol/L Final  . Chloride 12/06/2014 99* 101 - 111 mmol/L Final  . CO2 12/06/2014 26  22 - 32 mmol/L Final  . Glucose, Bld 12/06/2014 109* 65 -  99 mg/dL Final  . BUN 12/06/2014 23* 6 - 20 mg/dL Final  . Creatinine, Ser 12/06/2014 0.99  0.44 - 1.00 mg/dL Final  . Calcium 12/06/2014 9.6  8.9 - 10.3 mg/dL Final  . Total Protein 12/06/2014 7.5  6.5 - 8.1 g/dL Final  . Albumin 12/06/2014 4.5  3.5 - 5.0 g/dL Final  . AST 12/06/2014 22  15 - 41 U/L Final  . ALT 12/06/2014 16  14 - 54 U/L Final  . Alkaline Phosphatase 12/06/2014 53  38 - 126 U/L Final  . Total Bilirubin 12/06/2014 1.0  0.3 - 1.2 mg/dL Final  . GFR calc non Af Amer 12/06/2014 51* >60 mL/min Final  . GFR calc Af Amer 12/06/2014 59* >60 mL/min Final   Comment: (NOTE) The eGFR has been calculated using the CKD EPI equation. This calculation has not been validated in all clinical situations. eGFR's persistently <60 mL/min signify possible Chronic Kidney Disease.   . Anion gap 12/06/2014 9  5 - 15 Final        ASSESSMENT: 81 year old lady with chronic lymphocytic leukemia now on Ibrutanib.   Hemoglobin has improved.  Patient's white count has stabilized with absolute neutrophil count of 1.5 At present time patient will be taken off IBRUTANIB  We will continue to monitor and observe the patient.  If patient has any recurrence of abnormality  related to chronic lymphocytic leukemia now will consider starting patient back on IBRUTANIB in lower doses.  MEDICAL DECISION MAKING:  Chronic lymphocytic leukemia. HBG   is stable. Platelet count is stable Appears to be in remission at present time  Patient expressed understanding and was in agreement with this plan. She also understands that She can call clinic at any time with any questions, concerns, or complaints.    No matching staging information was found for the patient.  Forest Gleason, MD   12/06/2014 11:00 AM

## 2015-03-11 ENCOUNTER — Inpatient Hospital Stay: Payer: Medicare Other | Attending: Oncology | Admitting: Oncology

## 2015-03-11 ENCOUNTER — Inpatient Hospital Stay: Payer: Medicare Other

## 2015-03-11 ENCOUNTER — Encounter: Payer: Self-pay | Admitting: Oncology

## 2015-03-11 VITALS — BP 123/65 | HR 85 | Temp 97.4°F | Resp 18 | Wt 145.1 lb

## 2015-03-11 DIAGNOSIS — D72829 Elevated white blood cell count, unspecified: Secondary | ICD-10-CM

## 2015-03-11 DIAGNOSIS — R161 Splenomegaly, not elsewhere classified: Secondary | ICD-10-CM

## 2015-03-11 DIAGNOSIS — C9110 Chronic lymphocytic leukemia of B-cell type not having achieved remission: Secondary | ICD-10-CM

## 2015-03-11 DIAGNOSIS — E785 Hyperlipidemia, unspecified: Secondary | ICD-10-CM | POA: Diagnosis not present

## 2015-03-11 DIAGNOSIS — Z79899 Other long term (current) drug therapy: Secondary | ICD-10-CM

## 2015-03-11 DIAGNOSIS — R42 Dizziness and giddiness: Secondary | ICD-10-CM

## 2015-03-11 DIAGNOSIS — D649 Anemia, unspecified: Secondary | ICD-10-CM

## 2015-03-11 DIAGNOSIS — I10 Essential (primary) hypertension: Secondary | ICD-10-CM

## 2015-03-11 DIAGNOSIS — E039 Hypothyroidism, unspecified: Secondary | ICD-10-CM | POA: Diagnosis not present

## 2015-03-11 DIAGNOSIS — Z7982 Long term (current) use of aspirin: Secondary | ICD-10-CM | POA: Diagnosis not present

## 2015-03-11 DIAGNOSIS — R160 Hepatomegaly, not elsewhere classified: Secondary | ICD-10-CM

## 2015-03-11 LAB — COMPREHENSIVE METABOLIC PANEL
ALBUMIN: 4.6 g/dL (ref 3.5–5.0)
ALK PHOS: 55 U/L (ref 38–126)
ALT: 15 U/L (ref 14–54)
ANION GAP: 7 (ref 5–15)
AST: 20 U/L (ref 15–41)
BILIRUBIN TOTAL: 1 mg/dL (ref 0.3–1.2)
BUN: 29 mg/dL — ABNORMAL HIGH (ref 6–20)
CALCIUM: 9.2 mg/dL (ref 8.9–10.3)
CO2: 24 mmol/L (ref 22–32)
Chloride: 104 mmol/L (ref 101–111)
Creatinine, Ser: 1.18 mg/dL — ABNORMAL HIGH (ref 0.44–1.00)
GFR calc Af Amer: 48 mL/min — ABNORMAL LOW (ref >60)
GFR calc non Af Amer: 41 mL/min — ABNORMAL LOW (ref >60)
GLUCOSE: 115 mg/dL — AB (ref 65–99)
Potassium: 4 mmol/L (ref 3.5–5.1)
Sodium: 135 mmol/L (ref 135–145)
TOTAL PROTEIN: 7.4 g/dL (ref 6.5–8.1)

## 2015-03-11 LAB — CBC WITH DIFFERENTIAL/PLATELET
BASOS ABS: 0.1 10*3/uL (ref 0–0.1)
Basophils Relative: 0 %
Eosinophils Absolute: 0.2 10*3/uL (ref 0–0.7)
Eosinophils Relative: 1 %
HEMATOCRIT: 33.3 % — AB (ref 35.0–47.0)
HEMOGLOBIN: 10.9 g/dL — AB (ref 12.0–16.0)
LYMPHS ABS: 24.5 10*3/uL — AB (ref 1.0–3.6)
MCH: 30 pg (ref 26.0–34.0)
MCHC: 32.7 g/dL (ref 32.0–36.0)
MCV: 91.7 fL (ref 80.0–100.0)
MONO ABS: 0.9 10*3/uL (ref 0.2–0.9)
NEUTROS ABS: 2.5 10*3/uL (ref 1.4–6.5)
Neutrophils Relative %: 9 %
Platelets: 107 10*3/uL — ABNORMAL LOW (ref 150–440)
RBC: 3.63 MIL/uL — ABNORMAL LOW (ref 3.80–5.20)
RDW: 16.1 % — ABNORMAL HIGH (ref 11.5–14.5)
WBC: 28.2 10*3/uL — ABNORMAL HIGH (ref 3.6–11.0)

## 2015-03-11 LAB — LACTATE DEHYDROGENASE: LDH: 384 U/L — ABNORMAL HIGH (ref 98–192)

## 2015-03-11 NOTE — Progress Notes (Signed)
[place] @ [facility] Telephone:(336) [phone]  Fax:(336) [place]: Oct 04, 1928  MR#: [id number]  QMV#:[id number]  Patient Care Team: Albina Billet, MD as PCP - General (Internal Medicine)  CHIEF COMPLAINT:  Chief Complaint  Patient presents with  . Chronic lymphocytic leukemia   Oncology History   1. Ultrasound revealed abdominal lymphadenopathy(March, 2013) 2. Biopsy as well as peripheral blood be suggestive of chronic lymphocytic leukemia (April, 2013) 3. Started on bendamustin and Rituxan from May of 2013 4. Has finished her 6 cycles of chemotherapy with Rituxan and bendamustine (October 26, 2011) 5.recurrent and progressive disease with progressive anemia.  Patient was started on IBRUTANIB (March, 2016)  taking 140 mg 3 pills a day   6.  Patient was taken off   IBRUTANIB in August of 2016 because of persistent myelosuppression even with reducing dose 7.  Because of rising WBC count and anemia patient would be started back on a lower dose of improved on   ibrutanib  140 mg 2 tablets a day  No flowsheet data found.  INTERVAL HISTORY: 82 year old lady with a history of chronic lymphocytic leukemia recently was started on Ibrutanib.  Patient is here for ongoing evaluation and treatment consideration. Complaining of dizziness.  Hemoglobin has dropped to 10 g.  White count is slowly rising. Here for further follow-up regarding chronic lymphocytic leukemia  REVIEW OF SYSTEMS:   GENERAL:  Feels good.  Active.  No fevers, sweats or weight loss. PERFORMANCE STATUS (ECOG): 01 HEENT:  No visual changes, runny nose, sore throat, mouth sores or tenderness. Lungs: No shortness of breath or cough.  No hemoptysis. Cardiac:  No chest pain, palpitations, orthopnea, or PND. GI:  No nausea, vomiting, diarrhea, constipation, melena or hematochezia. GU:  No urgency, frequency, dysuria, or hematuria. Musculoskeletal:  No back pain.  No joint pain.  No muscle  tenderness. Extremities:  No pain or swelling. Skin:  No rashes or skin changes. Neuro:  No headache, numbness or weakness, balance or coordination issues. Endocrine:  No diabetes, thyroid issues, hot flashes or night sweats. Psych:  No mood changes, depression or anxiety. Pain:  No focal pain. Review of systems:  All other systems reviewed and found to be negative.  PAST MEDICAL HISTORY: Past Medical History  Diagnosis Date  . Chronic lymphocytic leukemia ([place]) 06/06/2014  . Hypertension    Significant History/PMH:   Spleenomegaly:    Hepatomegaly:    Palpitations:    Hypertension:    Anemia:    Hypothyroidism:    Hard of Hearing:    htn:    high cholesterol:   Smoking History: Smoking History Never Smoked.(1)  PFSH: Comments: No family history of colorectal cancer, breast cancer, or ovarian cancer.  Comments: Does not smoke.  Does not drink.  Lives by herself  Additional Past Medical and Surgical History: Hypothyroidism.    Hyperlipidemia       ADVANCED DIRECTIVES: Patient does have advance care directive  HEALTH MAINTENANCE: Social History  Substance Use Topics  . Smoking status: Never Smoker   . Smokeless tobacco: None  . Alcohol Use: No     No Known Allergies  Current Outpatient Prescriptions  Medication Sig Dispense Refill  . aspirin EC 81 MG tablet Take 81 mg by mouth daily.    Marland Kitchen atorvastatin (LIPITOR) 20 MG tablet Take 20 mg by mouth daily at 6 PM.     . diltiazem (CARDIZEM CD) 240 MG 24 hr capsule Take 240 mg by mouth  daily.     . levothyroxine (SYNTHROID, LEVOTHROID) 75 MCG tablet Take 75 mcg by mouth daily before breakfast.     . triamterene-hydrochlorothiazide (DYAZIDE) 37.5-25 MG capsule      No current facility-administered medications for this visit.    OBJECTIVE:  Filed Vitals:   03/11/15 1017 03/11/15 1018  BP: 123/65   Pulse: 85   Temp: 97.4 F (36.3 C)   Resp:  18     Body mass index is 26.53 kg/(m^2).    ECOG FS:1 -  Symptomatic but completely ambulatory  PHYSICAL EXAM:  General status: Performance status is good.  Patient has not lost significant weight No chills or fever no significant change in patient's performance status HEENT: No evidence of stomatitis.  Plains of runny nose. Sclera and conjunctivae :: No jaundice.   pale looking . Lungs: Air  entry equal on both sides.  No rhonchi.  No rales.  Cardiac: Heart sounds are normal.  No pericardial rub.  No murmur. Lymphatic system: Cervical, axillary, inguinal, lymph nodes not palpable GI: Abdomen is soft.  No ascites.  Liver spleen not palpable.  No tenderness.  Bowel sounds are within normal limit Lower extremity: No edema Neurological system: Higher functions, cranial nerves intact No evidence of peripheral neuropathy. Skin: No rash.  No ecchymosis.Marland Kitchen    LAB RESULTS:  Appointment on 03/11/2015  Component Date Value Ref Range Status  . WBC 03/11/2015 28.2* 3.6 - 11.0 K/uL Final  . RBC 03/11/2015 3.63* 3.80 - 5.20 MIL/uL Final  . Hemoglobin 03/11/2015 10.9* 12.0 - 16.0 g/dL Final  . HCT 03/11/2015 33.3* 35.0 - 47.0 % Final  . MCV 03/11/2015 91.7  80.0 - 100.0 fL Final  . MCH 03/11/2015 30.0  26.0 - 34.0 pg Final  . MCHC 03/11/2015 32.7  32.0 - 36.0 g/dL Final  . RDW 03/11/2015 16.1* 11.5 - 14.5 % Final  . Platelets 03/11/2015 107* 150 - 440 K/uL Final  . Neutrophils Relative % 03/11/2015 9%   Final  . Neutro Abs 03/11/2015 2.5  1.4 - 6.5 K/uL Final  . Lymphocytes Relative 03/11/2015 87%   Final  . Lymphs Abs 03/11/2015 24.5* 1.0 - 3.6 K/uL Final  . Monocytes Relative 03/11/2015 3%   Final  . Monocytes Absolute 03/11/2015 0.9  0.2 - 0.9 K/uL Final  . Eosinophils Relative 03/11/2015 1%   Final  . Eosinophils Absolute 03/11/2015 0.2  0 - 0.7 K/uL Final  . Basophils Relative 03/11/2015 0%   Final  . Basophils Absolute 03/11/2015 0.1  0 - 0.1 K/uL Final  . Smear Review 03/11/2015 SMEAR SCANNED   Final  . Sodium 03/11/2015 135  135 - 145  mmol/L Final  . Potassium 03/11/2015 4.0  3.5 - 5.1 mmol/L Final  . Chloride 03/11/2015 104  101 - 111 mmol/L Final  . CO2 03/11/2015 24  22 - 32 mmol/L Final  . Glucose, Bld 03/11/2015 115* 65 - 99 mg/dL Final  . BUN 03/11/2015 29* 6 - 20 mg/dL Final  . Creatinine, Ser 03/11/2015 1.18* 0.44 - 1.00 mg/dL Final  . Calcium 03/11/2015 9.2  8.9 - 10.3 mg/dL Final  . Total Protein 03/11/2015 7.4  6.5 - 8.1 g/dL Final  . Albumin 03/11/2015 4.6  3.5 - 5.0 g/dL Final  . AST 03/11/2015 20  15 - 41 U/L Final  . ALT 03/11/2015 15  14 - 54 U/L Final  . Alkaline Phosphatase 03/11/2015 55  38 - 126 U/L Final  . Total Bilirubin 03/11/2015 1.0  0.3 - 1.2 mg/dL Final  . GFR calc non Af Amer 03/11/2015 41* >60 mL/min Final  . GFR calc Af Amer 03/11/2015 48* >60 mL/min Final   Comment: (NOTE) The eGFR has been calculated using the CKD EPI equation. This calculation has not been validated in all clinical situations. eGFR's persistently <60 mL/min signify possible Chronic Kidney Disease.   . Anion gap 03/11/2015 7  5 - 15 Final  . LDH 03/11/2015 384* 98 - 192 U/L Final        ASSESSMENT: 82 year old lady with chronic lymphocytic leukemia now on Ibrutanib.   Rising WBC count and hemoglobin is dropping to 10 g patient is becoming symptomatic most likely due to anemia LDH has been high   MEDICAL DECISION MAKING:  Chronic lymphocytic leukemia. 2.  As continue or hold Dyazide as blood pressure is low Hypothyroidism to be managed by primary care physician Consider going back on improved unable to tablets of 140 mg daily and recheck CBC in few weeks  Total duration of visit was 25  minutes.  50% or more time was spent in counseling patient and family regarding prognosis and options of treatment and available resources About discontinued his Dyazide and starting her back on i  ibrutanib   Patient expressed understanding and was in agreement with this plan. She also understands that She can call  clinic at any time with any questions, concerns, or complaints.    No matching staging information was found for the patient.  Forest Gleason, MD   03/11/2015 10:44 AM

## 2015-03-26 ENCOUNTER — Telehealth: Payer: Self-pay | Admitting: *Deleted

## 2015-03-26 ENCOUNTER — Other Ambulatory Visit: Payer: Self-pay | Admitting: *Deleted

## 2015-03-26 DIAGNOSIS — C9110 Chronic lymphocytic leukemia of B-cell type not having achieved remission: Secondary | ICD-10-CM

## 2015-03-26 MED ORDER — IBRUTINIB 140 MG PO CAPS: 280.0000 mg | ORAL_CAPSULE | Freq: Every day | ORAL | Status: DC

## 2015-03-26 NOTE — Telephone Encounter (Signed)
Patient called to ask about prescription for Imbruvica that was supposed to be called in for her after her last appointment a couple of weeks ago.  Per Wynelle Link, MD did not instruct her to call, however, after speaking with him today, will call prescription to [facility] and they will contact patient regarding delivery.  Informed patient.  Patient verbalized understanding.

## 2015-04-10 ENCOUNTER — Other Ambulatory Visit: Payer: Medicare Other

## 2015-04-10 ENCOUNTER — Inpatient Hospital Stay (HOSPITAL_BASED_OUTPATIENT_CLINIC_OR_DEPARTMENT_OTHER): Payer: Medicare Other | Admitting: Oncology

## 2015-04-10 ENCOUNTER — Ambulatory Visit: Payer: Medicare Other | Admitting: Oncology

## 2015-04-10 ENCOUNTER — Inpatient Hospital Stay: Payer: Medicare Other | Attending: Oncology

## 2015-04-10 ENCOUNTER — Encounter: Payer: Self-pay | Admitting: Oncology

## 2015-04-10 VITALS — BP 166/61 | HR 81 | Temp 97.8°F | Resp 18 | Wt 147.2 lb

## 2015-04-10 DIAGNOSIS — Z9221 Personal history of antineoplastic chemotherapy: Secondary | ICD-10-CM | POA: Diagnosis not present

## 2015-04-10 DIAGNOSIS — R162 Hepatomegaly with splenomegaly, not elsewhere classified: Secondary | ICD-10-CM

## 2015-04-10 DIAGNOSIS — E7800 Pure hypercholesterolemia, unspecified: Secondary | ICD-10-CM

## 2015-04-10 DIAGNOSIS — C9110 Chronic lymphocytic leukemia of B-cell type not having achieved remission: Secondary | ICD-10-CM | POA: Insufficient documentation

## 2015-04-10 DIAGNOSIS — D649 Anemia, unspecified: Secondary | ICD-10-CM

## 2015-04-10 DIAGNOSIS — R002 Palpitations: Secondary | ICD-10-CM | POA: Insufficient documentation

## 2015-04-10 DIAGNOSIS — E039 Hypothyroidism, unspecified: Secondary | ICD-10-CM

## 2015-04-10 DIAGNOSIS — Z7982 Long term (current) use of aspirin: Secondary | ICD-10-CM | POA: Insufficient documentation

## 2015-04-10 DIAGNOSIS — R42 Dizziness and giddiness: Secondary | ICD-10-CM | POA: Diagnosis not present

## 2015-04-10 DIAGNOSIS — I10 Essential (primary) hypertension: Secondary | ICD-10-CM

## 2015-04-10 DIAGNOSIS — Z79899 Other long term (current) drug therapy: Secondary | ICD-10-CM | POA: Insufficient documentation

## 2015-04-10 LAB — CBC WITH DIFFERENTIAL/PLATELET
BASOS ABS: 0.1 10*3/uL (ref 0–0.1)
EOS ABS: 0.1 10*3/uL (ref 0–0.7)
HEMATOCRIT: 30.5 % — AB (ref 35.0–47.0)
HEMOGLOBIN: 9.9 g/dL — AB (ref 12.0–16.0)
Lymphocytes Relative: 88 %
Lymphs Abs: 29.1 10*3/uL — ABNORMAL HIGH (ref 1.0–3.6)
MCH: 30.6 pg (ref 26.0–34.0)
MCHC: 32.3 g/dL (ref 32.0–36.0)
MCV: 94.6 fL (ref 80.0–100.0)
MONO ABS: 1.1 10*3/uL — AB (ref 0.2–0.9)
Neutro Abs: 2.9 10*3/uL (ref 1.4–6.5)
Platelets: 114 10*3/uL — ABNORMAL LOW (ref 150–440)
RBC: 3.23 MIL/uL — AB (ref 3.80–5.20)
RDW: 17.3 % — ABNORMAL HIGH (ref 11.5–14.5)
WBC: 33.3 10*3/uL — ABNORMAL HIGH (ref 3.6–11.0)

## 2015-04-10 LAB — COMPREHENSIVE METABOLIC PANEL
ALBUMIN: 4.8 g/dL (ref 3.5–5.0)
ALK PHOS: 53 U/L (ref 38–126)
ALT: 12 U/L — AB (ref 14–54)
AST: 20 U/L (ref 15–41)
Anion gap: 9 (ref 5–15)
BILIRUBIN TOTAL: 1.4 mg/dL — AB (ref 0.3–1.2)
BUN: 13 mg/dL (ref 6–20)
CALCIUM: 9.2 mg/dL (ref 8.9–10.3)
CO2: 23 mmol/L (ref 22–32)
CREATININE: 0.79 mg/dL (ref 0.44–1.00)
Chloride: 103 mmol/L (ref 101–111)
GFR calc Af Amer: >60 mL/min (ref >60)
GFR calc non Af Amer: >60 mL/min (ref >60)
GLUCOSE: 104 mg/dL — AB (ref 65–99)
Potassium: 3.9 mmol/L (ref 3.5–5.1)
SODIUM: 135 mmol/L (ref 135–145)
TOTAL PROTEIN: 7.5 g/dL (ref 6.5–8.1)

## 2015-04-10 NOTE — Progress Notes (Signed)
[place] @ [facility] [phone]  Fax:([phone]) [place]: 04-19-1928  MR#: [id number]  VZD#:[id number]  Patient Care Team: Albina Billet, MD as PCP - General (Internal Medicine)  CHIEF COMPLAINT:  Chief Complaint  Patient presents with  . Chronic lymphocytic leukemia   Oncology History   1. Ultrasound revealed abdominal lymphadenopathy(March, 2013) 2. Biopsy as well as peripheral blood be suggestive of chronic lymphocytic leukemia (April, 2013) 3. Started on bendamustin and Rituxan from May of 2013 4. Has finished her 6 cycles of chemotherapy with Rituxan and bendamustine (October 26, 2011) 5.recurrent and progressive disease with progressive anemia.  Patient was started on IBRUTANIB (March, 2016)  taking 140 mg 3 pills a day   6.  Patient was taken off   IBRUTANIB in August of 2016 because of persistent myelosuppression even with reducing dose 7.  Because of rising WBC count and anemia patient would be started back on a lower dose of improved on   ibrutanib  140 mg 2 tablets a day  No flowsheet data found.  INTERVAL HISTORY: 82 year old lady with a history of chronic lymphocytic leukemia recently was started on Ibrutanib.  Patient is here for ongoing evaluation and treatment consideration. Complaining of dizziness.  Hemoglobin has dropped to 10 g.  White count is slowly rising. Here for further follow-up regarding chronic lymphocytic leukemia  REVIEW OF SYSTEMS:   GENERAL:  Feels good.  Active.  No fevers, sweats or weight loss. PERFORMANCE STATUS (ECOG): 01 HEENT:  No visual changes, runny nose, sore throat, mouth sores or tenderness. Lungs: No shortness of breath or cough.  No hemoptysis. Cardiac:  No chest pain, palpitations, orthopnea, or PND. GI:  No nausea, vomiting, diarrhea, constipation, melena or hematochezia. GU:  No urgency, frequency, dysuria, or hematuria. Musculoskeletal:  No back pain.  No joint pain.  No muscle  tenderness. Extremities:  No pain or swelling. Skin:  No rashes or skin changes. Neuro:  No headache, numbness or weakness, balance or coordination issues. Endocrine:  No diabetes, thyroid issues, hot flashes or night sweats. Psych:  No mood changes, depression or anxiety. Pain:  No focal pain. Review of systems:  All other systems reviewed and found to be negative.  PAST MEDICAL HISTORY: Past Medical History  Diagnosis Date  . Chronic lymphocytic leukemia ([place]) 06/06/2014  . Hypertension    Significant History/PMH:   Spleenomegaly:    Hepatomegaly:    Palpitations:    Hypertension:    Anemia:    Hypothyroidism:    Hard of Hearing:    htn:    high cholesterol:   Smoking History: Smoking History Never Smoked.(1)  PFSH: Comments: No family history of colorectal cancer, breast cancer, or ovarian cancer.  Comments: Does not smoke.  Does not drink.  Lives by herself  Additional Past Medical and Surgical History: Hypothyroidism.    Hyperlipidemia       ADVANCED DIRECTIVES: Patient does have advance care directive  HEALTH MAINTENANCE: Social History  Substance Use Topics  . Smoking status: Never Smoker   . Smokeless tobacco: None  . Alcohol Use: No     No Known Allergies  Current Outpatient Prescriptions  Medication Sig Dispense Refill  . aspirin EC 81 MG tablet Take 81 mg by mouth daily.    Marland Kitchen atorvastatin (LIPITOR) 20 MG tablet Take 20 mg by mouth daily at 6 PM.     . diltiazem (CARDIZEM CD) 240 MG 24 hr capsule Take 240 mg by mouth  daily.     . ibrutinib (IMBRUVICA) 140 MG capsul Take 2 capsules (280 mg total) by mouth daily. 60 capsule 3  . levothyroxine (SYNTHROID, LEVOTHROID) 75 MCG tablet Take 75 mcg by mouth daily before breakfast.     . triamterene-hydrochlorothiazide (DYAZIDE) 37.5-25 MG capsule      No current facility-administered medications for this visit.    OBJECTIVE:  Filed Vitals:   04/10/15 1144  BP: 166/61  Pulse: 81  Temp: 97.8  F (36.6 C)  Resp: 18     Body mass index is 26.91 kg/(m^2).    ECOG FS:1 - Symptomatic but completely ambulatory  PHYSICAL EXAM:  General status: Performance status is good.  Patient has not lost significant weight No chills or fever no significant change in patient's performance status HEENT: No evidence of stomatitis.  Plains of runny nose. Sclera and conjunctivae :: No jaundice.   pale looking . Lungs: Air  entry equal on both sides.  No rhonchi.  No rales.  Cardiac: Heart sounds are normal.  No pericardial rub.  No murmur. Lymphatic system: Cervical, axillary, inguinal, lymph nodes not palpable GI: Abdomen is soft.  No ascites.  Liver spleen not palpable.  No tenderness.  Bowel sounds are within normal limit Lower extremity: No edema Neurological system: Higher functions, cranial nerves intact No evidence of peripheral neuropathy. Skin: No rash.  No ecchymosis.Marland Kitchen    LAB RESULTS:  Appointment on 04/10/2015  Component Date Value Ref Range Status  . WBC 04/10/2015 33.3* 3.6 - 11.0 K/uL Final  . RBC 04/10/2015 3.23* 3.80 - 5.20 MIL/uL Final  . Hemoglobin 04/10/2015 9.9* 12.0 - 16.0 g/dL Final  . HCT 20/44/2443 30.5* 35.0 - 47.0 % Final  . MCV 04/10/2015 94.6  80.0 - 100.0 fL Final  . MCH 04/10/2015 30.6  26.0 - 34.0 pg Final  . MCHC 04/10/2015 32.3  32.0 - 36.0 g/dL Final  . RDW 65/18/2204 17.3* 11.5 - 14.5 % Final  . Platelets 04/10/2015 114* 150 - 440 K/uL Final   PLATELET COUNT CONFIRMED BY SMEAR  . Neutrophils Relative % 04/10/2015 9%   Final  . Neutro Abs 04/10/2015 2.9  1.4 - 6.5 K/uL Final  . Lymphocytes Relative 04/10/2015 88%   Final  . Lymphs Abs 04/10/2015 29.1* 1.0 - 3.6 K/uL Final  . Monocytes Relative 04/10/2015 3%   Final  . Monocytes Absolute 04/10/2015 1.1* 0.2 - 0.9 K/uL Final  . Eosinophils Relative 04/10/2015 0%   Final  . Eosinophils Absolute 04/10/2015 0.1  0 - 0.7 K/uL Final  . Basophils Relative 04/10/2015 0%   Final  . Basophils Absolute  04/10/2015 0.1  0 - 0.1 K/uL Final  . Sodium 04/10/2015 135  135 - 145 mmol/L Final  . Potassium 04/10/2015 3.9  3.5 - 5.1 mmol/L Final  . Chloride 04/10/2015 103  101 - 111 mmol/L Final  . CO2 04/10/2015 23  22 - 32 mmol/L Final  . Glucose, Bld 04/10/2015 104* 65 - 99 mg/dL Final  . BUN 29/85/3921 13  6 - 20 mg/dL Final  . Creatinine, Ser 04/10/2015 0.79  0.44 - 1.00 mg/dL Final  . Calcium 29/98/8589 9.2  8.9 - 10.3 mg/dL Final  . Total Protein 04/10/2015 7.5  6.5 - 8.1 g/dL Final  . Albumin 88/71/5509 4.8  3.5 - 5.0 g/dL Final  . AST 57/36/1160 20  15 - 41 U/L Final  . ALT 04/10/2015 12* 14 - 54 U/L Final  . Alkaline Phosphatase 04/10/2015 53  38 -  126 U/L Final  . Total Bilirubin 04/10/2015 1.4* 0.3 - 1.2 mg/dL Final  . GFR calc non Af Amer 04/10/2015 >60  >60 mL/min Final  . GFR calc Af Amer 04/10/2015 >60  >60 mL/min Final   Comment: (NOTE) The eGFR has been calculated using the CKD EPI equation. This calculation has not been validated in all clinical situations. eGFR's persistently <60 mL/min signify possible Chronic Kidney Disease.   . Anion gap 04/10/2015 9  5 - 15 Final        ASSESSMENT: 82 year old lady with chronic lymphocytic leukemia now on Ibrutanib.   Rising WBC count and hemoglobin is dropping to 10 g patient is becoming symptomatic most likely due to anemia LDH has been high Patient has started Ibrutanib 2 tablets a day.  Tolerating well.  Seen any significant reduction in no blood count yet.  But no diarrhea.  No rash. No chills or fever   MEDICAL DECISION MAKING:  Chronic lym phocytic leukemia. On Ibrutanib Blood pressure is improved after patient was taken off Dyazide Continue with return he been the same dose patient is tolerating very well reevaluation in 4 weeks  All lab data has been reviewed white count is still high is 33,000.  Hemoglobin remained stable (low but stable) Patient expressed understanding and was in agreement with this plan. She  also understands that She can call clinic at any time with any questions, concerns, or complaints.    No matching staging information was found for the patient.  Forest Gleason, MD   04/10/2015 5:04 PM

## 2015-05-15 ENCOUNTER — Inpatient Hospital Stay: Payer: Medicare Other | Attending: Oncology

## 2015-05-15 ENCOUNTER — Encounter: Payer: Self-pay | Admitting: Oncology

## 2015-05-15 ENCOUNTER — Inpatient Hospital Stay (HOSPITAL_BASED_OUTPATIENT_CLINIC_OR_DEPARTMENT_OTHER): Payer: Medicare Other | Admitting: Oncology

## 2015-05-15 VITALS — BP 154/69 | HR 89 | Temp 97.6°F | Resp 18 | Wt 147.4 lb

## 2015-05-15 DIAGNOSIS — E039 Hypothyroidism, unspecified: Secondary | ICD-10-CM | POA: Insufficient documentation

## 2015-05-15 DIAGNOSIS — E7800 Pure hypercholesterolemia, unspecified: Secondary | ICD-10-CM | POA: Insufficient documentation

## 2015-05-15 DIAGNOSIS — R162 Hepatomegaly with splenomegaly, not elsewhere classified: Secondary | ICD-10-CM | POA: Insufficient documentation

## 2015-05-15 DIAGNOSIS — C9110 Chronic lymphocytic leukemia of B-cell type not having achieved remission: Secondary | ICD-10-CM

## 2015-05-15 DIAGNOSIS — R7989 Other specified abnormal findings of blood chemistry: Secondary | ICD-10-CM

## 2015-05-15 DIAGNOSIS — Z7982 Long term (current) use of aspirin: Secondary | ICD-10-CM | POA: Insufficient documentation

## 2015-05-15 DIAGNOSIS — D649 Anemia, unspecified: Secondary | ICD-10-CM | POA: Diagnosis not present

## 2015-05-15 DIAGNOSIS — I10 Essential (primary) hypertension: Secondary | ICD-10-CM | POA: Insufficient documentation

## 2015-05-15 DIAGNOSIS — Z79899 Other long term (current) drug therapy: Secondary | ICD-10-CM | POA: Diagnosis not present

## 2015-05-15 DIAGNOSIS — R002 Palpitations: Secondary | ICD-10-CM

## 2015-05-15 LAB — CBC WITH DIFFERENTIAL/PLATELET
BASOS PCT: 1 %
Basophils Absolute: 0.1 10*3/uL (ref 0–0.1)
EOS ABS: 0.1 10*3/uL (ref 0–0.7)
EOS PCT: 1 %
HCT: 35.7 % (ref 35.0–47.0)
HEMOGLOBIN: 11.9 g/dL — AB (ref 12.0–16.0)
LYMPHS ABS: 8.3 10*3/uL — AB (ref 1.0–3.6)
Lymphocytes Relative: 81 %
MCH: 29.8 pg (ref 26.0–34.0)
MCHC: 33.4 g/dL (ref 32.0–36.0)
MCV: 89.4 fL (ref 80.0–100.0)
MONO ABS: 0.4 10*3/uL (ref 0.2–0.9)
MONOS PCT: 4 %
NEUTROS PCT: 13 %
Neutro Abs: 1.3 10*3/uL — ABNORMAL LOW (ref 1.4–6.5)
PLATELETS: 130 10*3/uL — AB (ref 150–440)
RBC: 4 MIL/uL (ref 3.80–5.20)
RDW: 16.8 % — AB (ref 11.5–14.5)
WBC: 10.2 10*3/uL (ref 3.6–11.0)

## 2015-05-15 LAB — COMPREHENSIVE METABOLIC PANEL
ALBUMIN: 4.4 g/dL (ref 3.5–5.0)
ALT: 17 U/L (ref 14–54)
ANION GAP: 10 (ref 5–15)
AST: 25 U/L (ref 15–41)
Alkaline Phosphatase: 42 U/L (ref 38–126)
BUN: 13 mg/dL (ref 6–20)
CALCIUM: 9.4 mg/dL (ref 8.9–10.3)
CHLORIDE: 105 mmol/L (ref 101–111)
CO2: 25 mmol/L (ref 22–32)
Creatinine, Ser: 0.84 mg/dL (ref 0.44–1.00)
GFR calc non Af Amer: >60 mL/min (ref >60)
GLUCOSE: 97 mg/dL (ref 65–99)
POTASSIUM: 3.5 mmol/L (ref 3.5–5.1)
SODIUM: 140 mmol/L (ref 135–145)
Total Bilirubin: 1 mg/dL (ref 0.3–1.2)
Total Protein: 7.1 g/dL (ref 6.5–8.1)

## 2015-05-15 LAB — MAGNESIUM: Magnesium: 1.8 mg/dL (ref 1.7–2.4)

## 2015-05-18 ENCOUNTER — Encounter: Payer: Self-pay | Admitting: Oncology

## 2015-05-18 NOTE — Progress Notes (Signed)
[place] @ [facility] [phone]  Fax:([phone]) [place]: Apr 18, 1928  MR#: [id number]  FXT#:[id number]  Patient Care Team: Albina Billet, MD as PCP - General (Internal Medicine)  CHIEF COMPLAINT:  Chief Complaint  Patient presents with  . CLL   Oncology History   1. Ultrasound revealed abdominal lymphadenopathy(March, 2013) 2. Biopsy as well as peripheral blood be suggestive of chronic lymphocytic leukemia (April, 2013) 3. Started on bendamustin and Rituxan from May of 2013 4. Has finished her 6 cycles of chemotherapy with Rituxan and bendamustine (October 26, 2011) 5.recurrent and progressive disease with progressive anemia.  Patient was started on IBRUTANIB (March, 2016)  taking 140 mg 2 pills a day   6.  Patient was taken off   IBRUTANIB in August of 2016 because of persistent myelosuppression even with reducing dose 7.  Because of rising WBC count and anemia patient would be started back on a lower dose of improved on   ibrutanib  140 mg 2 tablets a day  No flowsheet data found.  INTERVAL HISTORY: 82 year old lady with a history of chronic lymphocytic leukemia recently was started on Ibrutanib.  Patient is here for ongoing evaluation and treatment consideration. Patient is tolerating the reduced dose of Ibrutanib 2tabs/115m daily.  Hemoglobin is improved to 11.9 and white count has dropped to 10,000.  No diarrhea.  Occasional dizziness persist Here for further follow-up and treatment consideration No rash no diarrhea Patient is here for ongoing evaluation and treatment consideration   REVIEW OF SYSTEMS:   GENERAL:  Feels good.  Active.  No fevers, sweats or weight loss. PERFORMANCE STATUS (ECOG): 01 HEENT:  No visual changes, runny nose, sore throat, mouth sores or tenderness. Lungs: No shortness of breath or cough.  No hemoptysis. Cardiac:  No chest pain, palpitations, orthopnea, or PND. GI:  No nausea, vomiting, diarrhea, constipation, melena  or hematochezia. GU:  No urgency, frequency, dysuria, or hematuria. Musculoskeletal:  No back pain.  No joint pain.  No muscle tenderness. Extremities:  No pain or swelling. Skin:  No rashes or skin changes. Neuro:  No headache, numbness or weakness, balance or coordination issues. Endocrine:  No diabetes, thyroid issues, hot flashes or night sweats. Psych:  No mood changes, depression or anxiety. Pain:  No focal pain. Review of systems:  All other systems reviewed and found to be negative.  PAST MEDICAL HISTORY: Past Medical History  Diagnosis Date  . [place] 06/06[place]  . Hypertension    Significant History/PMH:   Spleenomegaly:    Hepatomegaly:    Palpitations:    Hypertension:    Anemia:    Hypothyroidism:    Hard of Hearing:    htn:    high cholesterol:   Smoking History: Smoking History Never Smoked.(1)  PFSH: Comments: No family history of colorectal cancer, breast cancer, or ovarian cancer.  Comments: Does not smoke.  Does not drink.  Lives by herself  Additional Past Medical and Surgical History: Hypothyroidism.    Hyperlipidemia       ADVANCED DIRECTIVES: Patient does have advance care directive  HEALTH MAINTENANCE: Social History  Substance Use Topics  . Smoking status: Never Smoker   . Smokeless tobacco: None  . Alcohol Use: No     No Known Allergies  Current Outpatient Prescriptions  Medication Sig Dispense Refill  . aspirin EC 81 MG tablet Take 81 mg by mouth daily.    .Marland Kitchenatorvastatin (LIPITOR) 20 MG tablet Take 20  mg by mouth daily at 6 PM.     . diltiazem (CARDIZEM CD) 240 MG 24 hr capsule Take 240 mg by mouth daily.     Marland Kitchen ibrutinib (IMBRUVICA) 140 MG capsul Take 2 capsules (280 mg total) by mouth daily. 60 capsule 3  . levothyroxine (SYNTHROID, LEVOTHROID) 75 MCG tablet Take 75 mcg by mouth daily before breakfast.     . triamterene-hydrochlorothiazide (DYAZIDE) 37.5-25 MG capsule      No current  facility-administered medications for this visit.    OBJECTIVE:  Filed Vitals:   05/15/15 1035  BP: 154/69  Pulse: 89  Temp: 97.6 F (36.4 C)  Resp: 18     Body mass index is 26.96 kg/(m^2).    ECOG FS:1 - Symptomatic but completely ambulatory  PHYSICAL EXAM:  General status: Performance status is good.  Patient has not lost significant weight No chills or fever no significant change in patient's performance status HEENT: No evidence of stomatitis.  Plains of runny nose. Sclera and conjunctivae :: No jaundice.   pale looking . Lungs: Air  entry equal on both sides.  No rhonchi.  No rales.  Cardiac: Heart sounds are normal.  No pericardial rub.  No murmur. Lymphatic system: Cervical, axillary, inguinal, lymph nodes not palpable GI: Abdomen is soft.  No ascites.  Liver spleen not palpable.  No tenderness.  Bowel sounds are within normal limit Lower extremity: No edema Neurological system: Higher functions, cranial nerves intact No evidence of peripheral neuropathy. Skin: No rash.  No ecchymosis.Marland Kitchen    LAB RESULTS:  Appointment on 05/15/2015  Component Date Value Ref Range Status  . WBC 05/15/2015 10.2  3.6 - 11.0 K/uL Final  . RBC 05/15/2015 4.00  3.80 - 5.20 MIL/uL Final  . Hemoglobin 05/15/2015 11.9* 12.0 - 16.0 g/dL Final  . HCT 05/15/2015 35.7  35.0 - 47.0 % Final  . MCV 05/15/2015 89.4  80.0 - 100.0 fL Final  . MCH 05/15/2015 29.8  26.0 - 34.0 pg Final  . MCHC 05/15/2015 33.4  32.0 - 36.0 g/dL Final  . RDW 05/15/2015 16.8* 11.5 - 14.5 % Final  . Platelets 05/15/2015 130* 150 - 440 K/uL Final  . Neutrophils Relative % 05/15/2015 13   Final  . Neutro Abs 05/15/2015 1.3* 1.4 - 6.5 K/uL Final  . Lymphocytes Relative 05/15/2015 81   Final  . Lymphs Abs 05/15/2015 8.3* 1.0 - 3.6 K/uL Final  . Monocytes Relative 05/15/2015 4   Final  . Monocytes Absolute 05/15/2015 0.4  0.2 - 0.9 K/uL Final  . Eosinophils Relative 05/15/2015 1   Final  . Eosinophils Absolute 05/15/2015  0.1  0 - 0.7 K/uL Final  . Basophils Relative 05/15/2015 1   Final  . Basophils Absolute 05/15/2015 0.1  0 - 0.1 K/uL Final  . Sodium 05/15/2015 140  135 - 145 mmol/L Final  . Potassium 05/15/2015 3.5  3.5 - 5.1 mmol/L Final  . Chloride 05/15/2015 105  101 - 111 mmol/L Final  . CO2 05/15/2015 25  22 - 32 mmol/L Final  . Glucose, Bld 05/15/2015 97  65 - 99 mg/dL Final  . BUN 05/15/2015 13  6 - 20 mg/dL Final  . Creatinine, Ser 05/15/2015 0.84  0.44 - 1.00 mg/dL Final  . Calcium 05/15/2015 9.4  8.9 - 10.3 mg/dL Final  . Total Protein 05/15/2015 7.1  6.5 - 8.1 g/dL Final  . Albumin 05/15/2015 4.4  3.5 - 5.0 g/dL Final  . AST 05/15/2015 25  15 -  41 U/L Final  . ALT 05/15/2015 17  14 - 54 U/L Final  . Alkaline Phosphatase 05/15/2015 42  38 - 126 U/L Final  . Total Bilirubin 05/15/2015 1.0  0.3 - 1.2 mg/dL Final  . GFR calc non Af Amer 05/15/2015 >60  >60 mL/min Final  . GFR calc Af Amer 05/15/2015 >60  >60 mL/min Final   Comment: (NOTE) The eGFR has been calculated using the CKD EPI equation. This calculation has not been validated in all clinical situations. eGFR's persistently <60 mL/min signify possible Chronic Kidney Disease.   . Anion gap 05/15/2015 10  5 - 15 Final  . Magnesium 05/15/2015 1.8  1.7 - 2.4 mg/dL Final        ASSESSMENT: 82 year old lady with chronic lymphocytic leukemia now on Ibrutanib.   Rising WBC count and hemoglobin is dropping to 10 g patient is becoming symptomatic most likely due to anemia LDH has been high Patient has started Ibrutanib 2 tablets a day.  Tolerating well.  Seen any significant reduction in no blood count yet.  But no diarrhea.  No rash. No chills or fever   MEDICAL DECISION MAKING:  Chronic lymphocytic leukemia. Anemia has improved On Ibrutanib 2 tablets of 140 mg Tolerating well.  Patient is responding with decreasing white count and improvement of hemoglobin  Patient was reminded that if any surgical procedure is planned patient  has to come off improved on a few days before and few days after the procedure.  And patient is asked to contact us prior to surgical procedure Blood pressure is improved after patient was taken off Dyazide Continue with return he been the same dose patient is tolerating very well reevaluation in 4 weeks  All lab data has been reviewed white count is still high is 33,000.  Hemoglobin remained stable (low but stable) Patient expressed understanding and was in agreement with this plan. She also understands that She can call clinic at any time with any questions, concerns, or complaints.    No matching staging information was found for the patient.  Forest Gleason, MD   05/18/2015 6:17 PM

## 2015-06-13 ENCOUNTER — Inpatient Hospital Stay (HOSPITAL_BASED_OUTPATIENT_CLINIC_OR_DEPARTMENT_OTHER): Payer: Medicare Other | Admitting: Oncology

## 2015-06-13 ENCOUNTER — Inpatient Hospital Stay: Payer: Medicare Other | Attending: Oncology

## 2015-06-13 ENCOUNTER — Encounter: Payer: Self-pay | Admitting: Oncology

## 2015-06-13 VITALS — BP 171/73 | HR 80 | Temp 97.8°F | Resp 18 | Wt 147.4 lb

## 2015-06-13 DIAGNOSIS — E039 Hypothyroidism, unspecified: Secondary | ICD-10-CM

## 2015-06-13 DIAGNOSIS — E7800 Pure hypercholesterolemia, unspecified: Secondary | ICD-10-CM

## 2015-06-13 DIAGNOSIS — C9110 Chronic lymphocytic leukemia of B-cell type not having achieved remission: Secondary | ICD-10-CM

## 2015-06-13 DIAGNOSIS — R002 Palpitations: Secondary | ICD-10-CM

## 2015-06-13 DIAGNOSIS — R162 Hepatomegaly with splenomegaly, not elsewhere classified: Secondary | ICD-10-CM | POA: Insufficient documentation

## 2015-06-13 DIAGNOSIS — D649 Anemia, unspecified: Secondary | ICD-10-CM

## 2015-06-13 DIAGNOSIS — I10 Essential (primary) hypertension: Secondary | ICD-10-CM | POA: Diagnosis not present

## 2015-06-13 DIAGNOSIS — Z79899 Other long term (current) drug therapy: Secondary | ICD-10-CM

## 2015-06-13 DIAGNOSIS — Z9221 Personal history of antineoplastic chemotherapy: Secondary | ICD-10-CM | POA: Diagnosis not present

## 2015-06-13 DIAGNOSIS — Z7982 Long term (current) use of aspirin: Secondary | ICD-10-CM

## 2015-06-13 DIAGNOSIS — Z862 Personal history of diseases of the blood and blood-forming organs and certain disorders involving the immune mechanism: Secondary | ICD-10-CM | POA: Diagnosis not present

## 2015-06-13 DIAGNOSIS — H9190 Unspecified hearing loss, unspecified ear: Secondary | ICD-10-CM | POA: Diagnosis not present

## 2015-06-13 LAB — CBC WITH DIFFERENTIAL/PLATELET
Basophils Absolute: 0.1 10*3/uL (ref 0–0.1)
Basophils Relative: 1 %
EOS ABS: 0 10*3/uL (ref 0–0.7)
Eosinophils Relative: 1 %
HEMATOCRIT: 42.4 % (ref 35.0–47.0)
HEMOGLOBIN: 13.8 g/dL (ref 12.0–16.0)
LYMPHS ABS: 6.6 10*3/uL — AB (ref 1.0–3.6)
LYMPHS PCT: 75 %
MCH: 27.8 pg (ref 26.0–34.0)
MCHC: 32.6 g/dL (ref 32.0–36.0)
MCV: 85.3 fL (ref 80.0–100.0)
MONOS PCT: 5 %
Monocytes Absolute: 0.4 10*3/uL (ref 0.2–0.9)
NEUTROS ABS: 1.6 10*3/uL (ref 1.4–6.5)
NEUTROS PCT: 18 %
Platelets: 105 10*3/uL — ABNORMAL LOW (ref 150–440)
RBC: 4.97 MIL/uL (ref 3.80–5.20)
RDW: 16.3 % — ABNORMAL HIGH (ref 11.5–14.5)
WBC: 8.7 10*3/uL (ref 3.6–11.0)

## 2015-06-13 LAB — COMPREHENSIVE METABOLIC PANEL
ALK PHOS: 50 U/L (ref 38–126)
ALT: 18 U/L (ref 14–54)
ANION GAP: 8 (ref 5–15)
AST: 28 U/L (ref 15–41)
Albumin: 4.7 g/dL (ref 3.5–5.0)
BILIRUBIN TOTAL: 1 mg/dL (ref 0.3–1.2)
BUN: 18 mg/dL (ref 6–20)
CALCIUM: 9.4 mg/dL (ref 8.9–10.3)
CO2: 26 mmol/L (ref 22–32)
CREATININE: 0.93 mg/dL (ref 0.44–1.00)
Chloride: 102 mmol/L (ref 101–111)
GFR calc non Af Amer: 54 mL/min — ABNORMAL LOW (ref >60)
Glucose, Bld: 103 mg/dL — ABNORMAL HIGH (ref 65–99)
Potassium: 3.7 mmol/L (ref 3.5–5.1)
Sodium: 136 mmol/L (ref 135–145)
TOTAL PROTEIN: 7.5 g/dL (ref 6.5–8.1)

## 2015-06-13 MED ORDER — IBRUTINIB 140 MG PO CAPS: 280.0000 mg | ORAL_CAPSULE | Freq: Every day | ORAL | Status: DC

## 2015-06-13 NOTE — Progress Notes (Signed)
[place] @ [facility] Telephone:([phone]) [phone]  Fax:([phone]) [place]: 01-31-1928  MR#: [id number]  YKD#:[id number]  Patient Care Team: Albina Billet, MD as PCP - General (Internal Medicine)  CHIEF COMPLAINT:  Chief Complaint  Patient presents with  . CLL   Oncology History   1. Ultrasound revealed abdominal lymphadenopathy(March, 2013) 2. Biopsy as well as peripheral blood be suggestive of chronic lymphocytic leukemia (April, 2013) 3. Started on bendamustin and Rituxan from May of 2013 4. Has finished her 6 cycles of chemotherapy with Rituxan and bendamustine (October 26, 2011) 5.recurrent and progressive disease with progressive anemia.  Patient was started on IBRUTANIB (March, 2016)  taking 140 mg 2 pills a day   6.  Patient was taken off   IBRUTANIB in August of 2016 because of persistent myelosuppression even with reducing dose 7.  Because of rising WBC count and anemia patient would be started back on a lower dose of improved on   ibrutanib  140 mg 2 tablets a day  No flowsheet data found.  INTERVAL HISTORY: 82 year old lady with a history of chronic lymphocytic leukemia recently was started on Ibrutanib.  Patient is here for ongoing evaluation and treatment consideration. Patient is tolerating the reduced dose of Ibrutanib 2tabs/134m daily.  Hemoglobin is improved to 11.9 and white count has dropped to 10,000.  No diarrhea.  Occasional dizziness persist Here for further follow-up and treatment consideration No rash no diarrhea Patient is here for ongoing evaluation and treatment consideration  Patient is here for further follow-up regarding chronic lymphocytic leukemia.  No chills.  No fever.  REVIEW OF SYSTEMS:   GENERAL:  Feels good.  Active.  No fevers, sweats or weight loss. PERFORMANCE STATUS (ECOG): 01 HEENT:  No visual changes, runny nose, sore throat, mouth sores or tenderness. Lungs: No shortness of breath or cough.  No hemoptysis. Cardiac:  No  chest pain, palpitations, orthopnea, or PND. GI:  No nausea, vomiting, diarrhea, constipation, melena or hematochezia. GU:  No urgency, frequency, dysuria, or hematuria. Musculoskeletal:  No back pain.  No joint pain.  No muscle tenderness. Extremities:  No pain or swelling. Skin:  No rashes or skin changes. Neuro:  No headache, numbness or weakness, balance or coordination issues. Endocrine:  No diabetes, thyroid issues, hot flashes or night sweats. Psych:  No mood changes, depression or anxiety. Pain:  No focal pain. Review of systems:  All other systems reviewed and found to be negative.  PAST MEDICAL HISTORY: Past Medical History  Diagnosis Date  . [place] 06/06/2014  . Hypertension    Significant History/PMH:   Spleenomegaly:    Hepatomegaly:    Palpitations:    Hypertension:    Anemia:    Hypothyroidism:    Hard of Hearing:    htn:    high cholesterol:   Smoking History: Smoking History Never Smoked.(1)  PFSH: Comments: No family history of colorectal cancer, breast cancer, or ovarian cancer.  Comments: Does not smoke.  Does not drink.  Lives by herself  Additional Past Medical and Surgical History: Hypothyroidism.    Hyperlipidemia       ADVANCED DIRECTIVES: Patient does have advance care directive  HEALTH MAINTENANCE: Social History  Substance Use Topics  . Smoking status: Never Smoker   . Smokeless tobacco: None  . Alcohol Use: No     No Known Allergies  Current Outpatient Prescriptions  Medication Sig Dispense Refill  . aspirin EC 81 MG tablet Take  81 mg by mouth daily.    Marland Kitchen atorvastatin (LIPITOR) 20 MG tablet Take 20 mg by mouth daily at 6 PM.     . diltiazem (CARDIZEM CD) 240 MG 24 hr capsule Take 240 mg by mouth daily.     Marland Kitchen ibrutinib (IMBRUVICA) 140 MG capsul Take 2 capsules (280 mg total) by mouth daily. 60 capsule 3  . levothyroxine (SYNTHROID, LEVOTHROID) 75 MCG tablet Take 75 mcg by mouth daily before  breakfast.     . triamterene-hydrochlorothiazide (DYAZIDE) 37.5-25 MG capsule      No current facility-administered medications for this visit.    OBJECTIVE:  Filed Vitals:   06/13/15 1118  BP: 171/73  Pulse: 80  Temp: 97.8 F (36.6 C)  Resp: 18     Body mass index is 26.95 kg/(m^2).    ECOG FS:1 - Symptomatic but completely ambulatory  PHYSICAL EXAM:  General status: Performance status is good.  Patient has not lost significant weight No chills or fever no significant change in patient's performance status HEENT: No evidence of stomatitis.   Sclera and conjunctivae :: No jaundice.   pale looking . Lungs: Air  entry equal on both sides.  No rhonchi.  No rales.  Cardiac: Heart sounds are normal.  No pericardial rub.  No murmur. Lymphatic system: Cervical, axillary, inguinal, lymph nodes not palpable GI: Abdomen is soft.  No ascites.  Liver spleen not palpable.  No tenderness.  Bowel sounds are within normal limit Lower extremity: No edema Neurological system: Higher functions, cranial nerves intact No evidence of peripheral neuropathy. Skin: No rash.  No ecchymosis.Marland Kitchen    LAB RESULTS:  Appointment on 06/13/2015  Component Date Value Ref Range Status  . WBC 06/13/2015 8.7  3.6 - 11.0 K/uL Final  . RBC 06/13/2015 4.97  3.80 - 5.20 MIL/uL Final  . Hemoglobin 06/13/2015 13.8  12.0 - 16.0 g/dL Final  . HCT 06/13/2015 42.4  35.0 - 47.0 % Final  . MCV 06/13/2015 85.3  80.0 - 100.0 fL Final  . MCH 06/13/2015 27.8  26.0 - 34.0 pg Final  . MCHC 06/13/2015 32.6  32.0 - 36.0 g/dL Final  . RDW 06/13/2015 16.3* 11.5 - 14.5 % Final  . Platelets 06/13/2015 105* 150 - 440 K/uL Final  . Neutrophils Relative % 06/13/2015 18   Final  . Neutro Abs 06/13/2015 1.6  1.4 - 6.5 K/uL Final  . Lymphocytes Relative 06/13/2015 75   Final  . Lymphs Abs 06/13/2015 6.6* 1.0 - 3.6 K/uL Final  . Monocytes Relative 06/13/2015 5   Final  . Monocytes Absolute 06/13/2015 0.4  0.2 - 0.9 K/uL Final  .  Eosinophils Relative 06/13/2015 1   Final  . Eosinophils Absolute 06/13/2015 0.0  0 - 0.7 K/uL Final  . Basophils Relative 06/13/2015 1   Final  . Basophils Absolute 06/13/2015 0.1  0 - 0.1 K/uL Final  . Sodium 06/13/2015 136  135 - 145 mmol/L Final  . Potassium 06/13/2015 3.7  3.5 - 5.1 mmol/L Final  . Chloride 06/13/2015 102  101 - 111 mmol/L Final  . CO2 06/13/2015 26  22 - 32 mmol/L Final  . Glucose, Bld 06/13/2015 103* 65 - 99 mg/dL Final  . BUN 06/13/2015 18  6 - 20 mg/dL Final  . Creatinine, Ser 06/13/2015 0.93  0.44 - 1.00 mg/dL Final  . Calcium 06/13/2015 9.4  8.9 - 10.3 mg/dL Final  . Total Protein 06/13/2015 7.5  6.5 - 8.1 g/dL Final  . Albumin 06/13/2015 4.7  3.5 - 5.0 g/dL Final  . AST 06/13/2015 28  15 - 41 U/L Final  . ALT 06/13/2015 18  14 - 54 U/L Final  . Alkaline Phosphatase 06/13/2015 50  38 - 126 U/L Final  . Total Bilirubin 06/13/2015 1.0  0.3 - 1.2 mg/dL Final  . GFR calc non Af Amer 06/13/2015 54* >60 mL/min Final  . GFR calc Af Amer 06/13/2015 >60  >60 mL/min Final   Comment: (NOTE) The eGFR has been calculated using the CKD EPI equation. This calculation has not been validated in all clinical situations. eGFR's persistently <60 mL/min signify possible Chronic Kidney Disease.   . Anion gap 06/13/2015 8  5 - 15 Final        ASSESSMENT: 82 year old lady with chronic lymphocytic leukemia now on Ibrutanib.   Rising WBC count and hemoglobin is dropping to 10 g patient is becoming symptomatic most likely due to anemia LDH has been high Patient has started Ibrutanib 2 tablets a day.  Tolerating well.   Libby C count is improving.  Hemoglobin is improved.   MEDICAL DECISION MAKING:  Chronic lymphocytic leukemia. Anemia has improved On Ibrutanib 2 tablets of 140 mg Continue present approach.  Patient is tolerating the treatment very well without any significant diarrhea or myelosuppression  Patient was reminded that if any surgical procedure is planned  patient has to come off improved on a few days before and few days after the procedure.  And patient is asked to contact us prior to surgical procedure Blood pressure is improved after patient was taken off Dyazide Continue with return he been the same dose patient is tolerating very well reevaluation in 4 weeks  All lab data has been reviewed white count is still high is 33,000.  Hemoglobin remained stable (low but stable) Patient expressed understanding and was in agreement with this plan. She also understands that She can call clinic at any time with any questions, concerns, or complaints.    No matching staging information was found for the patient.  Forest Gleason, MD   06/13/2015 11:43 AM

## 2015-07-09 ENCOUNTER — Telehealth: Payer: Self-pay | Admitting: *Deleted

## 2015-07-09 ENCOUNTER — Other Ambulatory Visit: Payer: Self-pay | Admitting: *Deleted

## 2015-07-09 DIAGNOSIS — C9110 Chronic lymphocytic leukemia of B-cell type not having achieved remission: Secondary | ICD-10-CM

## 2015-07-09 NOTE — Telephone Encounter (Signed)
Called to report that she was to have prescription sent to pharmacy for her [facility], but she has not received it yet. I see the prescription was sent  To [facility] on 5/25 with 3 refills. I called [facility]  To check the status and was told that they have been unable to reach her since 5/26 and asked that we have her to call them. I called her back and gave her the number and asked her to call them and she repeated the number back to me

## 2015-07-11 ENCOUNTER — Encounter: Payer: Self-pay | Admitting: Internal Medicine

## 2015-07-11 ENCOUNTER — Inpatient Hospital Stay: Payer: Medicare Other | Attending: Internal Medicine

## 2015-07-11 ENCOUNTER — Ambulatory Visit: Payer: Medicare Other | Admitting: Internal Medicine

## 2015-07-11 ENCOUNTER — Inpatient Hospital Stay (HOSPITAL_BASED_OUTPATIENT_CLINIC_OR_DEPARTMENT_OTHER): Payer: Medicare Other | Admitting: Internal Medicine

## 2015-07-11 VITALS — BP 179/67 | HR 71 | Temp 98.0°F | Resp 18 | Wt 151.2 lb

## 2015-07-11 DIAGNOSIS — Z79899 Other long term (current) drug therapy: Secondary | ICD-10-CM | POA: Diagnosis not present

## 2015-07-11 DIAGNOSIS — D649 Anemia, unspecified: Secondary | ICD-10-CM | POA: Diagnosis not present

## 2015-07-11 DIAGNOSIS — Z7982 Long term (current) use of aspirin: Secondary | ICD-10-CM | POA: Diagnosis not present

## 2015-07-11 DIAGNOSIS — C9110 Chronic lymphocytic leukemia of B-cell type not having achieved remission: Secondary | ICD-10-CM

## 2015-07-11 LAB — COMPREHENSIVE METABOLIC PANEL
ALBUMIN: 4.5 g/dL (ref 3.5–5.0)
ALT: 20 U/L (ref 14–54)
AST: 29 U/L (ref 15–41)
Alkaline Phosphatase: 56 U/L (ref 38–126)
Anion gap: 8 (ref 5–15)
BILIRUBIN TOTAL: 1.1 mg/dL (ref 0.3–1.2)
BUN: 14 mg/dL (ref 6–20)
CO2: 27 mmol/L (ref 22–32)
Calcium: 9 mg/dL (ref 8.9–10.3)
Chloride: 101 mmol/L (ref 101–111)
Creatinine, Ser: 0.83 mg/dL (ref 0.44–1.00)
GFR calc Af Amer: >60 mL/min (ref >60)
GFR calc non Af Amer: >60 mL/min (ref >60)
GLUCOSE: 106 mg/dL — AB (ref 65–99)
Potassium: 3.5 mmol/L (ref 3.5–5.1)
SODIUM: 136 mmol/L (ref 135–145)
TOTAL PROTEIN: 7.3 g/dL (ref 6.5–8.1)

## 2015-07-11 LAB — CBC WITH DIFFERENTIAL/PLATELET
BASOS PCT: 2 %
Basophils Absolute: 0.1 10*3/uL (ref 0–0.1)
EOS PCT: 1 %
Eosinophils Absolute: 0 10*3/uL (ref 0–0.7)
HCT: 42.1 % (ref 35.0–47.0)
Hemoglobin: 14 g/dL (ref 12.0–16.0)
Lymphocytes Relative: 71 %
Lymphs Abs: 3.8 10*3/uL — ABNORMAL HIGH (ref 1.0–3.6)
MCH: 27.8 pg (ref 26.0–34.0)
MCHC: 33.2 g/dL (ref 32.0–36.0)
MCV: 83.6 fL (ref 80.0–100.0)
MONO ABS: 0.3 10*3/uL (ref 0.2–0.9)
MONOS PCT: 6 %
NEUTROS ABS: 1.1 10*3/uL — AB (ref 1.4–6.5)
Neutrophils Relative %: 20 %
PLATELETS: 97 10*3/uL — AB (ref 150–440)
RBC: 5.04 MIL/uL (ref 3.80–5.20)
RDW: 15.9 % — ABNORMAL HIGH (ref 11.5–14.5)
WBC: 5.3 10*3/uL (ref 3.6–11.0)

## 2015-07-11 MED ORDER — IBRUTINIB 140 MG PO CAPS: 280.0000 mg | ORAL_CAPSULE | Freq: Every day | ORAL | Status: DC

## 2015-07-11 NOTE — Progress Notes (Signed)
[place] OFFICE PROGRESS NOTE  Patient Care Team: Albina Billet, MD as PCP - General (Internal Medicine)  No matching staging information was found for the patient.   Oncology History   1. Ultrasound revealed abdominal lymphadenopathy(March, 2013) 2. Biopsy as well as peripheral blood be suggestive of chronic lymphocytic leukemia (April, 2013) 3. Started on bendamustin and Rituxan from May of 2013 4. Has finished her 6 cycles of chemotherapy with Rituxan and bendamustine (October 26, 2011) 5.recurrent and progressive disease with progressive anemia.  Patient was started on IBRUTANIB (March, 2016)  taking 140 mg 3 pills a day  6. Patient was taken off IBRUTANIB in August of 2016 because of persistent myelosuppression even with reducing dose 7. Because of rising WBC count and anemia patient would be started back on a lower dose of improved on ibrutanib 140 mg 2 tablets a day     Chronic lymphocytic leukemia ([place])   06/06/2014 Initial Diagnosis Chronic lymphocytic leukemia     INTERVAL HISTORY:  Heather [name] 82 y.o.  female pleasant patient above history of CLL currently on ibrutinib since March 2016 is here for follow-up.  Patient denies any chest pain denies any shortness of breath denies any palpitations. Denies any diarrhea. No nausea no vomiting.  Denies any weight loss or night sweats.    REVIEW OF SYSTEMS:  A complete 10 point review of system is done which is negative except mentioned above/history of present illness.   PAST MEDICAL HISTORY :  Past Medical History  Diagnosis Date  . Chronic lymphocytic leukemia ([place]) 06/06/2014  . Hypertension   . Hypothyroidism   . Hyperlipidemia   . Anemia   . Palpitations   . Hearing loss   . Risk for falls   . Cataracts, bilateral   . Thrombocytopenia ([place])   . History of chemotherapy     PAST SURGICAL HISTORY :   Past Surgical History  Procedure Laterality Date  . Excision left cervical node biopsy   05/04/2011  . Cataract extraction Left 03/29/2012    FAMILY HISTORY :  No family history on file.  SOCIAL HISTORY:   Social History  Substance Use Topics  . Smoking status: Never Smoker   . Smokeless tobacco: Never Used  . Alcohol Use: No    ALLERGIES:  has No Known Allergies.  MEDICATIONS:  Current Outpatient Prescriptions  Medication Sig Dispense Refill  . aspirin EC 81 MG tablet Take 81 mg by mouth daily.    Marland Kitchen atorvastatin (LIPITOR) 20 MG tablet Take 20 mg by mouth daily at 6 PM.     . diltiazem (CARDIZEM CD) 240 MG 24 hr capsule Take 240 mg by mouth daily.     Marland Kitchen ibrutinib (IMBRUVICA) 140 MG capsul Take 2 capsules (280 mg total) by mouth daily. 60 capsule 3  . levothyroxine (SYNTHROID, LEVOTHROID) 75 MCG tablet Take 75 mcg by mouth daily before breakfast.      No current facility-administered medications for this visit.    PHYSICAL EXAMINATION: ECOG PERFORMANCE STATUS: 1 - Symptomatic but completely ambulatory  BP 179/67 mmHg  Pulse 71  Temp(Src) 98 F (36.7 C) (Tympanic)  Resp 18  Wt 151 lb 3.8 oz (68.6 kg)  Filed Weights   07/11/15 1049  Weight: 151 lb 3.8 oz (68.6 kg)    GENERAL: Well-nourished well-developed; Alert, no distress and comfortable.  Walks with a rolling walker. She is accompanied by her sister. EYES: no pallor or icterus OROPHARYNX: no thrush or ulceration; good  dentition  NECK: supple, no masses felt LYMPH:  no palpable lymphadenopathy in the cervical, axillary or inguinal regions LUNGS: clear to auscultation and  No wheeze or crackles HEART/CVS: regular rate & rhythm and no murmurs; No lower extremity edema ABDOMEN:abdomen soft, non-tender and normal bowel sounds Musculoskeletal:no cyanosis of digits and no clubbing  PSYCH: alert & oriented x 3 with fluent speech NEURO: no focal motor/sensory deficits SKIN:  no rashes or significant lesions  LABORATORY DATA:  I have reviewed the data as listed    Component Value Date/Time   NA 136  07/11/2015 1000   NA 133* 05/08/2014 1349   K 3.5 07/11/2015 1000   K 3.6 05/08/2014 1349   CL 101 07/11/2015 1000   CL 102 05/08/2014 1349   CO2 27 07/11/2015 1000   CO2 26 05/08/2014 1349   GLUCOSE 106* 07/11/2015 1000   GLUCOSE 82 05/08/2014 1349   BUN 14 07/11/2015 1000   BUN 18 05/08/2014 1349   CREATININE 0.83 07/11/2015 1000   CREATININE 1.13* 05/08/2014 1349   CALCIUM 9.0 07/11/2015 1000   CALCIUM 9.1 05/08/2014 1349   PROT 7.3 07/11/2015 1000   PROT 6.7 05/08/2014 1349   ALBUMIN 4.5 07/11/2015 1000   ALBUMIN 4.2 05/08/2014 1349   AST 29 07/11/2015 1000   AST 24 05/08/2014 1349   ALT 20 07/11/2015 1000   ALT 13* 05/08/2014 1349   ALKPHOS 56 07/11/2015 1000   ALKPHOS 41 05/08/2014 1349   BILITOT 1.1 07/11/2015 1000   BILITOT 1.3* 05/08/2014 1349   GFRNONAA >60 07/11/2015 1000   GFRNONAA 45* 05/08/2014 1349   GFRNONAA 44* 12/07/2013 0936   GFRAA >60 07/11/2015 1000   GFRAA 52* 05/08/2014 1349   GFRAA 53* 12/07/2013 0936    No results found for: SPEP, UPEP  Lab Results  Component Value Date   WBC 5.3 07/11/2015   NEUTROABS 1.1* 07/11/2015   HGB 14.0 07/11/2015   HCT 42.1 07/11/2015   MCV 83.6 07/11/2015   PLT 97* 07/11/2015      Chemistry      Component Value Date/Time   NA 136 07/11/2015 1000   NA 133* 05/08/2014 1349   K 3.5 07/11/2015 1000   K 3.6 05/08/2014 1349   CL 101 07/11/2015 1000   CL 102 05/08/2014 1349   CO2 27 07/11/2015 1000   CO2 26 05/08/2014 1349   BUN 14 07/11/2015 1000   BUN 18 05/08/2014 1349   CREATININE 0.83 07/11/2015 1000   CREATININE 1.13* 05/08/2014 1349      Component Value Date/Time   CALCIUM 9.0 07/11/2015 1000   CALCIUM 9.1 05/08/2014 1349   ALKPHOS 56 07/11/2015 1000   ALKPHOS 41 05/08/2014 1349   AST 29 07/11/2015 1000   AST 24 05/08/2014 1349   ALT 20 07/11/2015 1000   ALT 13* 05/08/2014 1349   BILITOT 1.1 07/11/2015 1000   BILITOT 1.3* 05/08/2014 1349       RADIOGRAPHIC STUDIES: I have personally  reviewed the radiological images as listed and agreed with the findings in the report. No results found.   ASSESSMENT & PLAN:  Chronic lymphocytic leukemia CLL on imbruvica- since March 2016 currently on 2 pills secondary to intolerance. Tolerating the current dose very well White count hemoglobin normal platelets slightly low at 90. Continue ibrutinib at this time.  # Follow-up in 4 weeks with CBC CMP.     Orders Placed This Encounter  Procedures  . Comprehensive metabolic panel    Standing Status: Future  Number of Occurrences:      Standing Expiration Date: 07/10/2016    Order Specific Question:  Has the patient fasted?    Answer:  No  . CBC with Differential    Standing Status: Future     Number of Occurrences:      Standing Expiration Date: 07/10/2016  . Lactate dehydrogenase    Standing Status: Future     Number of Occurrences:      Standing Expiration Date: 07/10/2016   All questions were answered. The patient knows to call the clinic with any problems, questions or concerns.      Cammie Sickle, MD 07/11/2015 5:27 PM

## 2015-07-11 NOTE — Assessment & Plan Note (Addendum)
CLL on imbruvica- since March 2016 currently on 2 pills secondary to intolerance. Tolerating the current dose very well White count hemoglobin normal platelets slightly low at 90. Continue ibrutinib at this time.  # Follow-up in 4 weeks with CBC CMP.

## 2015-07-11 NOTE — Progress Notes (Signed)
Patient states she just filled the last refill for Imbruvica.  Will need refill from [facility].

## 2015-07-12 LAB — THYROID PANEL WITH TSH
FREE THYROXINE INDEX: 2.8 (ref 1.2–4.9)
T3 Uptake Ratio: 25 % (ref 24–39)
T4, Total: 11.1 ug/dL (ref 4.5–12.0)
TSH: 7.32 u[IU]/mL — ABNORMAL HIGH (ref 0.450–4.500)

## 2015-08-09 ENCOUNTER — Other Ambulatory Visit: Payer: Medicare Other

## 2015-08-09 ENCOUNTER — Ambulatory Visit: Payer: Medicare Other | Admitting: Internal Medicine

## 2015-08-21 ENCOUNTER — Inpatient Hospital Stay: Payer: Medicare Other

## 2015-08-21 ENCOUNTER — Inpatient Hospital Stay: Payer: Medicare Other | Attending: Internal Medicine | Admitting: Internal Medicine

## 2015-08-21 ENCOUNTER — Other Ambulatory Visit: Payer: Self-pay

## 2015-08-21 ENCOUNTER — Other Ambulatory Visit: Payer: Self-pay | Admitting: *Deleted

## 2015-08-21 VITALS — BP 179/76 | HR 77 | Temp 98.0°F | Resp 18 | Wt 154.2 lb

## 2015-08-21 DIAGNOSIS — Z7982 Long term (current) use of aspirin: Secondary | ICD-10-CM | POA: Insufficient documentation

## 2015-08-21 DIAGNOSIS — R002 Palpitations: Secondary | ICD-10-CM | POA: Insufficient documentation

## 2015-08-21 DIAGNOSIS — D649 Anemia, unspecified: Secondary | ICD-10-CM | POA: Diagnosis not present

## 2015-08-21 DIAGNOSIS — E785 Hyperlipidemia, unspecified: Secondary | ICD-10-CM | POA: Insufficient documentation

## 2015-08-21 DIAGNOSIS — M7989 Other specified soft tissue disorders: Secondary | ICD-10-CM | POA: Diagnosis not present

## 2015-08-21 DIAGNOSIS — D696 Thrombocytopenia, unspecified: Secondary | ICD-10-CM | POA: Diagnosis not present

## 2015-08-21 DIAGNOSIS — R42 Dizziness and giddiness: Secondary | ICD-10-CM | POA: Insufficient documentation

## 2015-08-21 DIAGNOSIS — E039 Hypothyroidism, unspecified: Secondary | ICD-10-CM | POA: Diagnosis not present

## 2015-08-21 DIAGNOSIS — C9110 Chronic lymphocytic leukemia of B-cell type not having achieved remission: Secondary | ICD-10-CM

## 2015-08-21 DIAGNOSIS — E876 Hypokalemia: Secondary | ICD-10-CM

## 2015-08-21 DIAGNOSIS — I10 Essential (primary) hypertension: Secondary | ICD-10-CM | POA: Diagnosis not present

## 2015-08-21 DIAGNOSIS — Z79899 Other long term (current) drug therapy: Secondary | ICD-10-CM | POA: Insufficient documentation

## 2015-08-21 LAB — CBC WITH DIFFERENTIAL/PLATELET
BASOS ABS: 0.1 10*3/uL (ref 0–0.1)
Eosinophils Absolute: 0 10*3/uL (ref 0–0.7)
Eosinophils Relative: 1 %
HEMATOCRIT: 43.3 % (ref 35.0–47.0)
HEMOGLOBIN: 14.8 g/dL (ref 12.0–16.0)
Lymphocytes Relative: 64 %
Lymphs Abs: 3.9 10*3/uL — ABNORMAL HIGH (ref 1.0–3.6)
MCH: 28.2 pg (ref 26.0–34.0)
MCHC: 34.2 g/dL (ref 32.0–36.0)
MCV: 82.4 fL (ref 80.0–100.0)
Monocytes Absolute: 0.4 10*3/uL (ref 0.2–0.9)
NEUTROS ABS: 1.6 10*3/uL (ref 1.4–6.5)
Platelets: 96 10*3/uL — ABNORMAL LOW (ref 150–440)
RBC: 5.25 MIL/uL — ABNORMAL HIGH (ref 3.80–5.20)
RDW: 15.2 % — ABNORMAL HIGH (ref 11.5–14.5)
WBC: 6 10*3/uL (ref 3.6–11.0)

## 2015-08-21 LAB — COMPREHENSIVE METABOLIC PANEL
ALK PHOS: 52 U/L (ref 38–126)
ALT: 18 U/L (ref 14–54)
ANION GAP: 8 (ref 5–15)
AST: 27 U/L (ref 15–41)
Albumin: 4.7 g/dL (ref 3.5–5.0)
BUN: 13 mg/dL (ref 6–20)
CALCIUM: 9.1 mg/dL (ref 8.9–10.3)
CO2: 25 mmol/L (ref 22–32)
Chloride: 103 mmol/L (ref 101–111)
Creatinine, Ser: 0.78 mg/dL (ref 0.44–1.00)
GFR calc non Af Amer: >60 mL/min (ref >60)
Glucose, Bld: 107 mg/dL — ABNORMAL HIGH (ref 65–99)
Potassium: 3.3 mmol/L — ABNORMAL LOW (ref 3.5–5.1)
SODIUM: 136 mmol/L (ref 135–145)
Total Bilirubin: 1.1 mg/dL (ref 0.3–1.2)
Total Protein: 7.4 g/dL (ref 6.5–8.1)

## 2015-08-21 LAB — LACTATE DEHYDROGENASE: LDH: 294 U/L — ABNORMAL HIGH (ref 98–192)

## 2015-08-21 NOTE — Progress Notes (Signed)
[place] OFFICE PROGRESS NOTE  Patient Care Team: Albina Billet, MD as PCP - General (Internal Medicine)  No matching staging information was found for the patient.   Oncology History   1. Ultrasound revealed abdominal lymphadenopathy(March, 2013) 2. Biopsy as well as peripheral blood be suggestive of chronic lymphocytic leukemia (April, 2013) 3. Started on bendamustin and Rituxan from May of 2013 4. Has finished her 6 cycles of chemotherapy with Rituxan and bendamustine (October 26, 2011) 5.recurrent and progressive disease with progressive anemia.  Patient was started on IBRUTANIB (March, 2016)  taking 140 mg 3 pills a day  6. Patient was taken off IBRUTANIB in August of 2016 because of persistent myelosuppression even with reducing dose 7. Because of rising WBC count and anemia patient would be started back on a lower dose of improved on ibrutanib 140 mg 2 tablets a day     Chronic lymphocytic leukemia ([place])   06/06/2014 Initial Diagnosis    Chronic lymphocytic leukemia       INTERVAL HISTORY:  Heather [name] 82 y.o.  female pleasant patient above history of CLL currently on ibrutinib since March 2016 is here for follow-up.  She noted to have mild swelling in the bilateral ankles the last many months. She had been taken off Lasix per Dr. Oliva Bustard in the past. She had been started on pravastatin by PCP.  She also complains of mild dizziness especially with moving of her head. Denies any syncopal episode or falls.  Patient denies any chest pain denies any shortness of breath denies any palpitations. Denies any diarrhea. No nausea no vomiting.  Denies any weight loss or night sweats.    REVIEW OF SYSTEMS:  A complete 10 point review of system is done which is negative except mentioned above/history of present illness.   PAST MEDICAL HISTORY :  Past Medical History:  Diagnosis Date  . Anemia   . Cataracts, bilateral   . Chronic lymphocytic leukemia ([place])  06/06/2014  . Hearing loss   . History of chemotherapy   . Hyperlipidemia   . Hypertension   . Hypothyroidism   . Palpitations   . Risk for falls   . Thrombocytopenia ([place])     PAST SURGICAL HISTORY :   Past Surgical History:  Procedure Laterality Date  . CATARACT EXTRACTION Left 03/29/2012  . Excision left cervical node biopsy  05/04/2011    FAMILY HISTORY :  No family history on file.  SOCIAL HISTORY:   Social History  Substance Use Topics  . Smoking status: Never Smoker  . Smokeless tobacco: Never Used  . Alcohol use No    ALLERGIES:  has No Known Allergies.  MEDICATIONS:  Current Outpatient Prescriptions  Medication Sig Dispense Refill  . aspirin EC 81 MG tablet Take 81 mg by mouth daily.    Marland Kitchen diltiazem (CARDIZEM CD) 240 MG 24 hr capsule Take 240 mg by mouth daily.     Marland Kitchen ibrutinib (IMBRUVICA) 140 MG capsul Take 2 capsules (280 mg total) by mouth daily. 60 capsule 3  . levothyroxine (SYNTHROID, LEVOTHROID) 75 MCG tablet Take 75 mcg by mouth daily before breakfast.     . pravastatin (PRAVACHOL) 20 MG tablet      No current facility-administered medications for this visit.     PHYSICAL EXAMINATION: ECOG PERFORMANCE STATUS: 1 - Symptomatic but completely ambulatory  BP (!) 179/76 (BP Location: Right Arm, Patient Position: Sitting)   Pulse 77   Temp 98 F (36.7 C) (Tympanic)  Resp 18   Wt 154 lb 4 oz (70 kg)   BMI 28.21 kg/m   Filed Weights   08/21/15 1156  Weight: 154 lb 4 oz (70 kg)    GENERAL: Well-nourished well-developed; Alert, no distress and comfortable.  Walks with a rolling walker. She is accompanied by her sister. EYES: no pallor or icterus OROPHARYNX: no thrush or ulceration; good dentition  NECK: supple, no masses felt LYMPH:  no palpable lymphadenopathy in the cervical, axillary or inguinal regions LUNGS: clear to auscultation and  No wheeze or crackles HEART/CVS: regular rate & rhythm and no murmurs; No lower extremity  edema ABDOMEN:abdomen soft, non-tender and normal bowel sounds Musculoskeletal:no cyanosis of digits and no clubbing  PSYCH: alert & oriented x 3 with fluent speech NEURO: no focal motor/sensory deficits SKIN:  no rashes or significant lesions  LABORATORY DATA:  I have reviewed the data as listed    Component Value Date/Time   NA 136 08/21/2015 1115   NA 133 (L) 05/08/2014 1349   K 3.3 (L) 08/21/2015 1115   K 3.6 05/08/2014 1349   CL 103 08/21/2015 1115   CL 102 05/08/2014 1349   CO2 25 08/21/2015 1115   CO2 26 05/08/2014 1349   GLUCOSE 107 (H) 08/21/2015 1115   GLUCOSE 82 05/08/2014 1349   BUN 13 08/21/2015 1115   BUN 18 05/08/2014 1349   CREATININE 0.78 08/21/2015 1115   CREATININE 1.13 (H) 05/08/2014 1349   CALCIUM 9.1 08/21/2015 1115   CALCIUM 9.1 05/08/2014 1349   PROT 7.4 08/21/2015 1115   PROT 6.7 05/08/2014 1349   ALBUMIN 4.7 08/21/2015 1115   ALBUMIN 4.2 05/08/2014 1349   AST 27 08/21/2015 1115   AST 24 05/08/2014 1349   ALT 18 08/21/2015 1115   ALT 13 (L) 05/08/2014 1349   ALKPHOS 52 08/21/2015 1115   ALKPHOS 41 05/08/2014 1349   BILITOT 1.1 08/21/2015 1115   BILITOT 1.3 (H) 05/08/2014 1349   GFRNONAA >60 08/21/2015 1115   GFRNONAA 45 (L) 05/08/2014 1349   GFRAA >60 08/21/2015 1115   GFRAA 52 (L) 05/08/2014 1349    No results found for: SPEP, UPEP  Lab Results  Component Value Date   WBC 6.0 08/21/2015   NEUTROABS 1.6 08/21/2015   HGB 14.8 08/21/2015   HCT 43.3 08/21/2015   MCV 82.4 08/21/2015   PLT 96 (L) 08/21/2015      Chemistry      Component Value Date/Time   NA 136 08/21/2015 1115   NA 133 (L) 05/08/2014 1349   K 3.3 (L) 08/21/2015 1115   K 3.6 05/08/2014 1349   CL 103 08/21/2015 1115   CL 102 05/08/2014 1349   CO2 25 08/21/2015 1115   CO2 26 05/08/2014 1349   BUN 13 08/21/2015 1115   BUN 18 05/08/2014 1349   CREATININE 0.78 08/21/2015 1115   CREATININE 1.13 (H) 05/08/2014 1349      Component Value Date/Time   CALCIUM 9.1  08/21/2015 1115   CALCIUM 9.1 05/08/2014 1349   ALKPHOS 52 08/21/2015 1115   ALKPHOS 41 05/08/2014 1349   AST 27 08/21/2015 1115   AST 24 05/08/2014 1349   ALT 18 08/21/2015 1115   ALT 13 (L) 05/08/2014 1349   BILITOT 1.1 08/21/2015 1115   BILITOT 1.3 (H) 05/08/2014 1349       RADIOGRAPHIC STUDIES: I have personally reviewed the radiological images as listed and agreed with the findings in the report. No results found.   ASSESSMENT &  PLAN:  Chronic lymphocytic leukemia CLL on imbruvica- since March 2016 currently on 2 pills secondary to intolerance. Tolerating the current dose very well White count hemoglobin normal platelets slightly low at 96. Continue ibrutinib at this time.  # Hypokalemia potassium 3.3. Dietary supplementation.  # dizziness- ? Vertigo- recommend if worse meclizine.   # Bil ankle swelling- ? Dependant edema- recommend stocking/leg elevation. ? Cholesterol medication- pravastatin. Defer to PCP.   # Follow-up in 4 weeks with CBC CMP/LDH   No orders of the defined types were placed in this encounter.  All questions were answered. The patient knows to call the clinic with any problems, questions or concerns.      Cammie Sickle, MD 08/21/2015 12:57 PM

## 2015-08-21 NOTE — Assessment & Plan Note (Addendum)
CLL on imbruvica- since March 2016 currently on 2 pills secondary to intolerance. Tolerating the current dose very well White count hemoglobin normal platelets slightly low at 96. Continue ibrutinib at this time.  # Hypokalemia potassium 3.3. Dietary supplementation.  # dizziness- ? Vertigo- recommend if worse meclizine.   # Bil ankle swelling- ? Dependant edema- recommend stocking/leg elevation. ? Cholesterol medication- pravastatin. Defer to PCP.   # Follow-up in 4 weeks with CBC CMP/LDH

## 2015-09-20 ENCOUNTER — Inpatient Hospital Stay: Payer: Medicare Other | Attending: Internal Medicine

## 2015-09-20 ENCOUNTER — Inpatient Hospital Stay (HOSPITAL_BASED_OUTPATIENT_CLINIC_OR_DEPARTMENT_OTHER): Payer: Medicare Other | Admitting: Internal Medicine

## 2015-09-20 VITALS — BP 148/74 | HR 74 | Temp 98.6°F | Resp 18 | Wt 154.0 lb

## 2015-09-20 DIAGNOSIS — I10 Essential (primary) hypertension: Secondary | ICD-10-CM | POA: Insufficient documentation

## 2015-09-20 DIAGNOSIS — E785 Hyperlipidemia, unspecified: Secondary | ICD-10-CM

## 2015-09-20 DIAGNOSIS — E039 Hypothyroidism, unspecified: Secondary | ICD-10-CM | POA: Diagnosis not present

## 2015-09-20 DIAGNOSIS — C9110 Chronic lymphocytic leukemia of B-cell type not having achieved remission: Secondary | ICD-10-CM | POA: Insufficient documentation

## 2015-09-20 DIAGNOSIS — R233 Spontaneous ecchymoses: Secondary | ICD-10-CM

## 2015-09-20 DIAGNOSIS — Z7982 Long term (current) use of aspirin: Secondary | ICD-10-CM | POA: Diagnosis not present

## 2015-09-20 DIAGNOSIS — Z79899 Other long term (current) drug therapy: Secondary | ICD-10-CM | POA: Diagnosis not present

## 2015-09-20 DIAGNOSIS — D696 Thrombocytopenia, unspecified: Secondary | ICD-10-CM | POA: Diagnosis not present

## 2015-09-20 DIAGNOSIS — Z9221 Personal history of antineoplastic chemotherapy: Secondary | ICD-10-CM

## 2015-09-20 DIAGNOSIS — M7989 Other specified soft tissue disorders: Secondary | ICD-10-CM | POA: Insufficient documentation

## 2015-09-20 DIAGNOSIS — D649 Anemia, unspecified: Secondary | ICD-10-CM | POA: Insufficient documentation

## 2015-09-20 DIAGNOSIS — R002 Palpitations: Secondary | ICD-10-CM

## 2015-09-20 DIAGNOSIS — R42 Dizziness and giddiness: Secondary | ICD-10-CM

## 2015-09-20 LAB — COMPREHENSIVE METABOLIC PANEL
ALT: 15 U/L (ref 14–54)
ANION GAP: 8 (ref 5–15)
AST: 25 U/L (ref 15–41)
Albumin: 4.8 g/dL (ref 3.5–5.0)
Alkaline Phosphatase: 52 U/L (ref 38–126)
BUN: 14 mg/dL (ref 6–20)
CALCIUM: 9.3 mg/dL (ref 8.9–10.3)
CHLORIDE: 104 mmol/L (ref 101–111)
CO2: 24 mmol/L (ref 22–32)
Creatinine, Ser: 0.83 mg/dL (ref 0.44–1.00)
GFR calc non Af Amer: >60 mL/min (ref >60)
Glucose, Bld: 105 mg/dL — ABNORMAL HIGH (ref 65–99)
POTASSIUM: 3.4 mmol/L — AB (ref 3.5–5.1)
SODIUM: 136 mmol/L (ref 135–145)
Total Bilirubin: 1.2 mg/dL (ref 0.3–1.2)
Total Protein: 7.3 g/dL (ref 6.5–8.1)

## 2015-09-20 LAB — CBC WITH DIFFERENTIAL/PLATELET
Basophils Absolute: 0.1 10*3/uL (ref 0–0.1)
Basophils Relative: 2 %
EOS ABS: 0 10*3/uL (ref 0–0.7)
HCT: 43.6 % (ref 35.0–47.0)
Hemoglobin: 15.2 g/dL (ref 12.0–16.0)
LYMPHS ABS: 2.7 10*3/uL (ref 1.0–3.6)
Lymphocytes Relative: 58 %
MCH: 29.2 pg (ref 26.0–34.0)
MCHC: 34.9 g/dL (ref 32.0–36.0)
MCV: 83.6 fL (ref 80.0–100.0)
Monocytes Absolute: 0.4 10*3/uL (ref 0.2–0.9)
Neutro Abs: 1.4 10*3/uL (ref 1.4–6.5)
Neutrophils Relative %: 31 %
PLATELETS: 89 10*3/uL — AB (ref 150–440)
RBC: 5.22 MIL/uL — ABNORMAL HIGH (ref 3.80–5.20)
RDW: 14.8 % — AB (ref 11.5–14.5)
WBC: 4.6 10*3/uL (ref 3.6–11.0)

## 2015-09-20 MED ORDER — MECLIZINE HCL 12.5 MG PO TABS: 12.5000 mg | ORAL_TABLET | Freq: Every evening | ORAL | 3 refills | Status: DC | PRN

## 2015-09-20 NOTE — Progress Notes (Signed)
[place] OFFICE PROGRESS NOTE  Patient Care Team: Albina Billet, MD as PCP - General (Internal Medicine)  No matching staging information was found for the patient.   Oncology History   1. Ultrasound revealed abdominal lymphadenopathy(March, 2013) 2. Biopsy as well as peripheral blood be suggestive of chronic lymphocytic leukemia (April, 2013) 3. Started on bendamustin and Rituxan from May of 2013 4. Has finished her 6 cycles of chemotherapy with Rituxan and bendamustine (October 26, 2011) 5.recurrent and progressive disease with progressive anemia.  Patient was started on IBRUTANIB (March, 2016)  taking 140 mg 3 pills a day  6. Patient was taken off IBRUTANIB in August of 2016 because of persistent myelosuppression even with reducing dose 7. Because of rising WBC count and anemia patient would be started back on a lower dose of improved on ibrutanib 140 mg 2 tablets a day     Chronic lymphocytic leukemia ([place])   06/06/2014 Initial Diagnosis    Chronic lymphocytic leukemia        INTERVAL HISTORY:  Heather [name] 82 y.o.  female pleasant patient above history of CLL currently on ibrutinib since March 2016 is here for follow-up.  She continues to complain of swelling in the legs. Continues to complain of mild dizziness especially with moving ahead. No syncopal episodes. She is to have ecchymosis of the left foot in the last 2 days. . Denies any syncopal episode or falls. Patient denies any chest pain denies any shortness of breath denies any palpitations. Denies any diarrhea. No nausea no vomiting. No diarrhea.  Denies any weight loss or night sweats.    REVIEW OF SYSTEMS:  A complete 10 point review of system is done which is negative except mentioned above/history of present illness.   PAST MEDICAL HISTORY :  Past Medical History:  Diagnosis Date  . Anemia   . Cataracts, bilateral   . Chronic lymphocytic leukemia ([place]) 06/06/2014  . Hearing loss   .  History of chemotherapy   . Hyperlipidemia   . Hypertension   . Hypothyroidism   . Palpitations   . Risk for falls   . Thrombocytopenia ([place])     PAST SURGICAL HISTORY :   Past Surgical History:  Procedure Laterality Date  . CATARACT EXTRACTION Left 03/29/2012  . Excision left cervical node biopsy  05/04/2011    FAMILY HISTORY :  No family history on file.  SOCIAL HISTORY:   Social History  Substance Use Topics  . Smoking status: Never Smoker  . Smokeless tobacco: Never Used  . Alcohol use No    ALLERGIES:  has No Known Allergies.  MEDICATIONS:  Current Outpatient Prescriptions  Medication Sig Dispense Refill  . aspirin EC 81 MG tablet Take 81 mg by mouth daily.    Marland Kitchen diltiazem (CARDIZEM CD) 240 MG 24 hr capsule Take 240 mg by mouth daily.     Marland Kitchen ibrutinib (IMBRUVICA) 140 MG capsul Take 2 capsules (280 mg total) by mouth daily. 60 capsule 3  . levothyroxine (SYNTHROID, LEVOTHROID) 75 MCG tablet Take 75 mcg by mouth daily before breakfast.     . pravastatin (PRAVACHOL) 20 MG tablet     . meclizine (ANTIVERT) 12.5 MG tablet Take 1 tablet (12.5 mg total) by mouth at bedtime as needed. 30 tablet 3   No current facility-administered medications for this visit.     PHYSICAL EXAMINATION: ECOG PERFORMANCE STATUS: 1 - Symptomatic but completely ambulatory  BP (!) 148/74 (BP Location: Left Arm, Patient Position:  Sitting)   Pulse 74   Temp 98.6 F (37 C) (Tympanic)   Resp 18   Wt 154 lb (69.9 kg)   BMI 28.17 kg/m   Filed Weights   09/20/15 1157  Weight: 154 lb (69.9 kg)    GENERAL: Well-nourished well-developed; Alert, no distress and comfortable.  Walks with a rolling walker. She is accompanied by her sister. EYES: no pallor or icterus OROPHARYNX: no thrush or ulceration;  NECK: supple, no masses felt LYMPH:  no palpable lymphadenopathy in the cervical, axillary or inguinal regions LUNGS: clear to auscultation and  No wheeze or crackles HEART/CVS: regular rate &  rhythm and no murmurs; Bilateral 2+ lower extremity edema ABDOMEN:abdomen soft, non-tender and normal bowel sounds Musculoskeletal:no cyanosis of digits and no clubbing  PSYCH: alert & oriented x 3 with fluent speech NEURO: no focal motor/sensory deficits SKIN:  Ecchymosis noted on the dorsal surface of the left foot  LABORATORY DATA:  I have reviewed the data as listed    Component Value Date/Time   NA 136 09/20/2015 1059   NA 133 (L) 05/08/2014 1349   K 3.4 (L) 09/20/2015 1059   K 3.6 05/08/2014 1349   CL 104 09/20/2015 1059   CL 102 05/08/2014 1349   CO2 24 09/20/2015 1059   CO2 26 05/08/2014 1349   GLUCOSE 105 (H) 09/20/2015 1059   GLUCOSE 82 05/08/2014 1349   BUN 14 09/20/2015 1059   BUN 18 05/08/2014 1349   CREATININE 0.83 09/20/2015 1059   CREATININE 1.13 (H) 05/08/2014 1349   CALCIUM 9.3 09/20/2015 1059   CALCIUM 9.1 05/08/2014 1349   PROT 7.3 09/20/2015 1059   PROT 6.7 05/08/2014 1349   ALBUMIN 4.8 09/20/2015 1059   ALBUMIN 4.2 05/08/2014 1349   AST 25 09/20/2015 1059   AST 24 05/08/2014 1349   ALT 15 09/20/2015 1059   ALT 13 (L) 05/08/2014 1349   ALKPHOS 52 09/20/2015 1059   ALKPHOS 41 05/08/2014 1349   BILITOT 1.2 09/20/2015 1059   BILITOT 1.3 (H) 05/08/2014 1349   GFRNONAA >60 09/20/2015 1059   GFRNONAA 45 (L) 05/08/2014 1349   GFRAA >60 09/20/2015 1059   GFRAA 52 (L) 05/08/2014 1349    No results found for: SPEP, UPEP  Lab Results  Component Value Date   WBC 4.6 09/20/2015   NEUTROABS 1.4 09/20/2015   HGB 15.2 09/20/2015   HCT 43.6 09/20/2015   MCV 83.6 09/20/2015   PLT 89 (L) 09/20/2015      Chemistry      Component Value Date/Time   NA 136 09/20/2015 1059   NA 133 (L) 05/08/2014 1349   K 3.4 (L) 09/20/2015 1059   K 3.6 05/08/2014 1349   CL 104 09/20/2015 1059   CL 102 05/08/2014 1349   CO2 24 09/20/2015 1059   CO2 26 05/08/2014 1349   BUN 14 09/20/2015 1059   BUN 18 05/08/2014 1349   CREATININE 0.83 09/20/2015 1059   CREATININE  1.13 (H) 05/08/2014 1349      Component Value Date/Time   CALCIUM 9.3 09/20/2015 1059   CALCIUM 9.1 05/08/2014 1349   ALKPHOS 52 09/20/2015 1059   ALKPHOS 41 05/08/2014 1349   AST 25 09/20/2015 1059   AST 24 05/08/2014 1349   ALT 15 09/20/2015 1059   ALT 13 (L) 05/08/2014 1349   BILITOT 1.2 09/20/2015 1059   BILITOT 1.3 (H) 05/08/2014 1349       RADIOGRAPHIC STUDIES: I have personally reviewed the radiological images as listed  and agreed with the findings in the report. No results found.   ASSESSMENT & PLAN:  Chronic lymphocytic leukemia ([place]) CLL on imbruvica- since March 2016 currently on 2 pills secondary to intolerance. Tolerating the current dose very well White count hemoglobin normal platelets slightly low at 89. Continue ibrutinib at this time.  # ecchmosis of left dorsal foot- from ibrutinib. ice pack; monitor for now. HOLD asprin.   # dizziness- ? Vertigo- recommend  Meclizine qhs prn.   # Bil ankle swelling- ? Dependant edema- recommend stocking/leg elevation. Also discussed with Dr. Hall Busing; question Cardizem recommend alternative antihypertensive.  # Follow-up in 4 weeks with CBC CMP/LDH   Orders Placed This Encounter  Procedures  . Comprehensive metabolic panel    Standing Status:   Future    Standing Expiration Date:   09/19/2016  . CBC with Differential    Standing Status:   Future    Standing Expiration Date:   09/19/2016  . Lactate dehydrogenase    Standing Status:   Future    Standing Expiration Date:   09/19/2016   All questions were answered. The patient knows to call the clinic with any problems, questions or concerns.      Cammie Sickle, MD 09/20/2015 1:04 PM

## 2015-09-20 NOTE — Assessment & Plan Note (Addendum)
CLL on imbruvica- since March 2016 currently on 2 pills secondary to intolerance. Tolerating the current dose very well White count hemoglobin normal platelets slightly low at 89. Continue ibrutinib at this time.  # ecchmosis of left dorsal foot- from ibrutinib. ice pack; monitor for now. HOLD asprin.   # dizziness- ? Vertigo- recommend  Meclizine qhs prn.   # Bil ankle swelling- ? Dependant edema- recommend stocking/leg elevation. Also discussed with Dr. Hall Busing; question Cardizem recommend alternative antihypertensive.  # Follow-up in 4 weeks with CBC CMP/LDH

## 2015-10-21 ENCOUNTER — Inpatient Hospital Stay: Payer: Medicare Other | Attending: Internal Medicine

## 2015-10-21 ENCOUNTER — Inpatient Hospital Stay (HOSPITAL_BASED_OUTPATIENT_CLINIC_OR_DEPARTMENT_OTHER): Payer: Medicare Other | Admitting: Internal Medicine

## 2015-10-21 ENCOUNTER — Encounter: Payer: Self-pay | Admitting: Internal Medicine

## 2015-10-21 VITALS — BP 173/74 | HR 77 | Temp 97.4°F | Resp 18 | Ht 62.0 in | Wt 159.2 lb

## 2015-10-21 DIAGNOSIS — Z9221 Personal history of antineoplastic chemotherapy: Secondary | ICD-10-CM

## 2015-10-21 DIAGNOSIS — Z79899 Other long term (current) drug therapy: Secondary | ICD-10-CM

## 2015-10-21 DIAGNOSIS — E785 Hyperlipidemia, unspecified: Secondary | ICD-10-CM | POA: Insufficient documentation

## 2015-10-21 DIAGNOSIS — D649 Anemia, unspecified: Secondary | ICD-10-CM

## 2015-10-21 DIAGNOSIS — E039 Hypothyroidism, unspecified: Secondary | ICD-10-CM | POA: Diagnosis not present

## 2015-10-21 DIAGNOSIS — M7989 Other specified soft tissue disorders: Secondary | ICD-10-CM | POA: Insufficient documentation

## 2015-10-21 DIAGNOSIS — R42 Dizziness and giddiness: Secondary | ICD-10-CM

## 2015-10-21 DIAGNOSIS — C9110 Chronic lymphocytic leukemia of B-cell type not having achieved remission: Secondary | ICD-10-CM | POA: Insufficient documentation

## 2015-10-21 DIAGNOSIS — R609 Edema, unspecified: Secondary | ICD-10-CM | POA: Insufficient documentation

## 2015-10-21 DIAGNOSIS — D696 Thrombocytopenia, unspecified: Secondary | ICD-10-CM | POA: Diagnosis not present

## 2015-10-21 DIAGNOSIS — I10 Essential (primary) hypertension: Secondary | ICD-10-CM | POA: Diagnosis not present

## 2015-10-21 LAB — CBC WITH DIFFERENTIAL/PLATELET
BASOS PCT: 2 %
Basophils Absolute: 0.1 10*3/uL (ref 0–0.1)
EOS ABS: 0 10*3/uL (ref 0–0.7)
Eosinophils Relative: 1 %
HCT: 44.7 % (ref 35.0–47.0)
HEMOGLOBIN: 15.3 g/dL (ref 12.0–16.0)
LYMPHS ABS: 2.3 10*3/uL (ref 1.0–3.6)
Lymphocytes Relative: 58 %
MCH: 29 pg (ref 26.0–34.0)
MCHC: 34.2 g/dL (ref 32.0–36.0)
MCV: 84.6 fL (ref 80.0–100.0)
Monocytes Absolute: 0.3 10*3/uL (ref 0.2–0.9)
Monocytes Relative: 7 %
NEUTROS ABS: 1.2 10*3/uL — AB (ref 1.4–6.5)
NEUTROS PCT: 32 %
Platelets: 96 10*3/uL — ABNORMAL LOW (ref 150–440)
RBC: 5.28 MIL/uL — ABNORMAL HIGH (ref 3.80–5.20)
RDW: 13.9 % (ref 11.5–14.5)
WBC: 3.9 10*3/uL (ref 3.6–11.0)

## 2015-10-21 LAB — COMPREHENSIVE METABOLIC PANEL
ALT: 13 U/L — AB (ref 14–54)
ANION GAP: 7 (ref 5–15)
AST: 25 U/L (ref 15–41)
Albumin: 4.6 g/dL (ref 3.5–5.0)
Alkaline Phosphatase: 48 U/L (ref 38–126)
BUN: 15 mg/dL (ref 6–20)
CHLORIDE: 105 mmol/L (ref 101–111)
CO2: 25 mmol/L (ref 22–32)
CREATININE: 0.9 mg/dL (ref 0.44–1.00)
Calcium: 9.4 mg/dL (ref 8.9–10.3)
GFR, EST NON AFRICAN AMERICAN: 57 mL/min — AB (ref >60)
Glucose, Bld: 104 mg/dL — ABNORMAL HIGH (ref 65–99)
POTASSIUM: 3.4 mmol/L — AB (ref 3.5–5.1)
SODIUM: 137 mmol/L (ref 135–145)
Total Bilirubin: 1.3 mg/dL — ABNORMAL HIGH (ref 0.3–1.2)
Total Protein: 7.5 g/dL (ref 6.5–8.1)

## 2015-10-21 LAB — LACTATE DEHYDROGENASE: LDH: 283 U/L — AB (ref 98–192)

## 2015-10-21 NOTE — Progress Notes (Signed)
[place] OFFICE PROGRESS NOTE  Patient Care Team: Albina Billet, MD as PCP - General (Internal Medicine)  No matching staging information was found for the patient.   Oncology History   1. Ultrasound revealed abdominal lymphadenopathy(March, 2013) 2. Biopsy as well as peripheral blood be suggestive of chronic lymphocytic leukemia (April, 2013) 3. Started on bendamustin and Rituxan from May of 2013 4. Has finished her 6 cycles of chemotherapy with Rituxan and bendamustine (October 26, 2011) 5.recurrent and progressive disease with progressive anemia.  Patient was started on IBRUTANIB (March, 2016)  taking 140 mg 3 pills a day  6. Patient was taken off IBRUTANIB in August of 2016 because of persistent myelosuppression even with reducing dose 7. Because of rising WBC count and anemia patient would be started back on a lower dose of improved on ibrutanib 140 mg 2 tablets a day     Chronic lymphocytic leukemia ([place])   06/06/2014 Initial Diagnosis    Chronic lymphocytic leukemia        INTERVAL HISTORY:  Heather [name] 82 y.o.  female pleasant patient above history of CLL currently on ibrutinib since March 2016 is here for follow-up.  Patient complains of mild swelling in the legs also improved since changing her blood pressure medication. Mild diarrhea 1-2 loose stools a day. This is not any worse. Dizziness improved since she is taking meclizine as needed nighttime.  Denies any syncopal episode or falls. Patient denies any chest pain denies any shortness of breath denies any palpitations. No nausea no vomiting.  Denies any weight loss or night sweats.    REVIEW OF SYSTEMS:  A complete 10 point review of system is done which is negative except mentioned above/history of present illness.   PAST MEDICAL HISTORY :  Past Medical History:  Diagnosis Date  . Anemia   . Cataracts, bilateral   . Chronic lymphocytic leukemia ([place]) 06/06/2014  . Hearing loss   .  History of chemotherapy   . Hyperlipidemia   . Hypertension   . Hypothyroidism   . Palpitations   . Risk for falls   . Thrombocytopenia ([place])     PAST SURGICAL HISTORY :   Past Surgical History:  Procedure Laterality Date  . CATARACT EXTRACTION Left 03/29/2012  . Excision left cervical node biopsy  05/04/2011    FAMILY HISTORY :  History reviewed. No pertinent family history.  SOCIAL HISTORY:   Social History  Substance Use Topics  . Smoking status: Never Smoker  . Smokeless tobacco: Never Used  . Alcohol use No    ALLERGIES:  has No Known Allergies.  MEDICATIONS:  Current Outpatient Prescriptions  Medication Sig Dispense Refill  . ibrutinib (IMBRUVICA) 140 MG capsul Take 2 capsules (280 mg total) by mouth daily. 60 capsule 3  . levothyroxine (SYNTHROID, LEVOTHROID) 75 MCG tablet Take 75 mcg by mouth daily before breakfast.     . losartan (COZAAR) 50 MG tablet     . meclizine (ANTIVERT) 12.5 MG tablet Take 1 tablet (12.5 mg total) by mouth at bedtime as needed. 30 tablet 3  . diltiazem (CARDIZEM CD) 240 MG 24 hr capsule Take 240 mg by mouth daily.     . pravastatin (PRAVACHOL) 20 MG tablet      No current facility-administered medications for this visit.     PHYSICAL EXAMINATION: ECOG PERFORMANCE STATUS: 1 - Symptomatic but completely ambulatory  BP (!) 173/74 (BP Location: Left Arm, Patient Position: Sitting)   Pulse 77  Temp 97.4 F (36.3 C) (Tympanic)   Resp 18   Ht 5\' 2"  (1.575 m)   Wt 159 lb 3.2 oz (72.2 kg)   BMI 29.12 kg/m   Filed Weights   10/21/15 1058  Weight: 159 lb 3.2 oz (72.2 kg)    GENERAL: Well-nourished well-developed; Alert, no distress and comfortable.  Walks with a rolling walker. She is accompanied by her sister. EYES: no pallor or icterus OROPHARYNX: no thrush or ulceration;  NECK: supple, no masses felt LYMPH:  no palpable lymphadenopathy in the cervical, axillary or inguinal regions LUNGS: clear to auscultation and  No wheeze or  crackles HEART/CVS: regular rate & rhythm and no murmurs; Bilateral 2+ lower extremity edema ABDOMEN:abdomen soft, non-tender and normal bowel sounds Musculoskeletal:no cyanosis of digits and no clubbing  PSYCH: alert & oriented x 3 with fluent speech NEURO: no focal motor/sensory deficits SKIN:  Ecchymosis noted on the dorsal surface of the left foot  LABORATORY DATA:  I have reviewed the data as listed    Component Value Date/Time   NA 137 10/21/2015 1000   NA 133 (L) 05/08/2014 1349   K 3.4 (L) 10/21/2015 1000   K 3.6 05/08/2014 1349   CL 105 10/21/2015 1000   CL 102 05/08/2014 1349   CO2 25 10/21/2015 1000   CO2 26 05/08/2014 1349   GLUCOSE 104 (H) 10/21/2015 1000   GLUCOSE 82 05/08/2014 1349   BUN 15 10/21/2015 1000   BUN 18 05/08/2014 1349   CREATININE 0.90 10/21/2015 1000   CREATININE 1.13 (H) 05/08/2014 1349   CALCIUM 9.4 10/21/2015 1000   CALCIUM 9.1 05/08/2014 1349   PROT 7.5 10/21/2015 1000   PROT 6.7 05/08/2014 1349   ALBUMIN 4.6 10/21/2015 1000   ALBUMIN 4.2 05/08/2014 1349   AST 25 10/21/2015 1000   AST 24 05/08/2014 1349   ALT 13 (L) 10/21/2015 1000   ALT 13 (L) 05/08/2014 1349   ALKPHOS 48 10/21/2015 1000   ALKPHOS 41 05/08/2014 1349   BILITOT 1.3 (H) 10/21/2015 1000   BILITOT 1.3 (H) 05/08/2014 1349   GFRNONAA 57 (L) 10/21/2015 1000   GFRNONAA 45 (L) 05/08/2014 1349   GFRAA >60 10/21/2015 1000   GFRAA 52 (L) 05/08/2014 1349    No results found for: SPEP, UPEP  Lab Results  Component Value Date   WBC 3.9 10/21/2015   NEUTROABS 1.2 (L) 10/21/2015   HGB 15.3 10/21/2015   HCT 44.7 10/21/2015   MCV 84.6 10/21/2015   PLT 96 (L) 10/21/2015      Chemistry      Component Value Date/Time   NA 137 10/21/2015 1000   NA 133 (L) 05/08/2014 1349   K 3.4 (L) 10/21/2015 1000   K 3.6 05/08/2014 1349   CL 105 10/21/2015 1000   CL 102 05/08/2014 1349   CO2 25 10/21/2015 1000   CO2 26 05/08/2014 1349   BUN 15 10/21/2015 1000   BUN 18 05/08/2014 1349    CREATININE 0.90 10/21/2015 1000   CREATININE 1.13 (H) 05/08/2014 1349      Component Value Date/Time   CALCIUM 9.4 10/21/2015 1000   CALCIUM 9.1 05/08/2014 1349   ALKPHOS 48 10/21/2015 1000   ALKPHOS 41 05/08/2014 1349   AST 25 10/21/2015 1000   AST 24 05/08/2014 1349   ALT 13 (L) 10/21/2015 1000   ALT 13 (L) 05/08/2014 1349   BILITOT 1.3 (H) 10/21/2015 1000   BILITOT 1.3 (H) 05/08/2014 1349       RADIOGRAPHIC  STUDIES: I have personally reviewed the radiological images as listed and agreed with the findings in the report. No results found.   ASSESSMENT & PLAN:  Chronic lymphocytic leukemia ([place]) CLL on imbruvica- since March 2016 currently on 2 pills secondary to intolerance. Tolerating the current dose very well White count hemoglobin normal platelets slightly low at 96. Continue ibrutinib at this time.  # ecchmosis of left dorsal foot- from ibrutinib. ice pack; improved.   # dizziness- ? Vertigo- recommend  Meclizine qhs improved.   # Bil ankle swelling- ? Dependant edema- off cardizem; on cozaar; improved.   # Follow-up in 8  weeks with CBC CMP/LDH   No orders of the defined types were placed in this encounter.  All questions were answered. The patient knows to call the clinic with any problems, questions or concerns.      Cammie Sickle, MD 10/21/2015 12:39 PM

## 2015-10-21 NOTE — Progress Notes (Signed)
Pt reports Diarrhea less than 4 bm's a day

## 2015-10-21 NOTE — Assessment & Plan Note (Addendum)
CLL on imbruvica- since March 2016 currently on 2 pills secondary to intolerance. Tolerating the current dose very well White count hemoglobin normal platelets slightly low at 96. Continue ibrutinib at this time.  # ecchmosis of left dorsal foot- from ibrutinib. ice pack; improved.   # dizziness- ? Vertigo- recommend  Meclizine qhs improved.   # Bil ankle swelling- ? Dependant edema- off cardizem; on cozaar; improved.   # Follow-up in 8  weeks with CBC CMP/LDH

## 2015-10-22 ENCOUNTER — Other Ambulatory Visit: Payer: Self-pay

## 2015-10-22 DIAGNOSIS — C9110 Chronic lymphocytic leukemia of B-cell type not having achieved remission: Secondary | ICD-10-CM

## 2015-12-16 ENCOUNTER — Inpatient Hospital Stay: Payer: Medicare Other | Attending: Internal Medicine

## 2015-12-16 ENCOUNTER — Inpatient Hospital Stay (HOSPITAL_BASED_OUTPATIENT_CLINIC_OR_DEPARTMENT_OTHER): Payer: Medicare Other | Admitting: Internal Medicine

## 2015-12-16 VITALS — BP 145/108 | HR 81 | Temp 97.1°F | Wt 159.5 lb

## 2015-12-16 DIAGNOSIS — Z9221 Personal history of antineoplastic chemotherapy: Secondary | ICD-10-CM | POA: Diagnosis not present

## 2015-12-16 DIAGNOSIS — E785 Hyperlipidemia, unspecified: Secondary | ICD-10-CM | POA: Diagnosis not present

## 2015-12-16 DIAGNOSIS — I10 Essential (primary) hypertension: Secondary | ICD-10-CM | POA: Insufficient documentation

## 2015-12-16 DIAGNOSIS — C9110 Chronic lymphocytic leukemia of B-cell type not having achieved remission: Secondary | ICD-10-CM | POA: Diagnosis not present

## 2015-12-16 DIAGNOSIS — D696 Thrombocytopenia, unspecified: Secondary | ICD-10-CM

## 2015-12-16 DIAGNOSIS — Z79899 Other long term (current) drug therapy: Secondary | ICD-10-CM | POA: Diagnosis not present

## 2015-12-16 DIAGNOSIS — M25473 Effusion, unspecified ankle: Secondary | ICD-10-CM

## 2015-12-16 DIAGNOSIS — D649 Anemia, unspecified: Secondary | ICD-10-CM | POA: Insufficient documentation

## 2015-12-16 DIAGNOSIS — M7989 Other specified soft tissue disorders: Secondary | ICD-10-CM | POA: Insufficient documentation

## 2015-12-16 DIAGNOSIS — E039 Hypothyroidism, unspecified: Secondary | ICD-10-CM

## 2015-12-16 DIAGNOSIS — R002 Palpitations: Secondary | ICD-10-CM | POA: Insufficient documentation

## 2015-12-16 LAB — CBC WITH DIFFERENTIAL/PLATELET
BASOS PCT: 2 %
Basophils Absolute: 0.1 10*3/uL (ref 0–0.1)
Eosinophils Absolute: 0 10*3/uL (ref 0–0.7)
Eosinophils Relative: 1 %
HEMATOCRIT: 45.3 % (ref 35.0–47.0)
HEMOGLOBIN: 15.4 g/dL (ref 12.0–16.0)
LYMPHS PCT: 51 %
Lymphs Abs: 1.5 10*3/uL (ref 1.0–3.6)
MCH: 28.7 pg (ref 26.0–34.0)
MCHC: 34 g/dL (ref 32.0–36.0)
MCV: 84.5 fL (ref 80.0–100.0)
MONO ABS: 0.3 10*3/uL (ref 0.2–0.9)
MONOS PCT: 10 %
NEUTROS ABS: 1.1 10*3/uL — AB (ref 1.4–6.5)
NEUTROS PCT: 36 %
Platelets: 96 10*3/uL — ABNORMAL LOW (ref 150–440)
RBC: 5.36 MIL/uL — ABNORMAL HIGH (ref 3.80–5.20)
RDW: 13.6 % (ref 11.5–14.5)
WBC: 3 10*3/uL — ABNORMAL LOW (ref 3.6–11.0)

## 2015-12-16 LAB — COMPREHENSIVE METABOLIC PANEL
ALBUMIN: 4.4 g/dL (ref 3.5–5.0)
ALK PHOS: 46 U/L (ref 38–126)
ALT: 16 U/L (ref 14–54)
ANION GAP: 10 (ref 5–15)
AST: 25 U/L (ref 15–41)
BILIRUBIN TOTAL: 1.3 mg/dL — AB (ref 0.3–1.2)
BUN: 16 mg/dL (ref 6–20)
CALCIUM: 9.3 mg/dL (ref 8.9–10.3)
CO2: 24 mmol/L (ref 22–32)
Chloride: 103 mmol/L (ref 101–111)
Creatinine, Ser: 0.9 mg/dL (ref 0.44–1.00)
GFR calc Af Amer: >60 mL/min (ref >60)
GFR, EST NON AFRICAN AMERICAN: 57 mL/min — AB (ref >60)
GLUCOSE: 107 mg/dL — AB (ref 65–99)
Potassium: 3.7 mmol/L (ref 3.5–5.1)
Sodium: 137 mmol/L (ref 135–145)
TOTAL PROTEIN: 7.1 g/dL (ref 6.5–8.1)

## 2015-12-16 LAB — LACTATE DEHYDROGENASE: LDH: 261 U/L — ABNORMAL HIGH (ref 98–192)

## 2015-12-16 NOTE — Assessment & Plan Note (Addendum)
CLL on imbruvica- since March 2016 currently on 2 pills secondary to intolerance. Tolerating the current dose very well White count hemoglobin normal platelets slightly low at 96. Continue ibrutinib at this time. Last CT scan in 2015. CT C/A/P in 2 months. Ordered today  # Bil ankle swelling- ? Dependant edema- off cardizem; on cozaar; improved.   # Elevated blood pressure recommend checking at home if not improved; deferred to PCP  # Follow-up in 8  weeks with CBC CMP/LDH/ scans prior.

## 2015-12-16 NOTE — Progress Notes (Signed)
[place] OFFICE PROGRESS NOTE  Patient Care Team: Albina Billet, MD as PCP - General (Internal Medicine)  No matching staging information was found for the patient.   Oncology History   1. Ultrasound revealed abdominal lymphadenopathy(March, 2013) 2. Biopsy as well as peripheral blood be suggestive of chronic lymphocytic leukemia (April, 2013) 3. Started on bendamustin and Rituxan from May of 2013 4. Has finished her 6 cycles of chemotherapy with Rituxan and bendamustine (October 26, 2011) 5.recurrent and progressive disease with progressive anemia.  Patient was started on IBRUTANIB (March, 2016)  taking 140 mg 3 pills a day  6. Patient was taken off IBRUTANIB in August of 2016 because of persistent myelosuppression even with reducing dose 7. Because of rising WBC count and anemia patient would be started back on a lower dose of improved on ibrutanib 140 mg 2 tablets a day     Chronic lymphocytic leukemia ([place])   06/06/2014 Initial Diagnosis    Chronic lymphocytic leukemia        INTERVAL HISTORY:  Heather [name] 82 y.o.  female pleasant patient above history of CLL currently on ibrutinib since March 2016 is here for follow-up.  Patient continues to chronic mild swelling in the legs bilateral. Overall improved. No significant diarrhea. No dizziness; as needed meclizine. No unusual weight loss or night sweats. No lumps or bumps.. Patient denies any chest pain denies any shortness of breath denies any palpitations. No nausea no vomiting. Mild easy bruising. No spontaneous ecchymosis.    REVIEW OF SYSTEMS:  A complete 10 point review of system is done which is negative except mentioned above/history of present illness.   PAST MEDICAL HISTORY :  Past Medical History:  Diagnosis Date  . Anemia   . Cataracts, bilateral   . Chronic lymphocytic leukemia ([place]) 06/06/2014  . Hearing loss   . History of chemotherapy   . Hyperlipidemia   . Hypertension   .  Hypothyroidism   . Palpitations   . Risk for falls   . Thrombocytopenia ([place])     PAST SURGICAL HISTORY :   Past Surgical History:  Procedure Laterality Date  . CATARACT EXTRACTION Left 03/29/2012  . Excision left cervical node biopsy  05/04/2011    FAMILY HISTORY :  No family history on file.  SOCIAL HISTORY:   Social History  Substance Use Topics  . Smoking status: Never Smoker  . Smokeless tobacco: Never Used  . Alcohol use No    ALLERGIES:  has No Known Allergies.  MEDICATIONS:  Current Outpatient Prescriptions  Medication Sig Dispense Refill  . ibrutinib (IMBRUVICA) 140 MG capsul Take 2 capsules (280 mg total) by mouth daily. 60 capsule 3  . levothyroxine (SYNTHROID, LEVOTHROID) 75 MCG tablet Take 75 mcg by mouth daily before breakfast.     . losartan (COZAAR) 50 MG tablet     . meclizine (ANTIVERT) 12.5 MG tablet Take 1 tablet (12.5 mg total) by mouth at bedtime as needed. 30 tablet 3   No current facility-administered medications for this visit.     PHYSICAL EXAMINATION: ECOG PERFORMANCE STATUS: 1 - Symptomatic but completely ambulatory  BP (!) 145/108 (BP Location: Left Arm, Patient Position: Sitting)   Pulse 81   Temp 97.1 F (36.2 C) (Tympanic)   Wt 159 lb 8 oz (72.3 kg)   BMI 29.17 kg/m   Filed Weights   12/16/15 1201  Weight: 159 lb 8 oz (72.3 kg)    GENERAL: Well-nourished well-developed; Alert, no distress and  comfortable.  Walks with a rolling walker. She is accompanied by her sister. EYES: no pallor or icterus OROPHARYNX: no thrush or ulceration;  NECK: supple, no masses felt LYMPH:  no palpable lymphadenopathy in the cervical, axillary or inguinal regions LUNGS: clear to auscultation and  No wheeze or crackles HEART/CVS: regular rate & rhythm and no murmurs; Bilateral 2+ lower extremity edema ABDOMEN:abdomen soft, non-tender and normal bowel sounds Musculoskeletal:no cyanosis of digits and no clubbing  PSYCH: alert & oriented x 3 with  fluent speech NEURO: no focal motor/sensory deficits SKIN: no lesions noted.   LABORATORY DATA:  I have reviewed the data as listed    Component Value Date/Time   NA 137 12/16/2015 1044   NA 133 (L) 05/08/2014 1349   K 3.7 12/16/2015 1044   K 3.6 05/08/2014 1349   CL 103 12/16/2015 1044   CL 102 05/08/2014 1349   CO2 24 12/16/2015 1044   CO2 26 05/08/2014 1349   GLUCOSE 107 (H) 12/16/2015 1044   GLUCOSE 82 05/08/2014 1349   BUN 16 12/16/2015 1044   BUN 18 05/08/2014 1349   CREATININE 0.90 12/16/2015 1044   CREATININE 1.13 (H) 05/08/2014 1349   CALCIUM 9.3 12/16/2015 1044   CALCIUM 9.1 05/08/2014 1349   PROT 7.1 12/16/2015 1044   PROT 6.7 05/08/2014 1349   ALBUMIN 4.4 12/16/2015 1044   ALBUMIN 4.2 05/08/2014 1349   AST 25 12/16/2015 1044   AST 24 05/08/2014 1349   ALT 16 12/16/2015 1044   ALT 13 (L) 05/08/2014 1349   ALKPHOS 46 12/16/2015 1044   ALKPHOS 41 05/08/2014 1349   BILITOT 1.3 (H) 12/16/2015 1044   BILITOT 1.3 (H) 05/08/2014 1349   GFRNONAA 57 (L) 12/16/2015 1044   GFRNONAA 45 (L) 05/08/2014 1349   GFRAA >60 12/16/2015 1044   GFRAA 52 (L) 05/08/2014 1349    No results found for: SPEP, UPEP  Lab Results  Component Value Date   WBC 3.0 (L) 12/16/2015   NEUTROABS 1.1 (L) 12/16/2015   HGB 15.4 12/16/2015   HCT 45.3 12/16/2015   MCV 84.5 12/16/2015   PLT 96 (L) 12/16/2015      Chemistry      Component Value Date/Time   NA 137 12/16/2015 1044   NA 133 (L) 05/08/2014 1349   K 3.7 12/16/2015 1044   K 3.6 05/08/2014 1349   CL 103 12/16/2015 1044   CL 102 05/08/2014 1349   CO2 24 12/16/2015 1044   CO2 26 05/08/2014 1349   BUN 16 12/16/2015 1044   BUN 18 05/08/2014 1349   CREATININE 0.90 12/16/2015 1044   CREATININE 1.13 (H) 05/08/2014 1349      Component Value Date/Time   CALCIUM 9.3 12/16/2015 1044   CALCIUM 9.1 05/08/2014 1349   ALKPHOS 46 12/16/2015 1044   ALKPHOS 41 05/08/2014 1349   AST 25 12/16/2015 1044   AST 24 05/08/2014 1349   ALT  16 12/16/2015 1044   ALT 13 (L) 05/08/2014 1349   BILITOT 1.3 (H) 12/16/2015 1044   BILITOT 1.3 (H) 05/08/2014 1349       RADIOGRAPHIC STUDIES: I have personally reviewed the radiological images as listed and agreed with the findings in the report. No results found.   ASSESSMENT & PLAN:  Chronic lymphocytic leukemia ([place]) CLL on imbruvica- since March 2016 currently on 2 pills secondary to intolerance. Tolerating the current dose very well White count hemoglobin normal platelets slightly low at 96. Continue ibrutinib at this time. Last CT scan  in 2015. CT C/A/P in 2 months. Ordered today  # Bil ankle swelling- ? Dependant edema- off cardizem; on cozaar; improved.   # Elevated blood pressure recommend checking at home if not improved; deferred to PCP  # Follow-up in 8  weeks with CBC CMP/LDH/ scans prior.   Orders Placed This Encounter  Procedures  . CT CHEST W CONTRAST    Standing Status:   Future    Standing Expiration Date:   02/14/2017    Order Specific Question:   Reason for Exam (SYMPTOM  OR DIAGNOSIS REQUIRED)    Answer:   CLL    Order Specific Question:   Preferred imaging location?    Answer:   [facility]  . CT ABDOMEN PELVIS W CONTRAST    Standing Status:   Future    Standing Expiration Date:   03/16/2017    Order Specific Question:   Reason for Exam (SYMPTOM  OR DIAGNOSIS REQUIRED)    Answer:   CLL    Order Specific Question:   Preferred imaging location?    Answer:   [facility]  . CBC with Differential    Standing Status:   Future    Standing Expiration Date:   12/15/2016  . Comprehensive metabolic panel    Standing Status:   Future    Standing Expiration Date:   12/15/2016  . Lactate dehydrogenase    Standing Status:   Future    Standing Expiration Date:   12/15/2016   All questions were answered. The patient knows to call the clinic with any problems, questions or concerns.      Cammie Sickle, MD 12/17/2015 11:19 AM

## 2015-12-16 NOTE — Progress Notes (Signed)
Patient here today for follow up on Chronic lymphocytic leukemia.  Patient states she has no concerns today

## 2016-02-07 ENCOUNTER — Ambulatory Visit: Admission: RE | Admit: 2016-02-07 | Payer: Medicare Other | Source: Ambulatory Visit

## 2016-02-10 ENCOUNTER — Inpatient Hospital Stay: Payer: Medicare Other | Attending: Internal Medicine

## 2016-02-10 ENCOUNTER — Inpatient Hospital Stay (HOSPITAL_BASED_OUTPATIENT_CLINIC_OR_DEPARTMENT_OTHER): Payer: Medicare Other | Admitting: Internal Medicine

## 2016-02-10 DIAGNOSIS — R252 Cramp and spasm: Secondary | ICD-10-CM | POA: Insufficient documentation

## 2016-02-10 DIAGNOSIS — C9110 Chronic lymphocytic leukemia of B-cell type not having achieved remission: Secondary | ICD-10-CM | POA: Diagnosis not present

## 2016-02-10 DIAGNOSIS — E039 Hypothyroidism, unspecified: Secondary | ICD-10-CM

## 2016-02-10 DIAGNOSIS — Z79899 Other long term (current) drug therapy: Secondary | ICD-10-CM

## 2016-02-10 DIAGNOSIS — D649 Anemia, unspecified: Secondary | ICD-10-CM

## 2016-02-10 DIAGNOSIS — Z9221 Personal history of antineoplastic chemotherapy: Secondary | ICD-10-CM | POA: Insufficient documentation

## 2016-02-10 DIAGNOSIS — R002 Palpitations: Secondary | ICD-10-CM

## 2016-02-10 DIAGNOSIS — E785 Hyperlipidemia, unspecified: Secondary | ICD-10-CM

## 2016-02-10 DIAGNOSIS — M7989 Other specified soft tissue disorders: Secondary | ICD-10-CM | POA: Diagnosis not present

## 2016-02-10 DIAGNOSIS — I10 Essential (primary) hypertension: Secondary | ICD-10-CM

## 2016-02-10 DIAGNOSIS — D696 Thrombocytopenia, unspecified: Secondary | ICD-10-CM

## 2016-02-10 LAB — COMPREHENSIVE METABOLIC PANEL
ALBUMIN: 4.3 g/dL (ref 3.5–5.0)
ALT: 15 U/L (ref 14–54)
ANION GAP: 7 (ref 5–15)
AST: 25 U/L (ref 15–41)
Alkaline Phosphatase: 49 U/L (ref 38–126)
BILIRUBIN TOTAL: 1.4 mg/dL — AB (ref 0.3–1.2)
BUN: 15 mg/dL (ref 6–20)
CALCIUM: 9.1 mg/dL (ref 8.9–10.3)
CO2: 24 mmol/L (ref 22–32)
Chloride: 106 mmol/L (ref 101–111)
Creatinine, Ser: 0.83 mg/dL (ref 0.44–1.00)
GFR calc Af Amer: >60 mL/min (ref >60)
GFR calc non Af Amer: >60 mL/min (ref >60)
GLUCOSE: 94 mg/dL (ref 65–99)
Potassium: 3.4 mmol/L — ABNORMAL LOW (ref 3.5–5.1)
SODIUM: 137 mmol/L (ref 135–145)
TOTAL PROTEIN: 6.8 g/dL (ref 6.5–8.1)

## 2016-02-10 LAB — CBC WITH DIFFERENTIAL/PLATELET
BASOS PCT: 2 %
Basophils Absolute: 0 10*3/uL (ref 0–0.1)
Eosinophils Absolute: 0 10*3/uL (ref 0–0.7)
Eosinophils Relative: 1 %
HEMATOCRIT: 44.8 % (ref 35.0–47.0)
Hemoglobin: 15.3 g/dL (ref 12.0–16.0)
Lymphocytes Relative: 55 %
Lymphs Abs: 1.4 10*3/uL (ref 1.0–3.6)
MCH: 29 pg (ref 26.0–34.0)
MCHC: 34.1 g/dL (ref 32.0–36.0)
MCV: 84.9 fL (ref 80.0–100.0)
MONOS PCT: 11 %
Monocytes Absolute: 0.3 10*3/uL (ref 0.2–0.9)
NEUTROS ABS: 0.8 10*3/uL — AB (ref 1.4–6.5)
NEUTROS PCT: 31 %
Platelets: 95 10*3/uL — ABNORMAL LOW (ref 150–440)
RBC: 5.27 MIL/uL — ABNORMAL HIGH (ref 3.80–5.20)
RDW: 13.7 % (ref 11.5–14.5)
WBC: 2.6 10*3/uL — AB (ref 3.6–11.0)

## 2016-02-10 LAB — LACTATE DEHYDROGENASE: LDH: 235 U/L — ABNORMAL HIGH (ref 98–192)

## 2016-02-10 NOTE — Assessment & Plan Note (Addendum)
CLL on imbruvica- since March 2016 currently on 2 pills secondary to intolerance. Stop imbruvica for one month and re-evaluate labs. White count slightly low at 2.6 with a normal hemoglobin. Platelets slightly low at 95. Last CT scan in 2015. CT C/A/P will be done tomorrow 02/11/16. Due to weather and lack of communication CT was re-scheduled. Was supposed to be last week.   # Bil ankle swelling-Resolved- Continue cozaar.   # Cramping: Mostly in fingers. Encouraged her to eat foods rich in potassium. K slightly low at 3.4 today. Talked about potassium rich foods.   # Elevated blood pressure: recommend checking at home if not improved; Patient states that she did not take her BP medication this morning. Deferred to PCP  # Follow-up in 4 weeks with CBC CMP/LDH/and results from scan.

## 2016-02-10 NOTE — Progress Notes (Signed)
[place] OFFICE PROGRESS NOTE  Patient Care Team: Albina Billet, MD as PCP - General (Internal Medicine)  No matching staging information was found for the patient.   Oncology History   1. Ultrasound revealed abdominal lymphadenopathy(March, 2013) 2. Biopsy as well as peripheral blood be suggestive of chronic lymphocytic leukemia (April, 2013) 3. Started on bendamustin and Rituxan from May of 2013 4. Has finished her 6 cycles of chemotherapy with Rituxan and bendamustine (October 26, 2011) 5.recurrent and progressive disease with progressive anemia.  Patient was started on IBRUTANIB (March, 2016)  taking 140 mg 3 pills a day  6. Patient was taken off IBRUTANIB in August of 2016 because of persistent myelosuppression even with reducing dose 7. Because of rising WBC count and anemia patient would be started back on a lower dose of improved on ibrutanib 140 mg 2 tablets a day     Chronic lymphocytic leukemia ([place])   06/06/2014 Initial Diagnosis    Chronic lymphocytic leukemia        INTERVAL HISTORY:  Heather [name] 82 y.o.  female pleasant patient above history of CLL currently on ibrutinib since March 2016 is here for follow-up.  Patient states that chronic mild swelling in the legs is much better since last visit. Overall improved. No significant diarrhea. No dizziness; as needed meclizine. No unusual weight loss or night sweats. No lumps or bumps. She does complain of mild cramping in bilateral hands/fingers that began several weeks ago.Patient denies any chest pain denies any shortness of breath denies any palpitations. No nausea no vomiting. Mild easy bruising. No spontaneous ecchymosis.    REVIEW OF SYSTEMS:  A complete 10 point review of system is done which is negative except mentioned above/history of present illness.   PAST MEDICAL HISTORY :  Past Medical History:  Diagnosis Date  . Anemia   . Cataracts, bilateral   . Chronic lymphocytic leukemia  ([place]) 06/06/2014  . Hearing loss   . History of chemotherapy   . Hyperlipidemia   . Hypertension   . Hypothyroidism   . Palpitations   . Risk for falls   . Thrombocytopenia ([place])     PAST SURGICAL HISTORY :   Past Surgical History:  Procedure Laterality Date  . CATARACT EXTRACTION Left 03/29/2012  . Excision left cervical node biopsy  05/04/2011    FAMILY HISTORY :  No family history on file.  SOCIAL HISTORY:   Social History  Substance Use Topics  . Smoking status: Never Smoker  . Smokeless tobacco: Never Used  . Alcohol use No    ALLERGIES:  has No Known Allergies.  MEDICATIONS:  Current Outpatient Prescriptions  Medication Sig Dispense Refill  . ibrutinib (IMBRUVICA) 140 MG capsul Take 2 capsules (280 mg total) by mouth daily. 60 capsule 3  . levothyroxine (SYNTHROID, LEVOTHROID) 75 MCG tablet Take 75 mcg by mouth daily before breakfast.     . losartan (COZAAR) 100 MG tablet Take 100 mg by mouth daily.    . meclizine (ANTIVERT) 12.5 MG tablet Take 1 tablet (12.5 mg total) by mouth at bedtime as needed. 30 tablet 3   No current facility-administered medications for this visit.     PHYSICAL EXAMINATION: ECOG PERFORMANCE STATUS: 1 - Symptomatic but completely ambulatory  BP (!) 174/72 (BP Location: Left Arm, Patient Position: Sitting)   Pulse 88   Temp 97.5 F (36.4 C) (Tympanic)   Wt 160 lb 4 oz (72.7 kg)   BMI 29.31 kg/m   Filed  Weights   02/10/16 1157  Weight: 160 lb 4 oz (72.7 kg)    GENERAL: Well-nourished well-developed; Alert, no distress and comfortable.  Walks with a rolling walker. She is accompanied by her sister. EYES: no pallor or icterus OROPHARYNX: no thrush or ulceration;  NECK: supple, no masses felt LYMPH:  no palpable lymphadenopathy in the cervical, axillary or inguinal regions LUNGS: clear to auscultation and  No wheeze or crackles HEART/CVS: regular rate & rhythm and no murmurs; Bilateral 2+ lower extremity edema ABDOMEN:abdomen  soft, non-tender and normal bowel sounds Musculoskeletal:no cyanosis of digits and no clubbing  PSYCH: alert & oriented x 3 with fluent speech NEURO: no focal motor/sensory deficits SKIN: no lesions noted.   LABORATORY DATA:  I have reviewed the data as listed    Component Value Date/Time   NA 137 02/10/2016 1130   NA 133 (L) 05/08/2014 1349   K 3.4 (L) 02/10/2016 1130   K 3.6 05/08/2014 1349   CL 106 02/10/2016 1130   CL 102 05/08/2014 1349   CO2 24 02/10/2016 1130   CO2 26 05/08/2014 1349   GLUCOSE 94 02/10/2016 1130   GLUCOSE 82 05/08/2014 1349   BUN 15 02/10/2016 1130   BUN 18 05/08/2014 1349   CREATININE 0.83 02/10/2016 1130   CREATININE 1.13 (H) 05/08/2014 1349   CALCIUM 9.1 02/10/2016 1130   CALCIUM 9.1 05/08/2014 1349   PROT 6.8 02/10/2016 1130   PROT 6.7 05/08/2014 1349   ALBUMIN 4.3 02/10/2016 1130   ALBUMIN 4.2 05/08/2014 1349   AST 25 02/10/2016 1130   AST 24 05/08/2014 1349   ALT 15 02/10/2016 1130   ALT 13 (L) 05/08/2014 1349   ALKPHOS 49 02/10/2016 1130   ALKPHOS 41 05/08/2014 1349   BILITOT 1.4 (H) 02/10/2016 1130   BILITOT 1.3 (H) 05/08/2014 1349   GFRNONAA >60 02/10/2016 1130   GFRNONAA 45 (L) 05/08/2014 1349   GFRAA >60 02/10/2016 1130   GFRAA 52 (L) 05/08/2014 1349    No results found for: SPEP, UPEP  Lab Results  Component Value Date   WBC 2.6 (L) 02/10/2016   NEUTROABS 0.8 (L) 02/10/2016   HGB 15.3 02/10/2016   HCT 44.8 02/10/2016   MCV 84.9 02/10/2016   PLT 95 (L) 02/10/2016      Chemistry      Component Value Date/Time   NA 137 02/10/2016 1130   NA 133 (L) 05/08/2014 1349   K 3.4 (L) 02/10/2016 1130   K 3.6 05/08/2014 1349   CL 106 02/10/2016 1130   CL 102 05/08/2014 1349   CO2 24 02/10/2016 1130   CO2 26 05/08/2014 1349   BUN 15 02/10/2016 1130   BUN 18 05/08/2014 1349   CREATININE 0.83 02/10/2016 1130   CREATININE 1.13 (H) 05/08/2014 1349      Component Value Date/Time   CALCIUM 9.1 02/10/2016 1130   CALCIUM 9.1  05/08/2014 1349   ALKPHOS 49 02/10/2016 1130   ALKPHOS 41 05/08/2014 1349   AST 25 02/10/2016 1130   AST 24 05/08/2014 1349   ALT 15 02/10/2016 1130   ALT 13 (L) 05/08/2014 1349   BILITOT 1.4 (H) 02/10/2016 1130   BILITOT 1.3 (H) 05/08/2014 1349       RADIOGRAPHIC STUDIES: I have personally reviewed the radiological images as listed and agreed with the findings in the report. No results found.   ASSESSMENT & PLAN:  Chronic lymphocytic leukemia ([place]) CLL on imbruvica- since March 2016 currently on 2 pills secondary to intolerance.  Stop imbruvica for one month and re-evaluate labs. White count slightly low at 2.6 with a normal hemoglobin. Platelets slightly low at 95. Last CT scan in 2015. CT C/A/P will be done tomorrow 02/11/16. Due to weather and lack of communication CT was re-scheduled. Was supposed to be last week.   # Bil ankle swelling-Resolved- Continue cozaar.   # Cramping: Mostly in fingers. Encouraged her to eat foods rich in potassium. K slightly low at 3.4 today. Talked about potassium rich foods.   # Elevated blood pressure: recommend checking at home if not improved; Patient states that she did not take her BP medication this morning. Deferred to PCP  # Follow-up in 4 weeks with CBC CMP/LDH/and results from scan.   Orders Placed This Encounter  Procedures  . CBC with Differential/Platelet    Standing Status:   Future    Standing Expiration Date:   08/09/2016  . Comprehensive metabolic panel    Standing Status:   Future    Standing Expiration Date:   08/09/2016  . Lactate dehydrogenase    Standing Status:   Future    Standing Expiration Date:   08/09/2016   All questions were answered. The patient knows to call the clinic with any problems, questions or concerns.    Faythe Casa, NP  Jacquelin Hawking, NP 02/10/2016 4:57 PM

## 2016-02-10 NOTE — Progress Notes (Signed)
Patient here today for follow up.  Patient c/o cramps in her fingers  

## 2016-02-11 ENCOUNTER — Ambulatory Visit
Admission: RE | Admit: 2016-02-11 | Discharge: 2016-02-11 | Disposition: A | Discharge status: Home / Self Care | Payer: Medicare Other | Source: Ambulatory Visit | Attending: Internal Medicine | Admitting: Internal Medicine

## 2016-02-11 DIAGNOSIS — I700 Atherosclerosis of aorta: Secondary | ICD-10-CM | POA: Diagnosis not present

## 2016-02-11 DIAGNOSIS — K8020 Calculus of gallbladder without cholecystitis without obstruction: Secondary | ICD-10-CM | POA: Insufficient documentation

## 2016-02-11 DIAGNOSIS — C9110 Chronic lymphocytic leukemia of B-cell type not having achieved remission: Secondary | ICD-10-CM | POA: Insufficient documentation

## 2016-02-11 MED ORDER — IOPAMIDOL (ISOVUE-300) INJECTION 61%
100.0000 mL | Freq: Once | INTRAVENOUS | Status: AC | PRN
  Administered 2016-02-11: 100 mL via INTRAVENOUS

## 2016-03-09 ENCOUNTER — Inpatient Hospital Stay: Payer: Medicare Other

## 2016-03-09 ENCOUNTER — Inpatient Hospital Stay: Payer: Medicare Other | Attending: Internal Medicine | Admitting: Internal Medicine

## 2016-03-09 VITALS — BP 150/55 | HR 99 | Temp 99.4°F | Wt 158.1 lb

## 2016-03-09 DIAGNOSIS — D696 Thrombocytopenia, unspecified: Secondary | ICD-10-CM | POA: Diagnosis not present

## 2016-03-09 DIAGNOSIS — C9110 Chronic lymphocytic leukemia of B-cell type not having achieved remission: Secondary | ICD-10-CM

## 2016-03-09 DIAGNOSIS — E785 Hyperlipidemia, unspecified: Secondary | ICD-10-CM | POA: Diagnosis not present

## 2016-03-09 DIAGNOSIS — E039 Hypothyroidism, unspecified: Secondary | ICD-10-CM | POA: Diagnosis not present

## 2016-03-09 DIAGNOSIS — D649 Anemia, unspecified: Secondary | ICD-10-CM | POA: Diagnosis not present

## 2016-03-09 DIAGNOSIS — Z923 Personal history of irradiation: Secondary | ICD-10-CM | POA: Diagnosis not present

## 2016-03-09 DIAGNOSIS — Z9221 Personal history of antineoplastic chemotherapy: Secondary | ICD-10-CM

## 2016-03-09 DIAGNOSIS — I10 Essential (primary) hypertension: Secondary | ICD-10-CM | POA: Insufficient documentation

## 2016-03-09 DIAGNOSIS — R002 Palpitations: Secondary | ICD-10-CM

## 2016-03-09 DIAGNOSIS — Z79899 Other long term (current) drug therapy: Secondary | ICD-10-CM | POA: Insufficient documentation

## 2016-03-09 DIAGNOSIS — B0229 Other postherpetic nervous system involvement: Secondary | ICD-10-CM | POA: Diagnosis not present

## 2016-03-09 LAB — COMPREHENSIVE METABOLIC PANEL
ALBUMIN: 4.6 g/dL (ref 3.5–5.0)
ALK PHOS: 56 U/L (ref 38–126)
ALT: 14 U/L (ref 14–54)
AST: 22 U/L (ref 15–41)
Anion gap: 8 (ref 5–15)
BILIRUBIN TOTAL: 1.5 mg/dL — AB (ref 0.3–1.2)
BUN: 14 mg/dL (ref 6–20)
CALCIUM: 9.8 mg/dL (ref 8.9–10.3)
CO2: 23 mmol/L (ref 22–32)
Chloride: 104 mmol/L (ref 101–111)
Creatinine, Ser: 1.05 mg/dL — ABNORMAL HIGH (ref 0.44–1.00)
GFR calc Af Amer: 55 mL/min — ABNORMAL LOW (ref >60)
GFR calc non Af Amer: 47 mL/min — ABNORMAL LOW (ref >60)
GLUCOSE: 108 mg/dL — AB (ref 65–99)
Potassium: 3.7 mmol/L (ref 3.5–5.1)
Sodium: 135 mmol/L (ref 135–145)
TOTAL PROTEIN: 7.3 g/dL (ref 6.5–8.1)

## 2016-03-09 LAB — CBC WITH DIFFERENTIAL/PLATELET
BASOS PCT: 1 %
Basophils Absolute: 0.1 10*3/uL (ref 0–0.1)
Eosinophils Absolute: 0.1 10*3/uL (ref 0–0.7)
Eosinophils Relative: 2 %
HEMATOCRIT: 42.5 % (ref 35.0–47.0)
Hemoglobin: 14.7 g/dL (ref 12.0–16.0)
Lymphocytes Relative: 57 %
Lymphs Abs: 2.4 10*3/uL (ref 1.0–3.6)
MCH: 29 pg (ref 26.0–34.0)
MCHC: 34.6 g/dL (ref 32.0–36.0)
MCV: 83.7 fL (ref 80.0–100.0)
MONOS PCT: 11 %
Monocytes Absolute: 0.5 10*3/uL (ref 0.2–0.9)
NEUTROS ABS: 1.2 10*3/uL — AB (ref 1.4–6.5)
NEUTROS PCT: 29 %
Platelets: 123 10*3/uL — ABNORMAL LOW (ref 150–440)
RBC: 5.08 MIL/uL (ref 3.80–5.20)
RDW: 13.7 % (ref 11.5–14.5)
WBC: 4.2 10*3/uL (ref 3.6–11.0)

## 2016-03-09 LAB — LACTATE DEHYDROGENASE: LDH: 315 U/L — ABNORMAL HIGH (ref 98–192)

## 2016-03-09 NOTE — Progress Notes (Signed)
[place] OFFICE PROGRESS NOTE  Patient Care Team: Albina Billet, MD as PCP - General (Internal Medicine)  No matching staging information was found for the patient.   Oncology History   1. Ultrasound revealed abdominal lymphadenopathy(March, 2013) 2. Biopsy as well as peripheral blood be suggestive of chronic lymphocytic leukemia (April, 2013) 3. Started on bendamustin and Rituxan from May of 2013 4. Has finished her 6 cycles of chemotherapy with Rituxan and bendamustine (October 26, 2011) 5.recurrent and progressive disease with progressive anemia.  Patient was started on IBRUTANIB (March, 2016)  taking 140 mg 3 pills a day  6. Patient was taken off IBRUTANIB in August of 2016 because of persistent myelosuppression even with reducing dose 7. Because of rising WBC count and anemia patient would be started back on a lower dose of improved on ibrutanib 140 mg 2 tablets a day     Chronic lymphocytic leukemia ([place])   06/06/2014 Initial Diagnosis    Chronic lymphocytic leukemia        INTERVAL HISTORY:  Heather [name] 83 y.o.  female pleasant patient with above history of CLL currently on ibrutinib since March 2016 here for follow-up.  Patient states that chronic mild swelling in the legs is slightly improved since last visit, overall greatly improved. No significant diarrhea. Occasional dizziness, typically in the morning; as needed meclizine. No falls. Night sweats approximately once a week, causing wet night gown. No fever or chills. No unusual weight loss. No rashes, itching, lumps, or bumps.   Cramping in bilateral hands/fingers that began several weeks ago is currently resolved. No numbness or tingling in fingers or toes. Patient reports chest pressure and palpitations one month ago, resolved with belching.  She denies any chest pain or shortness of breath.  No nausea no vomiting. No unusual bruising. No spontaneous ecchymosis.    Patient reports post-shingles  neuralgia/pain in right upper arm, being followed by PCP.  REVIEW OF SYSTEMS:  A complete 10 point review of system is done which is negative except mentioned above/history of present illness.   PAST MEDICAL HISTORY :  Past Medical History:  Diagnosis Date  . Anemia   . Cataracts, bilateral   . Chronic lymphocytic leukemia ([place]) 06/06/2014  . Hearing loss   . History of chemotherapy   . Hyperlipidemia   . Hypertension   . Hypothyroidism   . Palpitations   . Risk for falls   . Thrombocytopenia ([place])     PAST SURGICAL HISTORY :   Past Surgical History:  Procedure Laterality Date  . CATARACT EXTRACTION Left 03/29/2012  . Excision left cervical node biopsy  05/04/2011    FAMILY HISTORY :  No family history on file.  SOCIAL HISTORY:   Social History  Substance Use Topics  . Smoking status: Never Smoker  . Smokeless tobacco: Never Used  . Alcohol use No    ALLERGIES:  has No Known Allergies.  MEDICATIONS:  Current Outpatient Prescriptions  Medication Sig Dispense Refill  . hydrALAZINE (APRESOLINE) 25 MG tablet Take 25 mg by mouth daily.    Marland Kitchen levothyroxine (SYNTHROID, LEVOTHROID) 75 MCG tablet Take 75 mcg by mouth daily before breakfast.     . losartan (COZAAR) 100 MG tablet Take 100 mg by mouth daily.    . meclizine (ANTIVERT) 12.5 MG tablet Take 1 tablet (12.5 mg total) by mouth at bedtime as needed. 30 tablet 3  . ibrutinib (IMBRUVICA) 140 MG capsul Take 2 capsules (280 mg total) by mouth daily. (  Patient not taking: Reported on 03/09/2016) 60 capsule 3   No current facility-administered medications for this visit.     PHYSICAL EXAMINATION: ECOG PERFORMANCE STATUS: 1 - Symptomatic but completely ambulatory  BP (!) 150/55 (BP Location: Left Arm, Patient Position: Sitting)   Pulse 99   Temp 99.4 F (37.4 C) (Tympanic)   Wt 158 lb 2 oz (71.7 kg)   BMI 28.92 kg/m   Filed Weights   03/09/16 1144  Weight: 158 lb 2 oz (71.7 kg)    GENERAL: Well-nourished  well-developed; Alert, no distress and comfortable.  Walks with a rolling walker. She is accompanied by her sister. EYES: no pallor or icterus OROPHARYNX: no thrush or ulceration;  NECK: supple, no masses felt LYMPH:  no palpable lymphadenopathy in the cervical, axillary or inguinal regions LUNGS: clear to auscultation and  No wheeze or crackles HEART/CVS: regular rate & rhythm and no murmurs; Bilateral 2+ lower extremity edema ABDOMEN:abdomen soft, non-tender and normal bowel sounds Musculoskeletal:no cyanosis of digits and no clubbing  PSYCH: alert & oriented x 3 with fluent speech NEURO: no focal motor/sensory deficits SKIN: no lesions noted.   LABORATORY DATA:  I have reviewed the data as listed    Component Value Date/Time   NA 135 03/09/2016 1110   NA 133 (L) 05/08/2014 1349   K 3.7 03/09/2016 1110   K 3.6 05/08/2014 1349   CL 104 03/09/2016 1110   CL 102 05/08/2014 1349   CO2 23 03/09/2016 1110   CO2 26 05/08/2014 1349   GLUCOSE 108 (H) 03/09/2016 1110   GLUCOSE 82 05/08/2014 1349   BUN 14 03/09/2016 1110   BUN 18 05/08/2014 1349   CREATININE 1.05 (H) 03/09/2016 1110   CREATININE 1.13 (H) 05/08/2014 1349   CALCIUM 9.8 03/09/2016 1110   CALCIUM 9.1 05/08/2014 1349   PROT 7.3 03/09/2016 1110   PROT 6.7 05/08/2014 1349   ALBUMIN 4.6 03/09/2016 1110   ALBUMIN 4.2 05/08/2014 1349   AST 22 03/09/2016 1110   AST 24 05/08/2014 1349   ALT 14 03/09/2016 1110   ALT 13 (L) 05/08/2014 1349   ALKPHOS 56 03/09/2016 1110   ALKPHOS 41 05/08/2014 1349   BILITOT 1.5 (H) 03/09/2016 1110   BILITOT 1.3 (H) 05/08/2014 1349   GFRNONAA 47 (L) 03/09/2016 1110   GFRNONAA 45 (L) 05/08/2014 1349   GFRAA 55 (L) 03/09/2016 1110   GFRAA 52 (L) 05/08/2014 1349    No results found for: SPEP, UPEP  Lab Results  Component Value Date   WBC 4.2 03/09/2016   NEUTROABS 1.2 (L) 03/09/2016   HGB 14.7 03/09/2016   HCT 42.5 03/09/2016   MCV 83.7 03/09/2016   PLT 123 (L) 03/09/2016       Chemistry      Component Value Date/Time   NA 135 03/09/2016 1110   NA 133 (L) 05/08/2014 1349   K 3.7 03/09/2016 1110   K 3.6 05/08/2014 1349   CL 104 03/09/2016 1110   CL 102 05/08/2014 1349   CO2 23 03/09/2016 1110   CO2 26 05/08/2014 1349   BUN 14 03/09/2016 1110   BUN 18 05/08/2014 1349   CREATININE 1.05 (H) 03/09/2016 1110   CREATININE 1.13 (H) 05/08/2014 1349      Component Value Date/Time   CALCIUM 9.8 03/09/2016 1110   CALCIUM 9.1 05/08/2014 1349   ALKPHOS 56 03/09/2016 1110   ALKPHOS 41 05/08/2014 1349   AST 22 03/09/2016 1110   AST 24 05/08/2014 1349   ALT  14 03/09/2016 1110   ALT 13 (L) 05/08/2014 1349   BILITOT 1.5 (H) 03/09/2016 1110   BILITOT 1.3 (H) 05/08/2014 1349       RADIOGRAPHIC STUDIES: I have personally reviewed the radiological images as listed and agreed with the findings in the report. No results found.   ASSESSMENT & PLAN:  Chronic lymphocytic leukemia ([place]) CLL on ibrutinib- since March 2016, previously on 2 pills secondary to intolerance. Stopped imbruvica for one month on 02/10/16 due to white count of 2.6, platelets 95, normal hemoglobin. CT scan on 02/11/16 shows improvement/near complete resolution of axillary, mediastinal, and mesenteric adenopathy. Increase in size of two adjacent lesions in spleen, concerning for possibility of lymph involvement. Blood counts improved- except LDH slightly elevated. Recommend re-starting Ibrutinib 2 pills a day.   # Bil ankle swelling-Resolved- Continue cozaar.   # Cramping:  Resolved. Continue eating foods rich in potassium. K 3.7 today.  # Elevated blood pressure: Being followed by PCP.  % Post-herpetic neuralgia: Being followed by PCP.  # Follow-up in 4 weeks with CBC/CMP/LDH  I personally interviewed and examined the patient. Agreed with the above plan of care. Patient/family questions were answered. Dr.Brahmanday MD    Orders Placed This Encounter  Procedures  . CBC with Differential     Standing Status:   Future    Standing Expiration Date:   03/09/2017  . Comprehensive metabolic panel    Standing Status:   Future    Standing Expiration Date:   03/09/2017  . Lactate dehydrogenase    Standing Status:   Future    Standing Expiration Date:   03/09/2017   All questions were answered. The patient knows to call the clinic with any problems, questions or concerns.   Lucendia Herrlich, NP  03/09/16 4:18 PM

## 2016-03-09 NOTE — Assessment & Plan Note (Addendum)
CLL on ibrutinib- since March 2016, previously on 2 pills secondary to intolerance. Stopped imbruvica for one month on 02/10/16 due to white count of 2.6, platelets 95, normal hemoglobin. CT scan on 02/11/16 shows improvement/near complete resolution of axillary, mediastinal, and mesenteric adenopathy. Increase in size of two adjacent lesions in spleen, concerning for possibility of lymph involvement. Blood counts improved- except LDH slightly elevated. Recommend re-starting Ibrutinib 2 pills a day.   # Bil ankle swelling-Resolved- Continue cozaar.   # Cramping:  Resolved. Continue eating foods rich in potassium. K 3.7 today.  # Elevated blood pressure: Being followed by PCP.  % Post-herpetic neuralgia: Being followed by PCP.  # Follow-up in 4 weeks with CBC/CMP/LDH  I personally interviewed and examined the patient. Agreed with the above plan of care. Patient/family questions were answered. Dr.Brahmanday MD

## 2016-03-09 NOTE — Progress Notes (Signed)
Patient here today for follow up.  Patient states no new concerns today  

## 2016-04-06 ENCOUNTER — Inpatient Hospital Stay (HOSPITAL_BASED_OUTPATIENT_CLINIC_OR_DEPARTMENT_OTHER): Payer: Medicare Other | Admitting: Internal Medicine

## 2016-04-06 ENCOUNTER — Inpatient Hospital Stay: Payer: Medicare Other | Attending: Internal Medicine

## 2016-04-06 VITALS — BP 173/67 | HR 79 | Temp 97.6°F | Resp 20 | Ht 62.0 in | Wt 157.4 lb

## 2016-04-06 DIAGNOSIS — D649 Anemia, unspecified: Secondary | ICD-10-CM | POA: Insufficient documentation

## 2016-04-06 DIAGNOSIS — E785 Hyperlipidemia, unspecified: Secondary | ICD-10-CM

## 2016-04-06 DIAGNOSIS — M7989 Other specified soft tissue disorders: Secondary | ICD-10-CM

## 2016-04-06 DIAGNOSIS — Z79899 Other long term (current) drug therapy: Secondary | ICD-10-CM

## 2016-04-06 DIAGNOSIS — D696 Thrombocytopenia, unspecified: Secondary | ICD-10-CM | POA: Insufficient documentation

## 2016-04-06 DIAGNOSIS — C9110 Chronic lymphocytic leukemia of B-cell type not having achieved remission: Secondary | ICD-10-CM | POA: Diagnosis not present

## 2016-04-06 DIAGNOSIS — I10 Essential (primary) hypertension: Secondary | ICD-10-CM

## 2016-04-06 DIAGNOSIS — E039 Hypothyroidism, unspecified: Secondary | ICD-10-CM

## 2016-04-06 LAB — CBC WITH DIFFERENTIAL/PLATELET
Basophils Absolute: 0 10*3/uL (ref 0–0.1)
Basophils Relative: 1 %
EOS PCT: 0 %
Eosinophils Absolute: 0 10*3/uL (ref 0–0.7)
HEMATOCRIT: 43.3 % (ref 35.0–47.0)
Hemoglobin: 14.8 g/dL (ref 12.0–16.0)
LYMPHS ABS: 1.9 10*3/uL (ref 1.0–3.6)
LYMPHS PCT: 54 %
MCH: 29.2 pg (ref 26.0–34.0)
MCHC: 34.3 g/dL (ref 32.0–36.0)
MCV: 85.2 fL (ref 80.0–100.0)
Monocytes Absolute: 0.3 10*3/uL (ref 0.2–0.9)
Monocytes Relative: 8 %
NEUTROS ABS: 1.3 10*3/uL — AB (ref 1.4–6.5)
Neutrophils Relative %: 37 %
PLATELETS: 111 10*3/uL — AB (ref 150–440)
RBC: 5.08 MIL/uL (ref 3.80–5.20)
RDW: 14.8 % — ABNORMAL HIGH (ref 11.5–14.5)
WBC: 3.5 10*3/uL — AB (ref 3.6–11.0)

## 2016-04-06 LAB — COMPREHENSIVE METABOLIC PANEL
ALK PHOS: 49 U/L (ref 38–126)
ALT: 16 U/L (ref 14–54)
AST: 27 U/L (ref 15–41)
Albumin: 4.6 g/dL (ref 3.5–5.0)
Anion gap: 7 (ref 5–15)
BILIRUBIN TOTAL: 1.3 mg/dL — AB (ref 0.3–1.2)
BUN: 17 mg/dL (ref 6–20)
CALCIUM: 9.3 mg/dL (ref 8.9–10.3)
CHLORIDE: 104 mmol/L (ref 101–111)
CO2: 25 mmol/L (ref 22–32)
CREATININE: 0.93 mg/dL (ref 0.44–1.00)
GFR, EST NON AFRICAN AMERICAN: 54 mL/min — AB (ref >60)
Glucose, Bld: 90 mg/dL (ref 65–99)
Potassium: 3.7 mmol/L (ref 3.5–5.1)
Sodium: 136 mmol/L (ref 135–145)
TOTAL PROTEIN: 7.4 g/dL (ref 6.5–8.1)

## 2016-04-06 LAB — LACTATE DEHYDROGENASE: LDH: 218 U/L — AB (ref 98–192)

## 2016-04-06 MED ORDER — HYDRALAZINE HCL 25 MG PO TABS: 25.0000 mg | ORAL_TABLET | Freq: Two times a day (BID) | ORAL | 4 refills | Status: DC

## 2016-04-06 NOTE — Assessment & Plan Note (Addendum)
CLL on ibrutinib- since March 2016, previously on 2 pills secondary to intolerance. CT scan on 02/11/16 shows improvement/near complete resolution of axillary, mediastinal, and mesenteric adenopathy.   # Continue Ibrutinib 2 pills a day. Tolerating better- platelets slightly low at 1 11,000. Asymptomatic.  # Bil ankle swelling chronic/ improved.    # Elevated blood pressure: systolic 563S/ still elevated; increase the frequency of hydralazine to 25 mg BID. Pt appt with Dr.Tate in apx 10 days. New scrip given.   # Follow-up in 8 weeks with CBC/CMP/LDH.   CC: Dr.Tate.

## 2016-04-06 NOTE — Progress Notes (Signed)
[facility] OFFICE PROGRESS NOTE  Patient Care Team: Albina Billet, MD as PCP - General (Internal Medicine)  No matching staging information was found for the patient.   Oncology History   1. Ultrasound revealed abdominal lymphadenopathy(March, 2013) 2. Biopsy as well as peripheral blood be suggestive of chronic lymphocytic leukemia (April, 2013) 3. Started on bendamustin and Rituxan from May of 2013 4. Has finished her 6 cycles of chemotherapy with Rituxan and bendamustine (October 26, 2011) 5.recurrent and progressive disease with progressive anemia.  Patient was started on IBRUTANIB (March, 2016)  taking 140 mg 3 pills a day  6. Patient was taken off IBRUTANIB in August of 2016 because of persistent myelosuppression even with reducing dose 7. Because of rising WBC count and anemia patient would be started back on a lower dose of improved on ibrutanib 140 mg 2 tablets a day     Chronic lymphocytic leukemia ([place])   06/06/2014 Initial Diagnosis    Chronic lymphocytic leukemia        INTERVAL HISTORY:  Heather [name] 83 y.o.  female pleasant patient above history of CLL currently on ibrutinib since March 2016 is here for follow-up.  Overall she is doing well. Denies any headaches. Denies any unusual shortness of breath or cough.  No unusual weight loss or night sweats. No lumps or bumps. Denies any dizzy spells or falls. No nausea no vomiting. Mild easy bruising. No spontaneous ecchymosis.    REVIEW OF SYSTEMS:  A complete 10 point review of system is done which is negative except mentioned above/history of present illness.   PAST MEDICAL HISTORY :  Past Medical History:  Diagnosis Date  . Anemia   . Cataracts, bilateral   . Chronic lymphocytic leukemia ([place]) 06/06/2014  . Hearing loss   . History of chemotherapy   . Hyperlipidemia   . Hypertension   . Hypothyroidism   . Palpitations   . Risk for falls   . Thrombocytopenia ([place])     PAST SURGICAL  HISTORY :   Past Surgical History:  Procedure Laterality Date  . CATARACT EXTRACTION Left 03/29/2012  . Excision left cervical node biopsy  05/04/2011    FAMILY HISTORY :  No family history on file.  SOCIAL HISTORY:   Social History  Substance Use Topics  . Smoking status: Never Smoker  . Smokeless tobacco: Never Used  . Alcohol use No    ALLERGIES:  has No Known Allergies.  MEDICATIONS:  Current Outpatient Prescriptions  Medication Sig Dispense Refill  . hydrALAZINE (APRESOLINE) 25 MG tablet Take 1 tablet (25 mg total) by mouth 2 (two) times daily. 60 tablet 4  . ibrutinib (IMBRUVICA) 140 MG capsul Take 2 capsules (280 mg total) by mouth daily. 60 capsule 3  . levothyroxine (SYNTHROID, LEVOTHROID) 75 MCG tablet Take 75 mcg by mouth daily before breakfast.     . losartan (COZAAR) 100 MG tablet Take 100 mg by mouth daily.    . meclizine (ANTIVERT) 12.5 MG tablet Take 1 tablet (12.5 mg total) by mouth at bedtime as needed. (Patient not taking: Reported on 04/06/2016) 30 tablet 3   No current facility-administered medications for this visit.     PHYSICAL EXAMINATION: ECOG PERFORMANCE STATUS: 1 - Symptomatic but completely ambulatory  BP (!) 173/67 (Patient Position: Sitting)   Pulse 79   Temp 97.6 F (36.4 C) (Tympanic)   Resp 20   Ht 5\' 2"  (1.575 m)   Wt 157 lb 6.4 oz (71.4 kg)  BMI 28.79 kg/m   Filed Weights   04/06/16 1125  Weight: 157 lb 6.4 oz (71.4 kg)    GENERAL: Well-nourished well-developed; Alert, no distress and comfortable.  Walks with a rolling walker. She is accompanied by her sister. EYES: no pallor or icterus OROPHARYNX: no thrush or ulceration;  NECK: supple, no masses felt LYMPH:  no palpable lymphadenopathy in the cervical, axillary or inguinal regions LUNGS: clear to auscultation and  No wheeze or crackles HEART/CVS: regular rate & rhythm and no murmurs; Bilateral 2+ lower extremity edema ABDOMEN:abdomen soft, non-tender and normal bowel  sounds Musculoskeletal:no cyanosis of digits and no clubbing  PSYCH: alert & oriented x 3 with fluent speech NEURO: no focal motor/sensory deficits SKIN: no lesions noted.   LABORATORY DATA:  I have reviewed the data as listed    Component Value Date/Time   NA 136 04/06/2016 1045   NA 133 (L) 05/08/2014 1349   K 3.7 04/06/2016 1045   K 3.6 05/08/2014 1349   CL 104 04/06/2016 1045   CL 102 05/08/2014 1349   CO2 25 04/06/2016 1045   CO2 26 05/08/2014 1349   GLUCOSE 90 04/06/2016 1045   GLUCOSE 82 05/08/2014 1349   BUN 17 04/06/2016 1045   BUN 18 05/08/2014 1349   CREATININE 0.93 04/06/2016 1045   CREATININE 1.13 (H) 05/08/2014 1349   CALCIUM 9.3 04/06/2016 1045   CALCIUM 9.1 05/08/2014 1349   PROT 7.4 04/06/2016 1045   PROT 6.7 05/08/2014 1349   ALBUMIN 4.6 04/06/2016 1045   ALBUMIN 4.2 05/08/2014 1349   AST 27 04/06/2016 1045   AST 24 05/08/2014 1349   ALT 16 04/06/2016 1045   ALT 13 (L) 05/08/2014 1349   ALKPHOS 49 04/06/2016 1045   ALKPHOS 41 05/08/2014 1349   BILITOT 1.3 (H) 04/06/2016 1045   BILITOT 1.3 (H) 05/08/2014 1349   GFRNONAA 54 (L) 04/06/2016 1045   GFRNONAA 45 (L) 05/08/2014 1349   GFRAA >60 04/06/2016 1045   GFRAA 52 (L) 05/08/2014 1349    No results found for: SPEP, UPEP  Lab Results  Component Value Date   WBC 3.5 (L) 04/06/2016   NEUTROABS 1.3 (L) 04/06/2016   HGB 14.8 04/06/2016   HCT 43.3 04/06/2016   MCV 85.2 04/06/2016   PLT 111 (L) 04/06/2016      Chemistry      Component Value Date/Time   NA 136 04/06/2016 1045   NA 133 (L) 05/08/2014 1349   K 3.7 04/06/2016 1045   K 3.6 05/08/2014 1349   CL 104 04/06/2016 1045   CL 102 05/08/2014 1349   CO2 25 04/06/2016 1045   CO2 26 05/08/2014 1349   BUN 17 04/06/2016 1045   BUN 18 05/08/2014 1349   CREATININE 0.93 04/06/2016 1045   CREATININE 1.13 (H) 05/08/2014 1349      Component Value Date/Time   CALCIUM 9.3 04/06/2016 1045   CALCIUM 9.1 05/08/2014 1349   ALKPHOS 49 04/06/2016  1045   ALKPHOS 41 05/08/2014 1349   AST 27 04/06/2016 1045   AST 24 05/08/2014 1349   ALT 16 04/06/2016 1045   ALT 13 (L) 05/08/2014 1349   BILITOT 1.3 (H) 04/06/2016 1045   BILITOT 1.3 (H) 05/08/2014 1349       RADIOGRAPHIC STUDIES: I have personally reviewed the radiological images as listed and agreed with the findings in the report. No results found.   ASSESSMENT & PLAN:  Chronic lymphocytic leukemia ([place]) CLL on ibrutinib- since March 2016, previously on  2 pills secondary to intolerance. CT scan on 02/11/16 shows improvement/near complete resolution of axillary, mediastinal, and mesenteric adenopathy.   # Continue Ibrutinib 2 pills a day. Tolerating better- platelets slightly low at 1 11,000. Asymptomatic.  # Bil ankle swelling chronic/ improved.    # Elevated blood pressure: systolic 536R/ still elevated; increase the frequency of hydralazine to 25 mg BID. Pt appt with Dr.Tate in apx 10 days. New scrip given.   # Follow-up in 8 weeks with CBC/CMP/LDH.   CC: Dr.Tate.    Orders Placed This Encounter  Procedures  . CBC with Differential/Platelet    Standing Status:   Future    Standing Expiration Date:   06/29/2016  . Comprehensive metabolic panel    Standing Status:   Future    Standing Expiration Date:   06/29/2016  . Lactate dehydrogenase    Standing Status:   Future    Standing Expiration Date:   06/29/2016   All questions were answered. The patient knows to call the clinic with any problems, questions or concerns.      Cammie Sickle, MD 04/06/2016 12:53 PM

## 2016-04-16 ENCOUNTER — Other Ambulatory Visit: Payer: Self-pay | Admitting: *Deleted

## 2016-04-16 DIAGNOSIS — C9110 Chronic lymphocytic leukemia of B-cell type not having achieved remission: Secondary | ICD-10-CM

## 2016-04-16 MED ORDER — IBRUTINIB 280 MG PO TABS: 1.0000 | ORAL_TABLET | Freq: Every day | ORAL | 6 refills | Status: DC

## 2016-05-01 ENCOUNTER — Telehealth: Payer: Self-pay | Admitting: *Deleted

## 2016-05-01 NOTE — Telephone Encounter (Signed)
Received vm today from [facility] at [facility] [phone]) stating that a PA is required for Imbruvica.  Requested for OV note to be faxed to them [phone].    Office visit note sent as requested.

## 2016-06-03 ENCOUNTER — Inpatient Hospital Stay (HOSPITAL_BASED_OUTPATIENT_CLINIC_OR_DEPARTMENT_OTHER): Payer: Medicare Other | Admitting: Internal Medicine

## 2016-06-03 ENCOUNTER — Inpatient Hospital Stay: Payer: Medicare Other | Attending: Internal Medicine

## 2016-06-03 VITALS — BP 179/76 | HR 84 | Temp 97.6°F | Resp 18 | Ht 62.0 in | Wt 168.0 lb

## 2016-06-03 DIAGNOSIS — D696 Thrombocytopenia, unspecified: Secondary | ICD-10-CM

## 2016-06-03 DIAGNOSIS — Z79899 Other long term (current) drug therapy: Secondary | ICD-10-CM

## 2016-06-03 DIAGNOSIS — E785 Hyperlipidemia, unspecified: Secondary | ICD-10-CM | POA: Diagnosis not present

## 2016-06-03 DIAGNOSIS — E039 Hypothyroidism, unspecified: Secondary | ICD-10-CM

## 2016-06-03 DIAGNOSIS — I10 Essential (primary) hypertension: Secondary | ICD-10-CM

## 2016-06-03 DIAGNOSIS — D649 Anemia, unspecified: Secondary | ICD-10-CM | POA: Insufficient documentation

## 2016-06-03 DIAGNOSIS — M7989 Other specified soft tissue disorders: Secondary | ICD-10-CM | POA: Insufficient documentation

## 2016-06-03 DIAGNOSIS — C9110 Chronic lymphocytic leukemia of B-cell type not having achieved remission: Secondary | ICD-10-CM | POA: Insufficient documentation

## 2016-06-03 LAB — COMPREHENSIVE METABOLIC PANEL
ALT: 17 U/L (ref 14–54)
AST: 27 U/L (ref 15–41)
Albumin: 4.4 g/dL (ref 3.5–5.0)
Alkaline Phosphatase: 47 U/L (ref 38–126)
Anion gap: 6 (ref 5–15)
BUN: 13 mg/dL (ref 6–20)
CALCIUM: 9.3 mg/dL (ref 8.9–10.3)
CHLORIDE: 105 mmol/L (ref 101–111)
CO2: 25 mmol/L (ref 22–32)
CREATININE: 0.9 mg/dL (ref 0.44–1.00)
GFR, EST NON AFRICAN AMERICAN: 56 mL/min — AB (ref >60)
Glucose, Bld: 93 mg/dL (ref 65–99)
Potassium: 3.7 mmol/L (ref 3.5–5.1)
SODIUM: 136 mmol/L (ref 135–145)
Total Bilirubin: 1.3 mg/dL — ABNORMAL HIGH (ref 0.3–1.2)
Total Protein: 6.9 g/dL (ref 6.5–8.1)

## 2016-06-03 LAB — CBC WITH DIFFERENTIAL/PLATELET
Basophils Absolute: 0 10*3/uL (ref 0–0.1)
Basophils Relative: 1 %
Eosinophils Absolute: 0 10*3/uL (ref 0–0.7)
Eosinophils Relative: 1 %
HCT: 43 % (ref 35.0–47.0)
Hemoglobin: 14.8 g/dL (ref 12.0–16.0)
Lymphocytes Relative: 56 %
Lymphs Abs: 2.4 10*3/uL (ref 1.0–3.6)
MCH: 29.5 pg (ref 26.0–34.0)
MCHC: 34.4 g/dL (ref 32.0–36.0)
MCV: 85.7 fL (ref 80.0–100.0)
Monocytes Absolute: 0.4 10*3/uL (ref 0.2–0.9)
Monocytes Relative: 10 %
Neutro Abs: 1.4 10*3/uL (ref 1.4–6.5)
Neutrophils Relative %: 32 %
Platelets: 112 10*3/uL — ABNORMAL LOW (ref 150–440)
RBC: 5.02 MIL/uL (ref 3.80–5.20)
RDW: 13.6 % (ref 11.5–14.5)
WBC: 4.3 10*3/uL (ref 3.6–11.0)

## 2016-06-03 LAB — LACTATE DEHYDROGENASE: LDH: 223 U/L — ABNORMAL HIGH (ref 98–192)

## 2016-06-03 MED ORDER — HYDROCHLOROTHIAZIDE 12.5 MG PO CAPS: 12.5000 mg | ORAL_CAPSULE | Freq: Every day | ORAL | 4 refills | Status: DC

## 2016-06-03 NOTE — Progress Notes (Signed)
[facility] OFFICE PROGRESS NOTE  Patient Care Team: Albina Billet, MD as PCP - General (Internal Medicine)  No matching staging information was found for the patient.   Oncology History   1. Ultrasound revealed abdominal lymphadenopathy(March, 2013) 2. Biopsy as well as peripheral blood be suggestive of chronic lymphocytic leukemia (April, 2013) 3. Started on bendamustin and Rituxan from May of 2013 4. Has finished her 6 cycles of chemotherapy with Rituxan and bendamustine (October 26, 2011) 5.recurrent and progressive disease with progressive anemia.  Patient was started on IBRUTANIB (March, 2016)  taking 140 mg 3 pills a day  6. Patient was taken off IBRUTANIB in August of 2016 because of persistent myelosuppression even with reducing dose 7. Because of rising WBC count and anemia patient would be started back on a lower dose of improved on ibrutanib 140 mg 2 tablets a day     Chronic lymphocytic leukemia ([place])   06/06/2014 Initial Diagnosis    Chronic lymphocytic leukemia        INTERVAL HISTORY:  Heather [name] 83 y.o.  female pleasant patient above history of CLL currently on ibrutinib since March 2016 is here for follow-up.  Patient continues to complain of bilateral leg swelling. Denies any headaches. Denies any unusual shortness of breath or cough.  No unusual weight loss or night sweats. No lumps or bumps. Denies any dizzy spells or falls. No nausea no vomiting. Mild easy bruising. No spontaneous ecchymosis.    REVIEW OF SYSTEMS:  A complete 10 point review of system is done which is negative except mentioned above/history of present illness.   PAST MEDICAL HISTORY :  Past Medical History:  Diagnosis Date  . Anemia   . Cataracts, bilateral   . Chronic lymphocytic leukemia ([place]) 06/06/2014  . Hearing loss   . History of chemotherapy   . Hyperlipidemia   . Hypertension   . Hypothyroidism   . Palpitations   . Risk for falls   . Thrombocytopenia  ([place])     PAST SURGICAL HISTORY :   Past Surgical History:  Procedure Laterality Date  . CATARACT EXTRACTION Left 03/29/2012  . Excision left cervical node biopsy  05/04/2011    FAMILY HISTORY :  No family history on file.  SOCIAL HISTORY:   Social History  Substance Use Topics  . Smoking status: Never Smoker  . Smokeless tobacco: Never Used  . Alcohol use No    ALLERGIES:  has No Known Allergies.  MEDICATIONS:  Current Outpatient Prescriptions  Medication Sig Dispense Refill  . hydrALAZINE (APRESOLINE) 25 MG tablet Take 1 tablet (25 mg total) by mouth 2 (two) times daily. 60 tablet 4  . Ibrutinib (IMBRUVICA) 280 MG TABS Take 1 tablet by mouth daily. 30 tablet 6  . levothyroxine (SYNTHROID, LEVOTHROID) 75 MCG tablet Take 75 mcg by mouth daily before breakfast.     . losartan (COZAAR) 100 MG tablet Take 100 mg by mouth daily.    . hydrochlorothiazide (MICROZIDE) 12.5 MG capsule Take 1 capsule (12.5 mg total) by mouth daily. 30 capsule 4  . meclizine (ANTIVERT) 12.5 MG tablet Take 1 tablet (12.5 mg total) by mouth at bedtime as needed. (Patient not taking: Reported on 04/06/2016) 30 tablet 3   No current facility-administered medications for this visit.     PHYSICAL EXAMINATION: ECOG PERFORMANCE STATUS: 1 - Symptomatic but completely ambulatory  BP (!) 179/76 (BP Location: Left Arm, Patient Position: Sitting)   Pulse 84   Temp 97.6 F (36.4  C) (Tympanic)   Resp 18   Ht 5\' 2"  (1.575 m)   Wt 168 lb (76.2 kg)   BMI 30.73 kg/m    Filed Weights   06/03/16 1419  Weight: 168 lb (76.2 kg)    GENERAL: Well-nourished well-developed; Alert, no distress and comfortable.  Walks with a rolling walker. She is accompanied by her sister. EYES: no pallor or icterus OROPHARYNX: no thrush or ulceration;  NECK: supple, no masses felt LYMPH:  no palpable lymphadenopathy in the cervical, axillary or inguinal regions LUNGS: clear to auscultation and  No wheeze or crackles HEART/CVS:  regular rate & rhythm and no murmurs; Bilateral 2+ lower extremity edema ABDOMEN:abdomen soft, non-tender and normal bowel sounds Musculoskeletal:no cyanosis of digits and no clubbing  PSYCH: alert & oriented x 3 with fluent speech NEURO: no focal motor/sensory deficits SKIN: no lesions noted.   LABORATORY DATA:  I have reviewed the data as listed    Component Value Date/Time   NA 136 06/03/2016 1353   NA 133 (L) 05/08/2014 1349   K 3.7 06/03/2016 1353   K 3.6 05/08/2014 1349   CL 105 06/03/2016 1353   CL 102 05/08/2014 1349   CO2 25 06/03/2016 1353   CO2 26 05/08/2014 1349   GLUCOSE 93 06/03/2016 1353   GLUCOSE 82 05/08/2014 1349   BUN 13 06/03/2016 1353   BUN 18 05/08/2014 1349   CREATININE 0.90 06/03/2016 1353   CREATININE 1.13 (H) 05/08/2014 1349   CALCIUM 9.3 06/03/2016 1353   CALCIUM 9.1 05/08/2014 1349   PROT 6.9 06/03/2016 1353   PROT 6.7 05/08/2014 1349   ALBUMIN 4.4 06/03/2016 1353   ALBUMIN 4.2 05/08/2014 1349   AST 27 06/03/2016 1353   AST 24 05/08/2014 1349   ALT 17 06/03/2016 1353   ALT 13 (L) 05/08/2014 1349   ALKPHOS 47 06/03/2016 1353   ALKPHOS 41 05/08/2014 1349   BILITOT 1.3 (H) 06/03/2016 1353   BILITOT 1.3 (H) 05/08/2014 1349   GFRNONAA 56 (L) 06/03/2016 1353   GFRNONAA 45 (L) 05/08/2014 1349   GFRAA >60 06/03/2016 1353   GFRAA 52 (L) 05/08/2014 1349    No results found for: SPEP, UPEP  Lab Results  Component Value Date   WBC 4.3 06/03/2016   NEUTROABS 1.4 06/03/2016   HGB 14.8 06/03/2016   HCT 43.0 06/03/2016   MCV 85.7 06/03/2016   PLT 112 (L) 06/03/2016      Chemistry      Component Value Date/Time   NA 136 06/03/2016 1353   NA 133 (L) 05/08/2014 1349   K 3.7 06/03/2016 1353   K 3.6 05/08/2014 1349   CL 105 06/03/2016 1353   CL 102 05/08/2014 1349   CO2 25 06/03/2016 1353   CO2 26 05/08/2014 1349   BUN 13 06/03/2016 1353   BUN 18 05/08/2014 1349   CREATININE 0.90 06/03/2016 1353   CREATININE 1.13 (H) 05/08/2014 1349       Component Value Date/Time   CALCIUM 9.3 06/03/2016 1353   CALCIUM 9.1 05/08/2014 1349   ALKPHOS 47 06/03/2016 1353   ALKPHOS 41 05/08/2014 1349   AST 27 06/03/2016 1353   AST 24 05/08/2014 1349   ALT 17 06/03/2016 1353   ALT 13 (L) 05/08/2014 1349   BILITOT 1.3 (H) 06/03/2016 1353   BILITOT 1.3 (H) 05/08/2014 1349       RADIOGRAPHIC STUDIES: I have personally reviewed the radiological images as listed and agreed with the findings in the report. No results  found.   ASSESSMENT & PLAN:  Chronic lymphocytic leukemia ([place]) CLL on ibrutinib- since March 2016, previously on 2 pills secondary to intolerance. CT scan on 02/11/16 shows improvement/near complete resolution of axillary, mediastinal, and mesenteric adenopathy.   # Continue Ibrutinib 2 pills a day. Tolerating better- platelets slightly low at 112,000. Asymptomatic. Thrombocytopenia likely secondary to ibrutinib.   # Bil ankle swelling chronic/stable- hydrochlorothiazide might help with fluid retention.    # Elevated blood pressure: systolic 371G/ still elevated; question related to ibrutinib. On hydralazine to 25 mg BID; Cozaar 100 mg a day. Add HCTZ- 12.5mg /day. New prescription given. Check bp at home; keep a log; and bring to PCP attention.   # Follow-up in 8 weeks with CBC/CMP/LDH.   CC: Dr.Tate.    Orders Placed This Encounter  Procedures  . CBC with Differential/Platelet    Standing Status:   Future    Standing Expiration Date:   06/03/2017  . Comprehensive metabolic panel    Standing Status:   Future    Standing Expiration Date:   06/03/2017   All questions were answered. The patient knows to call the clinic with any problems, questions or concerns.      Cammie Sickle, MD 06/07/2016 6:49 PM

## 2016-06-03 NOTE — Progress Notes (Signed)
RN Contacted [facility] on behalf of patient- to arrange for next shipment of imbruvica. Pt received letter in mail from [facility]-stating she doesn't understand why [facility] would be filling her rx of Imbruvica.  I explained to her that the letter was written to explain that her prescription received approval from insurance. I personally contacted [facility] to confirm that she can use [facility] as the dispensing pharmacy. Pt spoke with representative. New shipment of imbruivca will be received via [employer] on Monday 06/08/16

## 2016-06-03 NOTE — Assessment & Plan Note (Addendum)
CLL on ibrutinib- since March 2016, previously on 2 pills secondary to intolerance. CT scan on 02/11/16 shows improvement/near complete resolution of axillary, mediastinal, and mesenteric adenopathy.   # Continue Ibrutinib 2 pills a day. Tolerating better- platelets slightly low at 112,000. Asymptomatic. Thrombocytopenia likely secondary to ibrutinib.   # Bil ankle swelling chronic/stable- hydrochlorothiazide might help with fluid retention.    # Elevated blood pressure: systolic 176H/ still elevated; question related to ibrutinib. On hydralazine to 25 mg BID; Cozaar 100 mg a day. Add HCTZ- 12.5mg /day. New prescription given. Check bp at home; keep a log; and bring to PCP attention.   # Follow-up in 8 weeks with CBC/CMP/LDH.   CC: Dr.Tate.

## 2016-08-05 ENCOUNTER — Inpatient Hospital Stay: Payer: Medicare Other

## 2016-08-05 ENCOUNTER — Inpatient Hospital Stay: Payer: Medicare Other | Attending: Internal Medicine | Admitting: Internal Medicine

## 2016-08-05 DIAGNOSIS — C9110 Chronic lymphocytic leukemia of B-cell type not having achieved remission: Secondary | ICD-10-CM

## 2016-08-05 DIAGNOSIS — Z79899 Other long term (current) drug therapy: Secondary | ICD-10-CM

## 2016-08-05 DIAGNOSIS — Z9221 Personal history of antineoplastic chemotherapy: Secondary | ICD-10-CM | POA: Insufficient documentation

## 2016-08-05 DIAGNOSIS — R232 Flushing: Secondary | ICD-10-CM

## 2016-08-05 DIAGNOSIS — E785 Hyperlipidemia, unspecified: Secondary | ICD-10-CM | POA: Insufficient documentation

## 2016-08-05 DIAGNOSIS — D649 Anemia, unspecified: Secondary | ICD-10-CM | POA: Insufficient documentation

## 2016-08-05 DIAGNOSIS — E039 Hypothyroidism, unspecified: Secondary | ICD-10-CM | POA: Diagnosis not present

## 2016-08-05 DIAGNOSIS — D696 Thrombocytopenia, unspecified: Secondary | ICD-10-CM | POA: Diagnosis not present

## 2016-08-05 DIAGNOSIS — M549 Dorsalgia, unspecified: Secondary | ICD-10-CM | POA: Diagnosis not present

## 2016-08-05 DIAGNOSIS — I10 Essential (primary) hypertension: Secondary | ICD-10-CM | POA: Insufficient documentation

## 2016-08-05 DIAGNOSIS — M7989 Other specified soft tissue disorders: Secondary | ICD-10-CM | POA: Insufficient documentation

## 2016-08-05 DIAGNOSIS — C9190 Lymphoid leukemia, unspecified not having achieved remission: Secondary | ICD-10-CM | POA: Diagnosis present

## 2016-08-05 DIAGNOSIS — R252 Cramp and spasm: Secondary | ICD-10-CM | POA: Diagnosis not present

## 2016-08-05 LAB — COMPREHENSIVE METABOLIC PANEL
ALT: 20 U/L (ref 14–54)
ANION GAP: 8 (ref 5–15)
AST: 34 U/L (ref 15–41)
Albumin: 4.3 g/dL (ref 3.5–5.0)
Alkaline Phosphatase: 41 U/L (ref 38–126)
BUN: 15 mg/dL (ref 6–20)
CALCIUM: 9.3 mg/dL (ref 8.9–10.3)
CHLORIDE: 100 mmol/L — AB (ref 101–111)
CO2: 24 mmol/L (ref 22–32)
Creatinine, Ser: 1.03 mg/dL — ABNORMAL HIGH (ref 0.44–1.00)
GFR calc non Af Amer: 48 mL/min — ABNORMAL LOW (ref >60)
GFR, EST AFRICAN AMERICAN: 55 mL/min — AB (ref >60)
Glucose, Bld: 85 mg/dL (ref 65–99)
Potassium: 3.6 mmol/L (ref 3.5–5.1)
SODIUM: 132 mmol/L — AB (ref 135–145)
Total Bilirubin: 1.5 mg/dL — ABNORMAL HIGH (ref 0.3–1.2)
Total Protein: 6.9 g/dL (ref 6.5–8.1)

## 2016-08-05 LAB — CBC WITH DIFFERENTIAL/PLATELET
Basophils Absolute: 0.1 10*3/uL (ref 0–0.1)
Basophils Relative: 2 %
EOS PCT: 0 %
Eosinophils Absolute: 0 10*3/uL (ref 0–0.7)
HCT: 41 % (ref 35.0–47.0)
Hemoglobin: 14.1 g/dL (ref 12.0–16.0)
LYMPHS ABS: 2.4 10*3/uL (ref 1.0–3.6)
Lymphocytes Relative: 59 %
MCH: 29.7 pg (ref 26.0–34.0)
MCHC: 34.5 g/dL (ref 32.0–36.0)
MCV: 86.2 fL (ref 80.0–100.0)
MONOS PCT: 12 %
Monocytes Absolute: 0.5 10*3/uL (ref 0.2–0.9)
Neutro Abs: 1.1 10*3/uL — ABNORMAL LOW (ref 1.4–6.5)
Neutrophils Relative %: 27 %
PLATELETS: 112 10*3/uL — AB (ref 150–440)
RBC: 4.75 MIL/uL (ref 3.80–5.20)
RDW: 13.8 % (ref 11.5–14.5)
WBC: 4 10*3/uL (ref 3.6–11.0)

## 2016-08-05 NOTE — Assessment & Plan Note (Addendum)
CLL on ibrutinib- since March 2016, previously on 2 pills secondary to intolerance. CT scan on 02/11/16 shows improvement/near complete resolution of axillary, mediastinal, and mesenteric adenopathy.   # Currently on Ibrutinib 2 pills a day. Tolerating well but elevated Blood pressure [see discussion below] platelets slightly low at 112,000. Asymptomatic.   # Thrombocytopenia likely secondary to ibrutinib. Monitor for now.  # Bil ankle swelling chronic/stable-improved; continue hydrochlorothiazide.  # Elevated blood pressure: systolic 798V/ still elevated; question related to ibrutinib. systolics- [id number] at home. On hydralazine to 25 mg BID; Cozaar 100 mg a day. continue HCTZ- 12.5mg /day.    # back spasm- ? musclo-skeletal- on muscle relaxant/ mag ox [thru PCP].    # Follow-up in 2 months with CBC/CMP/LDH.   CC: Dr.Tate.

## 2016-08-05 NOTE — Progress Notes (Signed)
[place] OFFICE PROGRESS NOTE  Patient Care Team: Albina Billet, MD as PCP - General (Internal Medicine)  No matching staging information was found for the patient.   Oncology History   1. Ultrasound revealed abdominal lymphadenopathy(March, 2013) 2. Biopsy as well as peripheral blood be suggestive of chronic lymphocytic leukemia (April, 2013) 3. Started on bendamustin and Rituxan from May of 2013 4. Has finished her 6 cycles of chemotherapy with Rituxan and bendamustine (October 26, 2011) 5.recurrent and progressive disease with progressive anemia.  Patient was started on IBRUTANIB (March, 2016)  taking 140 mg 3 pills a day  6. Patient was taken off IBRUTANIB in August of 2016 because of persistent myelosuppression even with reducing dose 7. Because of rising WBC count and anemia patient would be started back on a lower dose of improved on ibrutanib 140 mg 2 tablets a day     Chronic lymphocytic leukemia ([place])   06/06/2014 Initial Diagnosis    Chronic lymphocytic leukemia        INTERVAL HISTORY:  Heather [name] 83 y.o.  female pleasant patient above history of CLL currently on ibrutinib since March 2016 is here for follow-up.  Patient noted to have mild left back pain- for which she has been evaluated by PCP; currently on muscle relaxant. Patient leg swelling is chronic. Not getting any worse. Denies any headaches. Denies any unusual shortness of breath or cough.  No unusual weight loss or night sweats. No lumps or bumps. Denies any dizzy spells or falls. No nausea no vomiting. Denies bleeding.  REVIEW OF SYSTEMS:  A complete 10 point review of system is done which is negative except mentioned above/history of present illness.   PAST MEDICAL HISTORY :  Past Medical History:  Diagnosis Date  . Anemia   . Cataracts, bilateral   . Chronic lymphocytic leukemia ([place]) 06/06/2014  . Hearing loss   . History of chemotherapy   . Hyperlipidemia   .  Hypertension   . Hypothyroidism   . Palpitations   . Risk for falls   . Thrombocytopenia ([place])     PAST SURGICAL HISTORY :   Past Surgical History:  Procedure Laterality Date  . CATARACT EXTRACTION Left 03/29/2012  . Excision left cervical node biopsy  05/04/2011    FAMILY HISTORY :  No family history on file.  SOCIAL HISTORY:   Social History  Substance Use Topics  . Smoking status: Never Smoker  . Smokeless tobacco: Never Used  . Alcohol use No    ALLERGIES:  has No Known Allergies.  MEDICATIONS:  Current Outpatient Prescriptions  Medication Sig Dispense Refill  . hydrALAZINE (APRESOLINE) 25 MG tablet Take 1 tablet (25 mg total) by mouth 2 (two) times daily. 60 tablet 4  . hydrochlorothiazide (MICROZIDE) 12.5 MG capsule Take 1 capsule (12.5 mg total) by mouth daily. 30 capsule 4  . Ibrutinib (IMBRUVICA) 280 MG TABS Take 1 tablet by mouth daily. 30 tablet 6  . levothyroxine (SYNTHROID, LEVOTHROID) 75 MCG tablet Take 75 mcg by mouth daily before breakfast.     . magnesium oxide (MAG-OX) 400 MG tablet Take 400 mg by mouth daily.    Marland Kitchen tiZANidine (ZANAFLEX) 2 MG tablet Take 2 mg by mouth 2 (two) times daily.    Marland Kitchen losartan (COZAAR) 100 MG tablet Take 100 mg by mouth daily.    . meclizine (ANTIVERT) 12.5 MG tablet Take 1 tablet (12.5 mg total) by mouth at bedtime as needed. (Patient not taking: Reported on  04/06/2016) 30 tablet 3   No current facility-administered medications for this visit.     PHYSICAL EXAMINATION: ECOG PERFORMANCE STATUS: 1 - Symptomatic but completely ambulatory  There were no vitals taken for this visit.  There were no vitals filed for this visit.  GENERAL: Well-nourished well-developed; Alert, no distress and comfortable.  Walks with a rolling walker. She is accompanied by her sister. EYES: no pallor or icterus OROPHARYNX: no thrush or ulceration;  NECK: supple, no masses felt LYMPH:  no palpable lymphadenopathy in the cervical, axillary or  inguinal regions LUNGS: clear to auscultation and  No wheeze or crackles HEART/CVS: regular rate & rhythm and no murmurs; Bilateral 2+ lower extremity edema ABDOMEN:abdomen soft, non-tender and normal bowel sounds Musculoskeletal:no cyanosis of digits and no clubbing  PSYCH: alert & oriented x 3 with fluent speech NEURO: no focal motor/sensory deficits SKIN: no lesions noted.   LABORATORY DATA:  I have reviewed the data as listed    Component Value Date/Time   NA 132 (L) 08/05/2016 1415   NA 133 (L) 05/08/2014 1349   K 3.6 08/05/2016 1415   K 3.6 05/08/2014 1349   CL 100 (L) 08/05/2016 1415   CL 102 05/08/2014 1349   CO2 24 08/05/2016 1415   CO2 26 05/08/2014 1349   GLUCOSE 85 08/05/2016 1415   GLUCOSE 82 05/08/2014 1349   BUN 15 08/05/2016 1415   BUN 18 05/08/2014 1349   CREATININE 1.03 (H) 08/05/2016 1415   CREATININE 1.13 (H) 05/08/2014 1349   CALCIUM 9.3 08/05/2016 1415   CALCIUM 9.1 05/08/2014 1349   PROT 6.9 08/05/2016 1415   PROT 6.7 05/08/2014 1349   ALBUMIN 4.3 08/05/2016 1415   ALBUMIN 4.2 05/08/2014 1349   AST 34 08/05/2016 1415   AST 24 05/08/2014 1349   ALT 20 08/05/2016 1415   ALT 13 (L) 05/08/2014 1349   ALKPHOS 41 08/05/2016 1415   ALKPHOS 41 05/08/2014 1349   BILITOT 1.5 (H) 08/05/2016 1415   BILITOT 1.3 (H) 05/08/2014 1349   GFRNONAA 48 (L) 08/05/2016 1415   GFRNONAA 45 (L) 05/08/2014 1349   GFRAA 55 (L) 08/05/2016 1415   GFRAA 52 (L) 05/08/2014 1349    No results found for: SPEP, UPEP  Lab Results  Component Value Date   WBC 4.0 08/05/2016   NEUTROABS 1.1 (L) 08/05/2016   HGB 14.1 08/05/2016   HCT 41.0 08/05/2016   MCV 86.2 08/05/2016   PLT 112 (L) 08/05/2016      Chemistry      Component Value Date/Time   NA 132 (L) 08/05/2016 1415   NA 133 (L) 05/08/2014 1349   K 3.6 08/05/2016 1415   K 3.6 05/08/2014 1349   CL 100 (L) 08/05/2016 1415   CL 102 05/08/2014 1349   CO2 24 08/05/2016 1415   CO2 26 05/08/2014 1349   BUN 15  08/05/2016 1415   BUN 18 05/08/2014 1349   CREATININE 1.03 (H) 08/05/2016 1415   CREATININE 1.13 (H) 05/08/2014 1349      Component Value Date/Time   CALCIUM 9.3 08/05/2016 1415   CALCIUM 9.1 05/08/2014 1349   ALKPHOS 41 08/05/2016 1415   ALKPHOS 41 05/08/2014 1349   AST 34 08/05/2016 1415   AST 24 05/08/2014 1349   ALT 20 08/05/2016 1415   ALT 13 (L) 05/08/2014 1349   BILITOT 1.5 (H) 08/05/2016 1415   BILITOT 1.3 (H) 05/08/2014 1349       RADIOGRAPHIC STUDIES: I have personally reviewed the radiological images  as listed and agreed with the findings in the report. No results found.   ASSESSMENT & PLAN:  Chronic lymphocytic leukemia ([place]) CLL on ibrutinib- since March 2016, previously on 2 pills secondary to intolerance. CT scan on 02/11/16 shows improvement/near complete resolution of axillary, mediastinal, and mesenteric adenopathy.   # Currently on Ibrutinib 2 pills a day. Tolerating well but elevated Blood pressure [see discussion below] platelets slightly low at 112,000. Asymptomatic.   # Thrombocytopenia likely secondary to ibrutinib. Monitor for now.  # Bil ankle swelling chronic/stable-improved; continue hydrochlorothiazide.  # Elevated blood pressure: systolic 607P/ still elevated; question related to ibrutinib. systolics- [id number] at home. On hydralazine to 25 mg BID; Cozaar 100 mg a day. continue HCTZ- 12.5mg /day.    # back spasm- ? musclo-skeletal- on muscle relaxant/ mag ox [thru PCP].    # Follow-up in 2 months with CBC/CMP/LDH.   CC: Dr.Tate.    Orders Placed This Encounter  Procedures  . CBC with Differential/Platelet    Standing Status:   Future    Standing Expiration Date:   08/05/2017  . Comprehensive metabolic panel    Standing Status:   Future    Standing Expiration Date:   08/05/2017  . Lactate dehydrogenase    Standing Status:   Future    Standing Expiration Date:   08/05/2017   All questions were answered. The patient knows to call the clinic  with any problems, questions or concerns.      Cammie Sickle, MD 08/05/2016 5:20 PM

## 2016-08-05 NOTE — Progress Notes (Signed)
Patient is here today for a follow up. Patient has no new concerns today.  

## 2016-09-02 ENCOUNTER — Other Ambulatory Visit: Payer: Self-pay | Admitting: *Deleted

## 2016-09-02 MED ORDER — HYDROCHLOROTHIAZIDE 12.5 MG PO CAPS: 12.5000 mg | ORAL_CAPSULE | Freq: Every day | ORAL | 4 refills | Status: DC

## 2016-10-07 ENCOUNTER — Inpatient Hospital Stay: Payer: Medicare Other | Attending: Internal Medicine

## 2016-10-07 ENCOUNTER — Inpatient Hospital Stay (HOSPITAL_BASED_OUTPATIENT_CLINIC_OR_DEPARTMENT_OTHER): Payer: Medicare Other | Admitting: Internal Medicine

## 2016-10-07 VITALS — BP 127/65 | HR 97 | Temp 97.4°F | Resp 16 | Wt 165.0 lb

## 2016-10-07 DIAGNOSIS — Z9221 Personal history of antineoplastic chemotherapy: Secondary | ICD-10-CM | POA: Diagnosis not present

## 2016-10-07 DIAGNOSIS — I10 Essential (primary) hypertension: Secondary | ICD-10-CM

## 2016-10-07 DIAGNOSIS — E785 Hyperlipidemia, unspecified: Secondary | ICD-10-CM | POA: Diagnosis not present

## 2016-10-07 DIAGNOSIS — D649 Anemia, unspecified: Secondary | ICD-10-CM

## 2016-10-07 DIAGNOSIS — C9190 Lymphoid leukemia, unspecified not having achieved remission: Secondary | ICD-10-CM | POA: Insufficient documentation

## 2016-10-07 DIAGNOSIS — E039 Hypothyroidism, unspecified: Secondary | ICD-10-CM | POA: Diagnosis not present

## 2016-10-07 DIAGNOSIS — Z79899 Other long term (current) drug therapy: Secondary | ICD-10-CM

## 2016-10-07 DIAGNOSIS — C9110 Chronic lymphocytic leukemia of B-cell type not having achieved remission: Secondary | ICD-10-CM

## 2016-10-07 DIAGNOSIS — D696 Thrombocytopenia, unspecified: Secondary | ICD-10-CM

## 2016-10-07 DIAGNOSIS — R002 Palpitations: Secondary | ICD-10-CM | POA: Diagnosis not present

## 2016-10-07 DIAGNOSIS — M7989 Other specified soft tissue disorders: Secondary | ICD-10-CM | POA: Diagnosis not present

## 2016-10-07 LAB — CBC WITH DIFFERENTIAL/PLATELET
BASOS PCT: 0 %
Basophils Absolute: 0 10*3/uL (ref 0–0.1)
EOS PCT: 1 %
Eosinophils Absolute: 0 10*3/uL (ref 0–0.7)
HEMATOCRIT: 40.6 % (ref 35.0–47.0)
Hemoglobin: 14.3 g/dL (ref 12.0–16.0)
Lymphocytes Relative: 54 %
Lymphs Abs: 1.8 10*3/uL (ref 1.0–3.6)
MCH: 30.2 pg (ref 26.0–34.0)
MCHC: 35.2 g/dL (ref 32.0–36.0)
MCV: 85.7 fL (ref 80.0–100.0)
MONO ABS: 0.5 10*3/uL (ref 0.2–0.9)
MONOS PCT: 15 %
NEUTROS ABS: 1 10*3/uL — AB (ref 1.4–6.5)
Neutrophils Relative %: 30 %
PLATELETS: 121 10*3/uL — AB (ref 150–440)
RBC: 4.74 MIL/uL (ref 3.80–5.20)
RDW: 13.5 % (ref 11.5–14.5)
WBC: 3.3 10*3/uL — ABNORMAL LOW (ref 3.6–11.0)

## 2016-10-07 LAB — COMPREHENSIVE METABOLIC PANEL
ALBUMIN: 4.1 g/dL (ref 3.5–5.0)
ALT: 28 U/L (ref 14–54)
ANION GAP: 8 (ref 5–15)
AST: 34 U/L (ref 15–41)
Alkaline Phosphatase: 47 U/L (ref 38–126)
BILIRUBIN TOTAL: 1.4 mg/dL — AB (ref 0.3–1.2)
BUN: 16 mg/dL (ref 6–20)
CHLORIDE: 101 mmol/L (ref 101–111)
CO2: 23 mmol/L (ref 22–32)
Calcium: 9 mg/dL (ref 8.9–10.3)
Creatinine, Ser: 0.99 mg/dL (ref 0.44–1.00)
GFR calc Af Amer: 58 mL/min — ABNORMAL LOW (ref >60)
GFR calc non Af Amer: 50 mL/min — ABNORMAL LOW (ref >60)
GLUCOSE: 119 mg/dL — AB (ref 65–99)
POTASSIUM: 3.8 mmol/L (ref 3.5–5.1)
Sodium: 132 mmol/L — ABNORMAL LOW (ref 135–145)
Total Protein: 6.6 g/dL (ref 6.5–8.1)

## 2016-10-07 LAB — LACTATE DEHYDROGENASE: LDH: 252 U/L — ABNORMAL HIGH (ref 98–192)

## 2016-10-07 NOTE — Assessment & Plan Note (Addendum)
CLL on ibrutinib- since March 2016, previously on 2 pills secondary to intolerance. CT scan on 02/11/16 shows improvement/near complete resolution of axillary, mediastinal, and mesenteric adenopathy.   # Currently on Ibrutinib 280 mg/day.  Tolerating well- no major side effects noted.  # Thrombocytopenia 121- likely secondary to ibrutinib. Monitor for now.  # Bil ankle swelling chronic/stable-recommend stockings; continue hydrochlorothiazide.  # Elevated blood pressure: improved.  On hydralazine to 25 mg BID; Cozaar 100 mg a day. continue HCTZ- 12.5mg /day.    # Follow-up in 2 months with CBC/CMP/LDH.   CC: Dr.Tate.

## 2016-10-16 NOTE — Progress Notes (Signed)
[place] OFFICE PROGRESS NOTE  Patient Care Team: Albina Billet, MD as PCP - General (Internal Medicine)  No matching staging information was found for the patient.   Oncology History   1. Ultrasound revealed abdominal lymphadenopathy(March, 2013) 2. Biopsy as well as peripheral blood be suggestive of chronic lymphocytic leukemia (April, 2013) 3. Started on bendamustin and Rituxan from May of 2013 4. Has finished her 6 cycles of chemotherapy with Rituxan and bendamustine (October 26, 2011) 5.recurrent and progressive disease with progressive anemia.  Patient was started on IBRUTANIB (March, 2016)  taking 140 mg 3 pills a day  6. Patient was taken off IBRUTANIB in August of 2016 because of persistent myelosuppression even with reducing dose 7. Because of rising WBC count and anemia patient would be started back on a lower dose of improved on ibrutanib 140 mg 2 tablets a day     Chronic lymphocytic leukemia ([place])   06/06/2014 Initial Diagnosis    Chronic lymphocytic leukemia        INTERVAL HISTORY:  Heather [name] 83 y.o.  female pleasant patient above history of CLL currently on ibrutinib since March 2016 is here for follow-up.  Patient denies any headaches. Denies any unusual fatigue. Chronic mild swelling in the legs. No chest pain or shortness of off breath. No fevers or chills. No lumps or bumps. Denies any dizzy spells or falls. No nausea no vomiting. Denies bleeding. No hospitalizations. No syncopal episodes.  REVIEW OF SYSTEMS:  A complete 10 point review of system is done which is negative except mentioned above/history of present illness.   PAST MEDICAL HISTORY :  Past Medical History:  Diagnosis Date  . Anemia   . Cataracts, bilateral   . Chronic lymphocytic leukemia ([place]) 06/06/2014  . Hearing loss   . History of chemotherapy   . Hyperlipidemia   . Hypertension   . Hypothyroidism   . Palpitations   . Risk for falls   . Thrombocytopenia  ([place])     PAST SURGICAL HISTORY :   Past Surgical History:  Procedure Laterality Date  . CATARACT EXTRACTION Left 03/29/2012  . Excision left cervical node biopsy  05/04/2011    FAMILY HISTORY :  No family history on file.  SOCIAL HISTORY:   Social History  Substance Use Topics  . Smoking status: Never Smoker  . Smokeless tobacco: Never Used  . Alcohol use No    ALLERGIES:  has No Known Allergies.  MEDICATIONS:  Current Outpatient Prescriptions  Medication Sig Dispense Refill  . hydrALAZINE (APRESOLINE) 25 MG tablet Take 1 tablet (25 mg total) by mouth 2 (two) times daily. 60 tablet 4  . hydrochlorothiazide (MICROZIDE) 12.5 MG capsule Take 1 capsule (12.5 mg total) by mouth daily. 30 capsule 4  . Ibrutinib (IMBRUVICA) 280 MG TABS Take 1 tablet by mouth daily. 30 tablet 6  . levothyroxine (SYNTHROID, LEVOTHROID) 75 MCG tablet Take 75 mcg by mouth daily before breakfast.     . losartan (COZAAR) 100 MG tablet Take 100 mg by mouth daily.    . magnesium oxide (MAG-OX) 400 MG tablet Take 400 mg by mouth daily.    . meclizine (ANTIVERT) 12.5 MG tablet Take 1 tablet (12.5 mg total) by mouth at bedtime as needed. (Patient not taking: Reported on 04/06/2016) 30 tablet 3  . tiZANidine (ZANAFLEX) 2 MG tablet Take 2 mg by mouth 2 (two) times daily.     No current facility-administered medications for this visit.  PHYSICAL EXAMINATION: ECOG PERFORMANCE STATUS: 1 - Symptomatic but completely ambulatory  BP 127/65 (BP Location: Right Arm, Patient Position: Sitting)   Pulse 97   Temp (!) 97.4 F (36.3 C)   Resp 16   Wt 165 lb (74.8 kg)   BMI 30.18 kg/m   Filed Weights   10/07/16 1354  Weight: 165 lb (74.8 kg)    GENERAL: Well-nourished well-developed; Alert, no distress and comfortable.  Walks with a rolling walker. She is accompanied by her sister. EYES: no pallor or icterus OROPHARYNX: no thrush or ulceration;  NECK: supple, no masses felt LYMPH:  no palpable  lymphadenopathy in the cervical, axillary or inguinal regions LUNGS: clear to auscultation and  No wheeze or crackles HEART/CVS: regular rate & rhythm and no murmurs; Bilateral 2+ lower extremity edema ABDOMEN:abdomen soft, non-tender and normal bowel sounds Musculoskeletal:no cyanosis of digits and no clubbing  PSYCH: alert & oriented x 3 with fluent speech NEURO: no focal motor/sensory deficits SKIN: no lesions noted.   LABORATORY DATA:  I have reviewed the data as listed    Component Value Date/Time   NA 132 (L) 10/07/2016 1330   NA 133 (L) 05/08/2014 1349   K 3.8 10/07/2016 1330   K 3.6 05/08/2014 1349   CL 101 10/07/2016 1330   CL 102 05/08/2014 1349   CO2 23 10/07/2016 1330   CO2 26 05/08/2014 1349   GLUCOSE 119 (H) 10/07/2016 1330   GLUCOSE 82 05/08/2014 1349   BUN 16 10/07/2016 1330   BUN 18 05/08/2014 1349   CREATININE 0.99 10/07/2016 1330   CREATININE 1.13 (H) 05/08/2014 1349   CALCIUM 9.0 10/07/2016 1330   CALCIUM 9.1 05/08/2014 1349   PROT 6.6 10/07/2016 1330   PROT 6.7 05/08/2014 1349   ALBUMIN 4.1 10/07/2016 1330   ALBUMIN 4.2 05/08/2014 1349   AST 34 10/07/2016 1330   AST 24 05/08/2014 1349   ALT 28 10/07/2016 1330   ALT 13 (L) 05/08/2014 1349   ALKPHOS 47 10/07/2016 1330   ALKPHOS 41 05/08/2014 1349   BILITOT 1.4 (H) 10/07/2016 1330   BILITOT 1.3 (H) 05/08/2014 1349   GFRNONAA 50 (L) 10/07/2016 1330   GFRNONAA 45 (L) 05/08/2014 1349   GFRAA 58 (L) 10/07/2016 1330   GFRAA 52 (L) 05/08/2014 1349    No results found for: SPEP, UPEP  Lab Results  Component Value Date   WBC 3.3 (L) 10/07/2016   NEUTROABS 1.0 (L) 10/07/2016   HGB 14.3 10/07/2016   HCT 40.6 10/07/2016   MCV 85.7 10/07/2016   PLT 121 (L) 10/07/2016      Chemistry      Component Value Date/Time   NA 132 (L) 10/07/2016 1330   NA 133 (L) 05/08/2014 1349   K 3.8 10/07/2016 1330   K 3.6 05/08/2014 1349   CL 101 10/07/2016 1330   CL 102 05/08/2014 1349   CO2 23 10/07/2016 1330    CO2 26 05/08/2014 1349   BUN 16 10/07/2016 1330   BUN 18 05/08/2014 1349   CREATININE 0.99 10/07/2016 1330   CREATININE 1.13 (H) 05/08/2014 1349      Component Value Date/Time   CALCIUM 9.0 10/07/2016 1330   CALCIUM 9.1 05/08/2014 1349   ALKPHOS 47 10/07/2016 1330   ALKPHOS 41 05/08/2014 1349   AST 34 10/07/2016 1330   AST 24 05/08/2014 1349   ALT 28 10/07/2016 1330   ALT 13 (L) 05/08/2014 1349   BILITOT 1.4 (H) 10/07/2016 1330   BILITOT 1.3 (H)  05/08/2014 1349       RADIOGRAPHIC STUDIES: I have personally reviewed the radiological images as listed and agreed with the findings in the report. No results found.   ASSESSMENT & PLAN:  Chronic lymphocytic leukemia ([place]) CLL on ibrutinib- since March 2016, previously on 2 pills secondary to intolerance. CT scan on 02/11/16 shows improvement/near complete resolution of axillary, mediastinal, and mesenteric adenopathy.   # Currently on Ibrutinib 280 mg/day.  Tolerating well- no major side effects noted.  # Thrombocytopenia 121- likely secondary to ibrutinib. Monitor for now.  # Bil ankle swelling chronic/stable-recommend stockings; continue hydrochlorothiazide.  # Elevated blood pressure: improved.  On hydralazine to 25 mg BID; Cozaar 100 mg a day. continue HCTZ- 12.5mg /day.    # Follow-up in 2 months with CBC/CMP/LDH.   CC: Dr.Tate.    Orders Placed This Encounter  Procedures  . CBC with Differential/Platelet    Standing Status:   Future    Standing Expiration Date:   10/07/2017  . Comprehensive metabolic panel    Standing Status:   Future    Standing Expiration Date:   10/07/2017   All questions were answered. The patient knows to call the clinic with any problems, questions or concerns.      Cammie Sickle, MD 10/16/2016 8:19 PM

## 2016-12-07 ENCOUNTER — Inpatient Hospital Stay: Payer: Medicare Other | Attending: Internal Medicine

## 2016-12-07 ENCOUNTER — Inpatient Hospital Stay (HOSPITAL_BASED_OUTPATIENT_CLINIC_OR_DEPARTMENT_OTHER): Payer: Medicare Other | Admitting: Internal Medicine

## 2016-12-07 ENCOUNTER — Encounter: Payer: Self-pay | Admitting: Internal Medicine

## 2016-12-07 ENCOUNTER — Other Ambulatory Visit: Payer: Self-pay

## 2016-12-07 VITALS — BP 170/78 | HR 89 | Temp 97.8°F | Resp 20 | Wt 179.0 lb

## 2016-12-07 DIAGNOSIS — Z9221 Personal history of antineoplastic chemotherapy: Secondary | ICD-10-CM | POA: Diagnosis not present

## 2016-12-07 DIAGNOSIS — E785 Hyperlipidemia, unspecified: Secondary | ICD-10-CM | POA: Diagnosis not present

## 2016-12-07 DIAGNOSIS — D696 Thrombocytopenia, unspecified: Secondary | ICD-10-CM | POA: Insufficient documentation

## 2016-12-07 DIAGNOSIS — M7989 Other specified soft tissue disorders: Secondary | ICD-10-CM

## 2016-12-07 DIAGNOSIS — I10 Essential (primary) hypertension: Secondary | ICD-10-CM | POA: Diagnosis not present

## 2016-12-07 DIAGNOSIS — H9190 Unspecified hearing loss, unspecified ear: Secondary | ICD-10-CM | POA: Insufficient documentation

## 2016-12-07 DIAGNOSIS — Z79899 Other long term (current) drug therapy: Secondary | ICD-10-CM

## 2016-12-07 DIAGNOSIS — R002 Palpitations: Secondary | ICD-10-CM | POA: Insufficient documentation

## 2016-12-07 DIAGNOSIS — Z9181 History of falling: Secondary | ICD-10-CM | POA: Insufficient documentation

## 2016-12-07 DIAGNOSIS — E039 Hypothyroidism, unspecified: Secondary | ICD-10-CM

## 2016-12-07 DIAGNOSIS — C9110 Chronic lymphocytic leukemia of B-cell type not having achieved remission: Secondary | ICD-10-CM

## 2016-12-07 DIAGNOSIS — C9190 Lymphoid leukemia, unspecified not having achieved remission: Secondary | ICD-10-CM | POA: Insufficient documentation

## 2016-12-07 DIAGNOSIS — D649 Anemia, unspecified: Secondary | ICD-10-CM | POA: Diagnosis not present

## 2016-12-07 LAB — CBC WITH DIFFERENTIAL/PLATELET
BASOS ABS: 0 10*3/uL (ref 0–0.1)
BASOS PCT: 1 %
EOS ABS: 0 10*3/uL (ref 0–0.7)
EOS PCT: 1 %
HCT: 42.7 % (ref 35.0–47.0)
Hemoglobin: 14.5 g/dL (ref 12.0–16.0)
LYMPHS PCT: 59 %
Lymphs Abs: 1.9 10*3/uL (ref 1.0–3.6)
MCH: 29.5 pg (ref 26.0–34.0)
MCHC: 33.9 g/dL (ref 32.0–36.0)
MCV: 86.9 fL (ref 80.0–100.0)
MONO ABS: 0.4 10*3/uL (ref 0.2–0.9)
Monocytes Relative: 12 %
Neutro Abs: 0.9 10*3/uL — ABNORMAL LOW (ref 1.4–6.5)
Neutrophils Relative %: 27 %
PLATELETS: 110 10*3/uL — AB (ref 150–440)
RBC: 4.91 MIL/uL (ref 3.80–5.20)
RDW: 13.5 % (ref 11.5–14.5)
WBC: 3.3 10*3/uL — ABNORMAL LOW (ref 3.6–11.0)

## 2016-12-07 LAB — COMPREHENSIVE METABOLIC PANEL
ALT: 18 U/L (ref 14–54)
AST: 28 U/L (ref 15–41)
Albumin: 4.1 g/dL (ref 3.5–5.0)
Alkaline Phosphatase: 44 U/L (ref 38–126)
Anion gap: 8 (ref 5–15)
BILIRUBIN TOTAL: 1.3 mg/dL — AB (ref 0.3–1.2)
BUN: 20 mg/dL (ref 6–20)
CALCIUM: 9 mg/dL (ref 8.9–10.3)
CO2: 25 mmol/L (ref 22–32)
Chloride: 100 mmol/L — ABNORMAL LOW (ref 101–111)
Creatinine, Ser: 1.16 mg/dL — ABNORMAL HIGH (ref 0.44–1.00)
GFR calc Af Amer: 48 mL/min — ABNORMAL LOW (ref >60)
GFR, EST NON AFRICAN AMERICAN: 41 mL/min — AB (ref >60)
GLUCOSE: 114 mg/dL — AB (ref 65–99)
POTASSIUM: 3.7 mmol/L (ref 3.5–5.1)
Sodium: 133 mmol/L — ABNORMAL LOW (ref 135–145)
TOTAL PROTEIN: 6.7 g/dL (ref 6.5–8.1)

## 2016-12-07 NOTE — Assessment & Plan Note (Signed)
CLL on ibrutinib- since March 2016, previously on 2 pills secondary to intolerance. CT scan on 02/11/16 shows improvement/near complete resolution of axillary, mediastinal, and mesenteric adenopathy.   # Currently on Ibrutinib 280 mg/day.  Tolerating well- no major side effects noted.  # Thrombocytopenia 121- likely secondary to ibrutinib. Monitor for now.  # Bil ankle swelling chronic/stable-recommend stockings; continue hydrochlorothiazide.  # Elevated blood pressure: Systolic 037D.  Likely secondary to ibrutinib. On hydralazine to 25 mg BID; Cozaar 100 mg a day. continue HCTZ- 12.5mg /day.  Recommend keeping a log of blood pressures at home; and inform us or PCP needs to be above 444 systolic.   # Visual problems-awaiting laser surgery.  Discussed with Dr.Porfilio-who feels this is very minimal invasive-patient is not at risk for bleeding.  Patient will be informed of our discussion.  # Follow-up in 2 months with CBC/CMP/LDH.   CC: Dr.Tate.

## 2016-12-07 NOTE — Progress Notes (Signed)
Pt reports that she will need laser eye surgery under the care of Dr. Merla Riches. She states that she needs this surgery but has not decided yet whether to proceed.  She is currently on Imbruvica and has not missed any dosing.

## 2016-12-07 NOTE — Progress Notes (Signed)
[facility] OFFICE PROGRESS NOTE  Patient Care Team: Albina Billet, MD as PCP - General (Internal Medicine)  No matching staging information was found for the patient.   Oncology History   1. Ultrasound revealed abdominal lymphadenopathy(March, 2013) 2. Biopsy as well as peripheral blood be suggestive of chronic lymphocytic leukemia (April, 2013) 3. Started on bendamustin and Rituxan from May of 2013 4. Has finished her 6 cycles of chemotherapy with Rituxan and bendamustine (October 26, 2011) 5.recurrent and progressive disease with progressive anemia.  Patient was started on IBRUTANIB (March, 2016)  taking 140 mg 3 pills a day  6. Patient was taken off IBRUTANIB in August of 2016 because of persistent myelosuppression even with reducing dose 7. Because of rising WBC count and anemia patient would be started back on a lower dose of improved on ibrutanib 140 mg 2 tablets a day     Chronic lymphocytic leukemia ([place])   06/06/2014 Initial Diagnosis    Chronic lymphocytic leukemia        INTERVAL HISTORY:  Heather [name] 83 y.o.  female pleasant patient above history of CLL currently on ibrutinib since March 2016 is here for follow-up.  Patient states that she was recently evaluated by ophthalmology-for visual problems.  She is recommended laser surgery.  She is concerned.  Patient denies any nausea vomiting.  Denies any headaches. Chronic mild swelling in the legs. No chest pain or shortness of off breath. No fevers or chills. No lumps or bumps. Denies any dizzy spells or falls. No nausea no vomiting. Denies bleeding. No hospitalizations. No syncopal episodes.  REVIEW OF SYSTEMS:  A complete 10 point review of system is done which is negative except mentioned above/history of present illness.   PAST MEDICAL HISTORY :  Past Medical History:  Diagnosis Date  . Anemia   . Cataracts, bilateral   . Chronic lymphocytic leukemia ([place]) 06/06/2014  . Hearing loss   .  History of chemotherapy   . Hyperlipidemia   . Hypertension   . Hypothyroidism   . Palpitations   . Risk for falls   . Thrombocytopenia ([place])     PAST SURGICAL HISTORY :   Past Surgical History:  Procedure Laterality Date  . CATARACT EXTRACTION Left 03/29/2012  . Excision left cervical node biopsy  05/04/2011    FAMILY HISTORY :  History reviewed. No pertinent family history.  SOCIAL HISTORY:   Social History   Tobacco Use  . Smoking status: Never Smoker  . Smokeless tobacco: Never Used  Substance Use Topics  . Alcohol use: No    Alcohol/week: 0.0 oz  . Drug use: No    ALLERGIES:  has No Known Allergies.  MEDICATIONS:  Current Outpatient Medications  Medication Sig Dispense Refill  . hydrALAZINE (APRESOLINE) 25 MG tablet Take 1 tablet (25 mg total) by mouth 2 (two) times daily. (Patient taking differently: Take 25 mg daily by mouth. ) 60 tablet 4  . hydrochlorothiazide (MICROZIDE) 12.5 MG capsule Take 1 capsule (12.5 mg total) by mouth daily. 30 capsule 4  . Ibrutinib (IMBRUVICA) 280 MG TABS Take 1 tablet by mouth daily. 30 tablet 6  . levothyroxine (SYNTHROID, LEVOTHROID) 75 MCG tablet Take 75 mcg by mouth daily before breakfast.     . losartan (COZAAR) 100 MG tablet Take 100 mg by mouth daily.     No current facility-administered medications for this visit.     PHYSICAL EXAMINATION: ECOG PERFORMANCE STATUS: 1 - Symptomatic but completely ambulatory  BP Marland Kitchen)  170/78   Pulse 89   Temp 97.8 F (36.6 C)   Resp 20   Wt 179 lb (81.2 kg)   BMI 32.74 kg/m   Filed Weights   12/07/16 1525  Weight: 179 lb (81.2 kg)    GENERAL: Well-nourished well-developed; Alert, no distress and comfortable.  Walks with a rolling walker. She is accompanied by her sister. EYES: no pallor or icterus OROPHARYNX: no thrush or ulceration;  NECK: supple, no masses felt LYMPH:  no palpable lymphadenopathy in the cervical, axillary or inguinal regions LUNGS: clear to auscultation and   No wheeze or crackles HEART/CVS: regular rate & rhythm and no murmurs; Bilateral 2+ lower extremity edema ABDOMEN:abdomen soft, non-tender and normal bowel sounds Musculoskeletal:no cyanosis of digits and no clubbing  PSYCH: alert & oriented x 3 with fluent speech NEURO: no focal motor/sensory deficits SKIN: no lesions noted.   LABORATORY DATA:  I have reviewed the data as listed    Component Value Date/Time   NA 133 (L) 12/07/2016 1510   NA 133 (L) 05/08/2014 1349   K 3.7 12/07/2016 1510   K 3.6 05/08/2014 1349   CL 100 (L) 12/07/2016 1510   CL 102 05/08/2014 1349   CO2 25 12/07/2016 1510   CO2 26 05/08/2014 1349   GLUCOSE 114 (H) 12/07/2016 1510   GLUCOSE 82 05/08/2014 1349   BUN 20 12/07/2016 1510   BUN 18 05/08/2014 1349   CREATININE 1.16 (H) 12/07/2016 1510   CREATININE 1.13 (H) 05/08/2014 1349   CALCIUM 9.0 12/07/2016 1510   CALCIUM 9.1 05/08/2014 1349   PROT 6.7 12/07/2016 1510   PROT 6.7 05/08/2014 1349   ALBUMIN 4.1 12/07/2016 1510   ALBUMIN 4.2 05/08/2014 1349   AST 28 12/07/2016 1510   AST 24 05/08/2014 1349   ALT 18 12/07/2016 1510   ALT 13 (L) 05/08/2014 1349   ALKPHOS 44 12/07/2016 1510   ALKPHOS 41 05/08/2014 1349   BILITOT 1.3 (H) 12/07/2016 1510   BILITOT 1.3 (H) 05/08/2014 1349   GFRNONAA 41 (L) 12/07/2016 1510   GFRNONAA 45 (L) 05/08/2014 1349   GFRAA 48 (L) 12/07/2016 1510   GFRAA 52 (L) 05/08/2014 1349    No results found for: SPEP, UPEP  Lab Results  Component Value Date   WBC 3.3 (L) 12/07/2016   NEUTROABS 0.9 (L) 12/07/2016   HGB 14.5 12/07/2016   HCT 42.7 12/07/2016   MCV 86.9 12/07/2016   PLT 110 (L) 12/07/2016      Chemistry      Component Value Date/Time   NA 133 (L) 12/07/2016 1510   NA 133 (L) 05/08/2014 1349   K 3.7 12/07/2016 1510   K 3.6 05/08/2014 1349   CL 100 (L) 12/07/2016 1510   CL 102 05/08/2014 1349   CO2 25 12/07/2016 1510   CO2 26 05/08/2014 1349   BUN 20 12/07/2016 1510   BUN 18 05/08/2014 1349    CREATININE 1.16 (H) 12/07/2016 1510   CREATININE 1.13 (H) 05/08/2014 1349      Component Value Date/Time   CALCIUM 9.0 12/07/2016 1510   CALCIUM 9.1 05/08/2014 1349   ALKPHOS 44 12/07/2016 1510   ALKPHOS 41 05/08/2014 1349   AST 28 12/07/2016 1510   AST 24 05/08/2014 1349   ALT 18 12/07/2016 1510   ALT 13 (L) 05/08/2014 1349   BILITOT 1.3 (H) 12/07/2016 1510   BILITOT 1.3 (H) 05/08/2014 1349       RADIOGRAPHIC STUDIES: I have personally reviewed the radiological images  as listed and agreed with the findings in the report. No results found.   ASSESSMENT & PLAN:  Chronic lymphocytic leukemia ([place]) CLL on ibrutinib- since March 2016, previously on 2 pills secondary to intolerance. CT scan on 02/11/16 shows improvement/near complete resolution of axillary, mediastinal, and mesenteric adenopathy.   # Currently on Ibrutinib 280 mg/day.  Tolerating well- no major side effects noted.  # Thrombocytopenia 121- likely secondary to ibrutinib. Monitor for now.  # Bil ankle swelling chronic/stable-recommend stockings; continue hydrochlorothiazide.  # Elevated blood pressure: Systolic 597C.  Likely secondary to ibrutinib. On hydralazine to 25 mg BID; Cozaar 100 mg a day. continue HCTZ- 12.5mg /day.  Recommend keeping a log of blood pressures at home; and inform us or PCP needs to be above 163 systolic.   # Visual problems-awaiting laser surgery.  Discussed with Dr.Porfilio-who feels this is very minimal invasive-patient is not at risk for bleeding.  Patient will be informed of our discussion.  # Follow-up in 2 months with CBC/CMP/LDH.   CC: Dr.[facility].    Orders Placed This Encounter  Procedures  . CBC with Differential/Platelet    Standing Status:   Future    Standing Expiration Date:   12/07/2017  . Comprehensive metabolic panel    Standing Status:   Future    Standing Expiration Date:   12/07/2017  . Lactate dehydrogenase    Standing Status:   Future    Standing Expiration Date:    12/07/2017   All questions were answered. The patient knows to call the clinic with any problems, questions or concerns.      Cammie Sickle, MD 12/07/2016 5:30 PM

## 2016-12-14 ENCOUNTER — Telehealth: Payer: Self-pay | Admitting: Pharmacist

## 2016-12-14 DIAGNOSIS — C9110 Chronic lymphocytic leukemia of B-cell type not having achieved remission: Secondary | ICD-10-CM

## 2016-12-14 MED ORDER — IBRUTINIB 280 MG PO TABS: 1.0000 | ORAL_TABLET | Freq: Every day | ORAL | 6 refills | Status: DC

## 2016-12-14 MED FILL — IMBRUVICA 280 MG TAB: 280 | 28 days supply | Qty: 28 | Fill #0

## 2016-12-14 NOTE — Telephone Encounter (Signed)
Oral Oncology Patient Advocate Encounter  Moving patients medication to our [facility]. Called patient and she is fine with that. She said she has no copayment.    [place] Patient Advocate [phone] 12/14/2016 10:32 AM

## 2016-12-14 NOTE — Telephone Encounter (Signed)
Oral Oncology Patient Advocate Encounter  Sent e-mail to [facility] to mail out patient prescription.   Heather [name] Patient Advocate [phone] 12/14/2016 4:32 PM

## 2016-12-14 NOTE — Telephone Encounter (Signed)
Oral Oncology Pharmacist Encounter  Received refill prescription for Imbruvica (ibrutinib) for the treatment of CLL, planned duration until disease progression or unacceptable drug toxicity.  CBC from 12/07/16 assessed, no relevant lab abnormalities. Prescription dose and frequency assessed.   Current medication list in Epic reviewed, no DDIs with Ibrutinib identified.  Prescription has been e-scribed to the [facility] for benefits analysis and approval.  Patient education Counseled patient on administration, dosing, side effects, monitoring, drug-food interactions, safe handling, storage, and disposal. Patient will take 1 tablet (280mg ) by mouth daily.  Side effects include but not limited to: N/V/D, peripheral edema, fatigue. She stated that she does not currently have an issues.    Reviewed with patient importance of keeping a medication schedule and plan for any missed doses.  Heather [name] voiced understanding and appreciation. All questions answered.  Oral Oncology Clinic will continue to follow for insurance authorization, copayment issues, initial counseling and start date.  Provided patient with [facility] [place] [facility] phone number. Patient knows to call the office with questions or concerns. Oral Chemotherapy Navigation Clinic will continue to follow.  Darl Pikes, PharmD, BCPS Hematology/Oncology Clinical Pharmacist [facility] [place] [facility] [phone]  12/14/2016 10:01 AM

## 2016-12-14 NOTE — Telephone Encounter (Signed)
Oral Chemotherapy Pharmacist Encounter  Called [employer] to let them know Ms. Goodley was changing to [employer].  Darl Pikes, PharmD, BCPS Hematology/Oncology Clinical Pharmacist [facility] [place] [facility] [phone]  12/14/2016 2:16 PM

## 2016-12-17 NOTE — Telephone Encounter (Signed)
Oral Oncology Patient Advocate Encounter  Patients Imbruvica mailed from [facility] 12/16/2016.   [place] Patient Advocate [phone] 12/17/2016 8:03 AM

## 2017-01-13 MED FILL — IMBRUVICA 280 MG TAB: 280 | 28 days supply | Qty: 28 | Fill #1

## 2017-02-05 ENCOUNTER — Inpatient Hospital Stay: Payer: Medicare Other | Attending: Internal Medicine

## 2017-02-05 ENCOUNTER — Inpatient Hospital Stay (HOSPITAL_BASED_OUTPATIENT_CLINIC_OR_DEPARTMENT_OTHER): Payer: Medicare Other | Admitting: Internal Medicine

## 2017-02-05 VITALS — BP 129/71 | HR 73 | Resp 16 | Wt 169.4 lb

## 2017-02-05 DIAGNOSIS — D696 Thrombocytopenia, unspecified: Secondary | ICD-10-CM | POA: Insufficient documentation

## 2017-02-05 DIAGNOSIS — C9110 Chronic lymphocytic leukemia of B-cell type not having achieved remission: Secondary | ICD-10-CM

## 2017-02-05 DIAGNOSIS — I10 Essential (primary) hypertension: Secondary | ICD-10-CM | POA: Insufficient documentation

## 2017-02-05 LAB — CBC WITH DIFFERENTIAL/PLATELET
Basophils Absolute: 0 10*3/uL (ref 0–0.1)
Basophils Relative: 1 %
EOS ABS: 0 10*3/uL (ref 0–0.7)
EOS PCT: 0 %
HCT: 41.1 % (ref 35.0–47.0)
Hemoglobin: 14 g/dL (ref 12.0–16.0)
LYMPHS ABS: 1.5 10*3/uL (ref 1.0–3.6)
Lymphocytes Relative: 57 %
MCH: 29.5 pg (ref 26.0–34.0)
MCHC: 34.1 g/dL (ref 32.0–36.0)
MCV: 86.4 fL (ref 80.0–100.0)
MONO ABS: 0.4 10*3/uL (ref 0.2–0.9)
MONOS PCT: 13 %
Neutro Abs: 0.8 10*3/uL — ABNORMAL LOW (ref 1.4–6.5)
Neutrophils Relative %: 29 %
PLATELETS: 111 10*3/uL — AB (ref 150–440)
RBC: 4.76 MIL/uL (ref 3.80–5.20)
RDW: 13.5 % (ref 11.5–14.5)
WBC: 2.7 10*3/uL — AB (ref 3.6–11.0)

## 2017-02-05 LAB — COMPREHENSIVE METABOLIC PANEL
ALBUMIN: 4.3 g/dL (ref 3.5–5.0)
ALT: 29 U/L (ref 14–54)
AST: 40 U/L (ref 15–41)
Alkaline Phosphatase: 45 U/L (ref 38–126)
Anion gap: 9 (ref 5–15)
BUN: 20 mg/dL (ref 6–20)
CO2: 23 mmol/L (ref 22–32)
CREATININE: 0.95 mg/dL (ref 0.44–1.00)
Calcium: 9.2 mg/dL (ref 8.9–10.3)
Chloride: 100 mmol/L — ABNORMAL LOW (ref 101–111)
GFR calc Af Amer: >60 mL/min (ref >60)
GFR, EST NON AFRICAN AMERICAN: 53 mL/min — AB (ref >60)
GLUCOSE: 113 mg/dL — AB (ref 65–99)
Potassium: 3.5 mmol/L (ref 3.5–5.1)
Sodium: 132 mmol/L — ABNORMAL LOW (ref 135–145)
Total Bilirubin: 1.5 mg/dL — ABNORMAL HIGH (ref 0.3–1.2)
Total Protein: 6.6 g/dL (ref 6.5–8.1)

## 2017-02-05 LAB — LACTATE DEHYDROGENASE: LDH: 272 U/L — AB (ref 98–192)

## 2017-02-05 NOTE — Assessment & Plan Note (Addendum)
CLL on ibrutinib- since March 2016, previously on 2 pills secondary to intolerance. CT scan on 02/11/16 shows improvement/near complete resolution of axillary, mediastinal, and mesenteric adenopathy.   # Currently on Ibrutinib 280 mg/day.  Tolerating well- no major side effects noted [no bleeding no A. Fib; blood pressure well controlled].  No concerns for any progression.  # Thrombocytopenia 111- likely secondary to ibrutinib. Monitor for now.  # Elevated blood pressure: Likely secondary to ibrutinib. On hydralazine to 25 mg BID; Cozaar 100 mg a day. continue HCTZ- 12.5mg /day. Improved.  # Visual problems-awaiting laser surgery. Okay to proceed with surgery from hematology standpoint.  # Follow-up in 2 months with CBC/CMP/LDH.   CC: Dr.[facility].

## 2017-02-05 NOTE — Progress Notes (Signed)
[place] OFFICE PROGRESS NOTE  Patient Care Team: Albina Billet, MD as PCP - General (Internal Medicine)  No matching staging information was found for the patient.   Oncology History   1. Ultrasound revealed abdominal lymphadenopathy(March, 2013) 2. Biopsy as well as peripheral blood be suggestive of chronic lymphocytic leukemia (April, 2013) 3. Started on bendamustin and Rituxan from May of 2013 4. Has finished her 6 cycles of chemotherapy with Rituxan and bendamustine (October 26, 2011) 5.recurrent and progressive disease with progressive anemia.  Patient was started on IBRUTANIB (March, 2016)  taking 140 mg 3 pills a day  6. Patient was taken off IBRUTANIB in August of 2016 because of persistent myelosuppression even with reducing dose 7. Because of rising WBC count and anemia patient would be started back on a lower dose of improved on ibrutanib 140 mg 2 tablets a day     Chronic lymphocytic leukemia ([place])   06/06/2014 Initial Diagnosis    Chronic lymphocytic leukemia        INTERVAL HISTORY:  Heather [name] 84 y.o.  female pleasant patient above history of CLL currently on ibrutinib since March 2016 is here for follow-up.  Patient denies any easy bleeding.  Denies any headaches.  Denies any new lumps or bumps.  Appetite is good.  No nausea no vomiting or diarrhea no night sweats or weight loss.  REVIEW OF SYSTEMS:  A complete 10 point review of system is done which is negative except mentioned above/history of present illness.   PAST MEDICAL HISTORY :  Past Medical History:  Diagnosis Date  . Anemia   . Cataracts, bilateral   . Chronic lymphocytic leukemia ([place]) 06/06/2014  . Hearing loss   . History of chemotherapy   . Hyperlipidemia   . Hypertension   . Hypothyroidism   . Palpitations   . Risk for falls   . Thrombocytopenia ([place])     PAST SURGICAL HISTORY :   Past Surgical History:  Procedure Laterality Date  . CATARACT EXTRACTION  Left 03/29/2012  . Excision left cervical node biopsy  05/04/2011    FAMILY HISTORY :  No family history on file.  SOCIAL HISTORY:   Social History   Tobacco Use  . Smoking status: Never Smoker  . Smokeless tobacco: Never Used  Substance Use Topics  . Alcohol use: No    Alcohol/week: 0.0 oz  . Drug use: No    ALLERGIES:  has No Known Allergies.  MEDICATIONS:  Current Outpatient Medications  Medication Sig Dispense Refill  . hydrALAZINE (APRESOLINE) 25 MG tablet Take 1 tablet (25 mg total) by mouth 2 (two) times daily. (Patient taking differently: Take 25 mg daily by mouth. ) 60 tablet 4  . hydrochlorothiazide (MICROZIDE) 12.5 MG capsule Take 1 capsule (12.5 mg total) by mouth daily. 30 capsule 4  . Ibrutinib (IMBRUVICA) 280 MG TABS Take 1 tablet by mouth daily. 28 tablet 6  . levothyroxine (SYNTHROID, LEVOTHROID) 75 MCG tablet Take 75 mcg by mouth daily before breakfast.     . losartan (COZAAR) 100 MG tablet Take 100 mg by mouth daily.     No current facility-administered medications for this visit.     PHYSICAL EXAMINATION: ECOG PERFORMANCE STATUS: 1 - Symptomatic but completely ambulatory  BP 129/71 (BP Location: Left Arm, Patient Position: Sitting)   Pulse 73   Resp 16   Wt 169 lb 6.4 oz (76.8 kg)   BMI 30.98 kg/m   Filed Weights   02/05/17 1128  Weight: 169 lb 6.4 oz (76.8 kg)    GENERAL: Well-nourished well-developed; Alert, no distress and comfortable.  Walks with a rolling walker. She is accompanied by her sister. EYES: no pallor or icterus OROPHARYNX: no thrush or ulceration;  NECK: supple, no masses felt LYMPH:  no palpable lymphadenopathy in the cervical, axillary or inguinal regions LUNGS: clear to auscultation and  No wheeze or crackles HEART/CVS: regular rate & rhythm and no murmurs; Bilateral 2+ lower extremity edema ABDOMEN:abdomen soft, non-tender and normal bowel sounds Musculoskeletal:no cyanosis of digits and no clubbing  PSYCH: alert &  oriented x 3 with fluent speech NEURO: no focal motor/sensory deficits SKIN: no lesions noted.   LABORATORY DATA:  I have reviewed the data as listed    Component Value Date/Time   NA 132 (L) 02/05/2017 1103   NA 133 (L) 05/08/2014 1349   K 3.5 02/05/2017 1103   K 3.6 05/08/2014 1349   CL 100 (L) 02/05/2017 1103   CL 102 05/08/2014 1349   CO2 23 02/05/2017 1103   CO2 26 05/08/2014 1349   GLUCOSE 113 (H) 02/05/2017 1103   GLUCOSE 82 05/08/2014 1349   BUN 20 02/05/2017 1103   BUN 18 05/08/2014 1349   CREATININE 0.95 02/05/2017 1103   CREATININE 1.13 (H) 05/08/2014 1349   CALCIUM 9.2 02/05/2017 1103   CALCIUM 9.1 05/08/2014 1349   PROT 6.6 02/05/2017 1103   PROT 6.7 05/08/2014 1349   ALBUMIN 4.3 02/05/2017 1103   ALBUMIN 4.2 05/08/2014 1349   AST 40 02/05/2017 1103   AST 24 05/08/2014 1349   ALT 29 02/05/2017 1103   ALT 13 (L) 05/08/2014 1349   ALKPHOS 45 02/05/2017 1103   ALKPHOS 41 05/08/2014 1349   BILITOT 1.5 (H) 02/05/2017 1103   BILITOT 1.3 (H) 05/08/2014 1349   GFRNONAA 53 (L) 02/05/2017 1103   GFRNONAA 45 (L) 05/08/2014 1349   GFRAA >60 02/05/2017 1103   GFRAA 52 (L) 05/08/2014 1349    No results found for: SPEP, UPEP  Lab Results  Component Value Date   WBC 2.7 (L) 02/05/2017   NEUTROABS 0.8 (L) 02/05/2017   HGB 14.0 02/05/2017   HCT 41.1 02/05/2017   MCV 86.4 02/05/2017   PLT 111 (L) 02/05/2017      Chemistry      Component Value Date/Time   NA 132 (L) 02/05/2017 1103   NA 133 (L) 05/08/2014 1349   K 3.5 02/05/2017 1103   K 3.6 05/08/2014 1349   CL 100 (L) 02/05/2017 1103   CL 102 05/08/2014 1349   CO2 23 02/05/2017 1103   CO2 26 05/08/2014 1349   BUN 20 02/05/2017 1103   BUN 18 05/08/2014 1349   CREATININE 0.95 02/05/2017 1103   CREATININE 1.13 (H) 05/08/2014 1349      Component Value Date/Time   CALCIUM 9.2 02/05/2017 1103   CALCIUM 9.1 05/08/2014 1349   ALKPHOS 45 02/05/2017 1103   ALKPHOS 41 05/08/2014 1349   AST 40 02/05/2017 1103    AST 24 05/08/2014 1349   ALT 29 02/05/2017 1103   ALT 13 (L) 05/08/2014 1349   BILITOT 1.5 (H) 02/05/2017 1103   BILITOT 1.3 (H) 05/08/2014 1349       RADIOGRAPHIC STUDIES: I have personally reviewed the radiological images as listed and agreed with the findings in the report. No results found.   ASSESSMENT & PLAN:  Chronic lymphocytic leukemia ([place]) CLL on ibrutinib- since March 2016, previously on 2 pills secondary to intolerance. CT scan  on 02/11/16 shows improvement/near complete resolution of axillary, mediastinal, and mesenteric adenopathy.   # Currently on Ibrutinib 280 mg/day.  Tolerating well- no major side effects noted [no bleeding no A. Fib; blood pressure well controlled].  No concerns for any progression.  # Thrombocytopenia 111- likely secondary to ibrutinib. Monitor for now.  # Elevated blood pressure: Likely secondary to ibrutinib. On hydralazine to 25 mg BID; Cozaar 100 mg a day. continue HCTZ- 12.5mg /day. Improved.  # Visual problems-awaiting laser surgery. Okay to proceed with surgery from hematology standpoint.  # Follow-up in 2 months with CBC/CMP/LDH.   CC: Dr.[facility].    Orders Placed This Encounter  Procedures  . CBC with Differential/Platelet    Standing Status:   Future    Standing Expiration Date:   02/05/2018  . Comprehensive metabolic panel    Standing Status:   Future    Standing Expiration Date:   02/05/2018  . Lactate dehydrogenase    Standing Status:   Future    Standing Expiration Date:   02/05/2018   All questions were answered. The patient knows to call the clinic with any problems, questions or concerns.      Cammie Sickle, MD 02/05/2017 12:00 PM

## 2017-02-11 MED FILL — IMBRUVICA 280 MG TAB: 280 | 28 days supply | Qty: 28 | Fill #2

## 2017-02-15 ENCOUNTER — Emergency Department: Payer: Medicare Other

## 2017-02-15 ENCOUNTER — Other Ambulatory Visit: Payer: Self-pay

## 2017-02-15 ENCOUNTER — Encounter: Payer: Self-pay | Admitting: Emergency Medicine

## 2017-02-15 ENCOUNTER — Inpatient Hospital Stay
Admission: EM | Admit: 2017-02-15 | Discharge: 2017-02-17 | DRG: 871 | Disposition: A | Discharge status: Home Health | Payer: Medicare Other | Attending: Internal Medicine | Admitting: Internal Medicine

## 2017-02-15 DIAGNOSIS — C9110 Chronic lymphocytic leukemia of B-cell type not having achieved remission: Secondary | ICD-10-CM | POA: Diagnosis present

## 2017-02-15 DIAGNOSIS — E876 Hypokalemia: Secondary | ICD-10-CM | POA: Diagnosis present

## 2017-02-15 DIAGNOSIS — J101 Influenza due to other identified influenza virus with other respiratory manifestations: Secondary | ICD-10-CM | POA: Diagnosis present

## 2017-02-15 DIAGNOSIS — E871 Hypo-osmolality and hyponatremia: Secondary | ICD-10-CM | POA: Diagnosis present

## 2017-02-15 DIAGNOSIS — Z79890 Hormone replacement therapy (postmenopausal): Secondary | ICD-10-CM

## 2017-02-15 DIAGNOSIS — D6959 Other secondary thrombocytopenia: Secondary | ICD-10-CM | POA: Diagnosis present

## 2017-02-15 DIAGNOSIS — I10 Essential (primary) hypertension: Secondary | ICD-10-CM | POA: Diagnosis present

## 2017-02-15 DIAGNOSIS — E860 Dehydration: Secondary | ICD-10-CM | POA: Diagnosis present

## 2017-02-15 DIAGNOSIS — E785 Hyperlipidemia, unspecified: Secondary | ICD-10-CM | POA: Diagnosis present

## 2017-02-15 DIAGNOSIS — A419 Sepsis, unspecified organism: Secondary | ICD-10-CM | POA: Diagnosis present

## 2017-02-15 DIAGNOSIS — Z9221 Personal history of antineoplastic chemotherapy: Secondary | ICD-10-CM

## 2017-02-15 DIAGNOSIS — J9801 Acute bronchospasm: Secondary | ICD-10-CM | POA: Diagnosis present

## 2017-02-15 DIAGNOSIS — Z800 Family history of malignant neoplasm of digestive organs: Secondary | ICD-10-CM | POA: Diagnosis not present

## 2017-02-15 DIAGNOSIS — R0902 Hypoxemia: Secondary | ICD-10-CM

## 2017-02-15 DIAGNOSIS — R05 Cough: Secondary | ICD-10-CM | POA: Diagnosis present

## 2017-02-15 DIAGNOSIS — J9601 Acute respiratory failure with hypoxia: Secondary | ICD-10-CM | POA: Diagnosis present

## 2017-02-15 DIAGNOSIS — E039 Hypothyroidism, unspecified: Secondary | ICD-10-CM | POA: Diagnosis present

## 2017-02-15 LAB — URINALYSIS, ROUTINE W REFLEX MICROSCOPIC
BACTERIA UA: NONE SEEN
Bilirubin Urine: NEGATIVE
Glucose, UA: NEGATIVE mg/dL
KETONES UR: NEGATIVE mg/dL
Leukocytes, UA: NEGATIVE
Nitrite: NEGATIVE
PROTEIN: NEGATIVE mg/dL
SQUAMOUS EPITHELIAL / LPF: NONE SEEN
Specific Gravity, Urine: 1.013 (ref 1.005–1.030)
pH: 5 (ref 5.0–8.0)

## 2017-02-15 LAB — BLOOD GAS, VENOUS
ACID-BASE EXCESS: 2.1 mmol/L — AB (ref 0.0–2.0)
Bicarbonate: 25.3 mmol/L (ref 20.0–28.0)
O2 Saturation: 95.6 %
PH VEN: 7.48 — AB (ref 7.250–7.430)
Patient temperature: 37
pCO2, Ven: 34 mmHg — ABNORMAL LOW (ref 44.0–60.0)
pO2, Ven: 73 mmHg — ABNORMAL HIGH (ref 32.0–45.0)

## 2017-02-15 LAB — COMPREHENSIVE METABOLIC PANEL
ALBUMIN: 3.8 g/dL (ref 3.5–5.0)
ALT: 22 U/L (ref 14–54)
AST: 40 U/L (ref 15–41)
Alkaline Phosphatase: 37 U/L — ABNORMAL LOW (ref 38–126)
Anion gap: 9 (ref 5–15)
BUN: 12 mg/dL (ref 6–20)
CHLORIDE: 95 mmol/L — AB (ref 101–111)
CO2: 24 mmol/L (ref 22–32)
CREATININE: 0.95 mg/dL (ref 0.44–1.00)
Calcium: 8.6 mg/dL — ABNORMAL LOW (ref 8.9–10.3)
GFR calc Af Amer: >60 mL/min (ref >60)
GFR calc non Af Amer: 53 mL/min — ABNORMAL LOW (ref >60)
GLUCOSE: 109 mg/dL — AB (ref 65–99)
POTASSIUM: 3.5 mmol/L (ref 3.5–5.1)
Sodium: 128 mmol/L — ABNORMAL LOW (ref 135–145)
Total Bilirubin: 1.7 mg/dL — ABNORMAL HIGH (ref 0.3–1.2)
Total Protein: 6.4 g/dL — ABNORMAL LOW (ref 6.5–8.1)

## 2017-02-15 LAB — TROPONIN I

## 2017-02-15 LAB — INFLUENZA PANEL BY PCR (TYPE A & B)
INFLAPCR: POSITIVE — AB
INFLBPCR: NEGATIVE

## 2017-02-15 LAB — CBC WITH DIFFERENTIAL/PLATELET
BASOS ABS: 0 10*3/uL (ref 0–0.1)
BASOS PCT: 2 %
EOS PCT: 0 %
Eosinophils Absolute: 0 10*3/uL (ref 0–0.7)
HEMATOCRIT: 38.9 % (ref 35.0–47.0)
Hemoglobin: 13.1 g/dL (ref 12.0–16.0)
LYMPHS PCT: 21 %
Lymphs Abs: 0.4 10*3/uL — ABNORMAL LOW (ref 1.0–3.6)
MCH: 29.4 pg (ref 26.0–34.0)
MCHC: 33.7 g/dL (ref 32.0–36.0)
MCV: 87.2 fL (ref 80.0–100.0)
Monocytes Absolute: 0.3 10*3/uL (ref 0.2–0.9)
Monocytes Relative: 16 %
NEUTROS ABS: 1.3 10*3/uL — AB (ref 1.4–6.5)
Neutrophils Relative %: 61 %
PLATELETS: 85 10*3/uL — AB (ref 150–440)
RBC: 4.46 MIL/uL (ref 3.80–5.20)
RDW: 13.5 % (ref 11.5–14.5)
WBC: 2.1 10*3/uL — AB (ref 3.6–11.0)

## 2017-02-15 LAB — LACTIC ACID, PLASMA: LACTIC ACID, VENOUS: 0.9 mmol/L (ref 0.5–1.9)

## 2017-02-15 LAB — TECHNOLOGIST SMEAR REVIEW

## 2017-02-15 LAB — MRSA PCR SCREENING: MRSA by PCR: NEGATIVE

## 2017-02-15 MED ORDER — PIPERACILLIN-TAZOBACTAM 3.375 G IVPB 30 MIN
3.3750 g | Freq: Once | INTRAVENOUS | Status: AC
  Administered 2017-02-15: 3.375 g via INTRAVENOUS
  Filled 2017-02-15: qty 50

## 2017-02-15 MED ORDER — ACETAMINOPHEN 500 MG PO TABS
1000.0000 mg | ORAL_TABLET | Freq: Once | ORAL | Status: AC | PRN
  Administered 2017-02-15: 1000 mg via ORAL

## 2017-02-15 MED ORDER — HYDRALAZINE HCL 50 MG PO TABS
25.0000 mg | ORAL_TABLET | Freq: Two times a day (BID) | ORAL | Status: DC
  Administered 2017-02-15 – 2017-02-17 (×3): 25 mg via ORAL
  Filled 2017-02-15 (×2): qty 1
  Filled 2017-02-15: qty 0.5
  Filled 2017-02-15: qty 1

## 2017-02-15 MED ORDER — SODIUM CHLORIDE 0.9 % IV BOLUS (SEPSIS)
1000.0000 mL | Freq: Once | INTRAVENOUS | Status: AC
  Administered 2017-02-15: 1000 mL via INTRAVENOUS

## 2017-02-15 MED ORDER — OSELTAMIVIR PHOSPHATE 75 MG PO CAPS
75.0000 mg | ORAL_CAPSULE | Freq: Once | ORAL | Status: AC
  Administered 2017-02-15: 75 mg via ORAL
  Filled 2017-02-15: qty 1

## 2017-02-15 MED ORDER — OSELTAMIVIR PHOSPHATE 30 MG PO CAPS
30.0000 mg | ORAL_CAPSULE | Freq: Two times a day (BID) | ORAL | Status: DC
  Administered 2017-02-15 – 2017-02-17 (×4): 30 mg via ORAL
  Filled 2017-02-15 (×5): qty 1

## 2017-02-15 MED ORDER — ONDANSETRON HCL 4 MG PO TABS: 4.0000 mg | ORAL_TABLET | Freq: Four times a day (QID) | ORAL | Status: DC | PRN

## 2017-02-15 MED ORDER — BENZONATATE 100 MG PO CAPS
100.0000 mg | ORAL_CAPSULE | Freq: Three times a day (TID) | ORAL | Status: DC | PRN
  Administered 2017-02-15 – 2017-02-16 (×2): 100 mg via ORAL
  Filled 2017-02-15 (×2): qty 1

## 2017-02-15 MED ORDER — ACETAMINOPHEN 325 MG PO TABS
650.0000 mg | ORAL_TABLET | Freq: Four times a day (QID) | ORAL | Status: DC | PRN
  Administered 2017-02-15: 20:00:00 650 mg via ORAL
  Filled 2017-02-15: qty 2

## 2017-02-15 MED ORDER — SODIUM CHLORIDE 0.9 % IV BOLUS (SEPSIS)
500.0000 mL | Freq: Once | INTRAVENOUS | Status: AC
  Administered 2017-02-15: 500 mL via INTRAVENOUS

## 2017-02-15 MED ORDER — ACETAMINOPHEN 650 MG RE SUPP: 650.0000 mg | Freq: Four times a day (QID) | RECTAL | Status: DC | PRN

## 2017-02-15 MED ORDER — ENOXAPARIN SODIUM 40 MG/0.4ML ~~LOC~~ SOLN
40.0000 mg | SUBCUTANEOUS | Status: DC
  Filled 2017-02-15: qty 0.4

## 2017-02-15 MED ORDER — SODIUM CHLORIDE 0.9 % IV SOLN
1500.0000 mg | Freq: Once | INTRAVENOUS | Status: AC
  Administered 2017-02-15: 1500 mg via INTRAVENOUS
  Filled 2017-02-15: qty 1500

## 2017-02-15 MED ORDER — LEVOTHYROXINE SODIUM 50 MCG PO TABS
75.0000 ug | ORAL_TABLET | Freq: Every day | ORAL | Status: DC
  Administered 2017-02-15 – 2017-02-17 (×3): 75 ug via ORAL
  Filled 2017-02-15 (×3): qty 2

## 2017-02-15 MED ORDER — ACETAMINOPHEN 325 MG PO TABS: 650.0000 mg | ORAL_TABLET | Freq: Once | ORAL | Status: DC | PRN

## 2017-02-15 MED ORDER — SODIUM CHLORIDE 0.9 % IV BOLUS (SEPSIS): 1000.0000 mL | Freq: Once | INTRAVENOUS | Status: DC

## 2017-02-15 MED ORDER — IPRATROPIUM-ALBUTEROL 0.5-2.5 (3) MG/3ML IN SOLN: 3.0000 mL | Freq: Four times a day (QID) | RESPIRATORY_TRACT | Status: DC | PRN

## 2017-02-15 MED ORDER — LOSARTAN POTASSIUM 50 MG PO TABS
100.0000 mg | ORAL_TABLET | Freq: Every day | ORAL | Status: DC
  Administered 2017-02-15 – 2017-02-17 (×3): 100 mg via ORAL
  Filled 2017-02-15 (×3): qty 2

## 2017-02-15 MED ORDER — SODIUM CHLORIDE 0.9 % IV SOLN
INTRAVENOUS | Status: DC
  Administered 2017-02-15: 18:00:00 via INTRAVENOUS

## 2017-02-15 MED ORDER — ACETAMINOPHEN 500 MG PO TABS
ORAL_TABLET | ORAL | Status: AC
  Filled 2017-02-15: qty 2

## 2017-02-15 MED ORDER — ONDANSETRON HCL 4 MG/2ML IJ SOLN: 4.0000 mg | Freq: Four times a day (QID) | INTRAMUSCULAR | Status: DC | PRN

## 2017-02-15 NOTE — H&P (Signed)
[place] [facility] [place] NAME: Heather [name]    MR#:  [id number]  DATE OF BIRTH:  Aug 20, 1928  DATE OF ADMISSION:  02/15/2017  PRIMARY CARE PHYSICIAN: Albina Billet, MD   REQUESTING/REFERRING PHYSICIAN: Dr. Aundria Rud  CHIEF COMPLAINT:   Chief Complaint  Patient presents with  . Cough  . Weakness    HISTORY OF PRESENT ILLNESS:  Heather [name]  is a 84 y.o. female with a known history of CLL, hypertension, hyperlipidemia, hypothyroidism, who presents to the hospital due to generalized weakness, cough ongoing for the past 2-3 days. Patient says that she has not been feeling well for the past 2-3 days and has gotten progressively weak. She admits to a cough which is nonproductive, but no association shortness of breath, nausea, vomiting, chest pain, abdominal pain or any other associated symptoms. She presented to the ER was noted to be in acute respiratory failure with hypoxia, also noted to have sepsis given her fever, tachycardia and noted to have PCR positive for influenza A. Hospitalist services were contacted further treatment and evaluation.  PAST MEDICAL HISTORY:   Past Medical History:  Diagnosis Date  . Anemia   . Cataracts, bilateral   . Chronic lymphocytic leukemia ([place]) 06/06/2014  . Hearing loss   . History of chemotherapy   . Hyperlipidemia   . Hypertension   . Hypothyroidism   . Palpitations   . Risk for falls   . Thrombocytopenia ([place])     PAST SURGICAL HISTORY:   Past Surgical History:  Procedure Laterality Date  . CATARACT EXTRACTION Left 03/29/2012  . Excision left cervical node biopsy  05/04/2011    SOCIAL HISTORY:   Social History   Tobacco Use  . Smoking status: Never Smoker  . Smokeless tobacco: Never Used  Substance Use Topics  . Alcohol use: No    Alcohol/week: 0.0 oz    FAMILY HISTORY:   Family History  Problem Relation Age of Onset  . Heart disease Mother   . Stomach cancer Father   . Diabetes  Sister     DRUG ALLERGIES:  No Known Allergies  REVIEW OF SYSTEMS:   Review of Systems  Constitutional: Negative for fever and weight loss.  HENT: Negative for congestion, nosebleeds and tinnitus.   Eyes: Negative for blurred vision, double vision and redness.  Respiratory: Positive for cough and shortness of breath. Negative for hemoptysis.   Cardiovascular: Negative for chest pain, orthopnea, leg swelling and PND.  Gastrointestinal: Negative for abdominal pain, diarrhea, melena, nausea and vomiting.  Genitourinary: Negative for dysuria, hematuria and urgency.  Musculoskeletal: Negative for falls and joint pain.  Neurological: Positive for weakness. Negative for dizziness, tingling, sensory change, focal weakness, seizures and headaches.  Endo/Heme/Allergies: Negative for polydipsia. Does not bruise/bleed easily.  Psychiatric/Behavioral: Negative for depression and memory loss. The patient is not nervous/anxious.     MEDICATIONS AT HOME:   Prior to Admission medications   Medication Sig Start Date End Date Taking? Authorizing Provider  hydrALAZINE (APRESOLINE) 25 MG tablet Take 1 tablet (25 mg total) by mouth 2 (two) times daily. 04/06/16  Yes Cammie Sickle, MD  hydrochlorothiazide (MICROZIDE) 12.5 MG capsule Take 1 capsule (12.5 mg total) by mouth daily. 09/02/16  Yes Cammie Sickle, MD  Ibrutinib (IMBRUVICA) 280 MG TABS Take 1 tablet by mouth daily. 12/14/16  Yes Cammie Sickle, MD  levothyroxine (SYNTHROID, LEVOTHROID) 75 MCG tablet Take 75 mcg by mouth daily before breakfast.  05/08/14  Yes [provider]  losartan (COZAAR) 100 MG tablet Take 100 mg by mouth daily. 02/03/16  Yes [provider]      VITAL SIGNS:  Blood pressure (!) 127/38, pulse 79, temperature 98.7 F (37.1 C), temperature source Oral, resp. rate 14, height 5\' 2"  (1.575 m), weight 76.7 kg (169 lb), SpO2 93 %.  PHYSICAL EXAMINATION:  Physical Exam  GENERAL:  84  y.o.-year-old patient lying in the bed in Mild Resp. distress.  EYES: Pupils equal, round, reactive to light and accommodation. No scleral icterus. Extraocular muscles intact.  HEENT: Head atraumatic, normocephalic. Oropharynx and nasopharynx clear. No oropharyngeal erythema, moist oral mucosa  NECK:  Supple, no jugular venous distention. No thyroid enlargement, no tenderness.  LUNGS: Good air entry bilaterally, minimal wheezing bilaterally, no rales, rhonchi. No use of accessory muscles. CARDIOVASCULAR: S1, S2 RRR. No murmurs, rubs, gallops, clicks.  ABDOMEN: Soft, nontender, nondistended. Bowel sounds present. No organomegaly or mass.  EXTREMITIES: No pedal edema, cyanosis, or clubbing. + 2 pedal & radial pulses b/l.   NEUROLOGIC: Cranial nerves II through XII are intact. No focal Motor or sensory deficits appreciated b/l PSYCHIATRIC: The patient is alert and oriented x 3.  SKIN: No obvious rash, lesion, or ulcer.   LABORATORY PANEL:   CBC Recent Labs  Lab 02/15/17 0959  WBC 2.1*  HGB 13.1  HCT 38.9  PLT 85*   ------------------------------------------------------------------------------------------------------------------  Chemistries  Recent Labs  Lab 02/15/17 0959  NA 128*  K 3.5  CL 95*  CO2 24  GLUCOSE 109*  BUN 12  CREATININE 0.95  CALCIUM 8.6*  AST 40  ALT 22  ALKPHOS 37*  BILITOT 1.7*   ------------------------------------------------------------------------------------------------------------------  Cardiac Enzymes Recent Labs  Lab 02/15/17 0959  TROPONINI <0.03   ------------------------------------------------------------------------------------------------------------------  RADIOLOGY:  Dg Chest Portable 1 View  Result Date: 02/15/2017 CLINICAL DATA:  Cough EXAM: PORTABLE CHEST 1 VIEW COMPARISON:  CT chest 02/11/2016 Chest x-ray 04/15/2011 FINDINGS: The heart size and mediastinal contours are within normal limits. Both lungs are clear. The  visualized skeletal structures are unremarkable. IMPRESSION: No active disease. Electronically Signed   By: Kathreen Devoid   On: 02/15/2017 10:05     IMPRESSION AND PLAN:   84 year old female with past medical history of hypertension, hyperlipidemia, CLL, hypothyroidism who presents to the hospital due to cough, weakness.  1. Acute respiratory failure with hypoxia-secondary to flu. -Patient was positive for influenza A. Continue O2 support  2. Sepsis-patient meets criteria given her tachycardia, fever and influenza A PCR positive. -This is secondary to the flu, we'll start patient on Tamiflu, continue IV fluids and other supportive care.  3. Flu-patient is positive for influenza A. -We'll start the patient on Tamiflu, give Tessalon Perles for cough, DuoNeb's as needed for her bronchospasm.  4. Leukopenia/thrombocytopenia-secondary to her CLL with ongoing chemotherapy. No acute need for transfusion. Will follow counts.  5. Essential hypertension-continue hydralazine, losartan.  6. Hypothyroidism-continue Synthroid.    All the records are reviewed and case discussed with ED provider. Management plans discussed with the patient, family and they are in agreement.  CODE STATUS: Full code  TOTAL TIME TAKING CARE OF THIS PATIENT: 45 minutes.    Henreitta Leber M.D on 02/15/2017 at 12:51 PM  Between 7am to 6pm - Pager - [phone]  After 6pm go to [email or web address] - password [email or web address] [facility]  [place] [facility]  Office  [phone]  CC: Primary care physician; Albina Billet, MD

## 2017-02-15 NOTE — ED Triage Notes (Signed)
Pt arrived via ems from home with complaints of productive cough and weakness for the last few days. Pt denies any OTC mediation use. Pt alert and oriented x 4 upon arrival.

## 2017-02-15 NOTE — ED Notes (Signed)
Pt's oxygen dropped to 89% while ambulating in room. Pt placed back in bed and oxygen increased to 93%.

## 2017-02-15 NOTE — Progress Notes (Signed)
CODE SEPSIS - PHARMACY COMMUNICATION  **Broad Spectrum Antibiotics should be administered within 1 hour of Sepsis diagnosis**  Time Code Sepsis Called/Page Received: 1051  Antibiotics Ordered: Zosyn/Vancomycin  Time of 1st antibiotic administration: 1106  Additional action taken by pharmacy: none indicated   If necessary, Name of Provider/Nurse Contacted: N/A    Tomeeka Plaugher L ,PharmD Clinical Pharmacist  02/15/2017  11:21 AM

## 2017-02-15 NOTE — ED Notes (Signed)
Pt given sandwich tray 

## 2017-02-15 NOTE — Progress Notes (Signed)
PHARMACY NOTE:  ANTIMICROBIAL RENAL DOSAGE ADJUSTMENT  Current antimicrobial regimen includes a mismatch between antimicrobial dosage and estimated renal function.  As per policy approved by the Pharmacy & Therapeutics and Medical Executive Committees, the antimicrobial dosage will be adjusted accordingly.  Current antimicrobial dosage:  Tamiflu 75 mg PO BID.   Indication: Flu   Renal Function:  Estimated Creatinine Clearance: 40.7 mL/min (by C-G formula based on SCr of 0.95 mg/dL). []      On intermittent HD, scheduled: []      On CRRT    Antimicrobial dosage has been changed to:  Tamiflu 30 mg PO BID.   Additional comments:   Thank you for allowing pharmacy to be a part of this patient's care.  Marley Pakula D, [facility] 02/15/2017 5:08 PM

## 2017-02-15 NOTE — ED Notes (Signed)
RN unable to take report at this time 

## 2017-02-15 NOTE — ED Provider Notes (Addendum)
[facility] Emergency Department Provider Note  ____________________________________________  Time seen: Approximately 10:54 AM  I have reviewed the triage vital signs and the nursing notes.   HISTORY  Chief Complaint Cough and Weakness    HPI ELLISHA [name] is a 84 y.o. female a history of CLL on chemo, HTN, HL, presenting with cough.  The patient reports that for the past 3 days she has a cough that is occasionally productive of clear phlegm.  This is not associated with any congestion or rhinorrhea, sore throat, ear pain, shortness of breath.  She has not had any nausea vomiting or diarrhea.  She has not noted any fevers at home but did have a temperature of 102.7 on arrival to the emergency department.  The patient has not tried anything for her symptoms.  Past Medical History:  Diagnosis Date  . Anemia   . Cataracts, bilateral   . Chronic lymphocytic leukemia ([place]) 06/06/2014  . Hearing loss   . History of chemotherapy   . Hyperlipidemia   . Hypertension   . Hypothyroidism   . Palpitations   . Risk for falls   . Thrombocytopenia [facility])     Patient Active Problem List   Diagnosis Date Noted  . Chronic lymphocytic leukemia ([place]) 06/06/2014    Past Surgical History:  Procedure Laterality Date  . CATARACT EXTRACTION Left 03/29/2012  . Excision left cervical node biopsy  05/04/2011    Current Outpatient Rx  . Order #: [id number] Class: Normal  . Order #: [id number] Class: Normal  . Order #: [id number] Class: Normal  . Order #: [id number] Class: Historical Med  . Order #: [id number] Class: Historical Med    Allergies Patient has no known allergies.  No family history on file.  Social History Social History   Tobacco Use  . Smoking status: Never Smoker  . Smokeless tobacco: Never Used  Substance Use Topics  . Alcohol use: No    Alcohol/week: 0.0 oz  . Drug use: No    Review of Systems Constitutional: Positive fever.  No lightheadedness or  syncope. Eyes: No visual changes. ENT: No sore throat. No congestion or rhinorrhea. Cardiovascular: Denies chest pain. Denies palpitations. Respiratory: Denies shortness of breath.  Positive cough. Gastrointestinal: No abdominal pain.  No nausea, no vomiting.  No diarrhea.  No constipation. Genitourinary: Negative for dysuria. Musculoskeletal: Negative for back pain. Skin: Negative for rash. Neurological: Negative for headaches. No focal numbness, tingling or weakness.     ____________________________________________   PHYSICAL EXAM:  VITAL SIGNS: ED Triage Vitals  Enc Vitals Group     BP 02/15/17 0947 (!) 147/56     Pulse Rate 02/15/17 0947 93     Resp 02/15/17 0947 20     Temp 02/15/17 0947 (!) 102.7 F (39.3 C)     Temp Source 02/15/17 0947 Oral     SpO2 02/15/17 0947 93 %     Weight 02/15/17 0948 169 lb (76.7 kg)     Height 02/15/17 0948 5\' 2"  (1.575 m)     Head Circumference --      Peak Flow --      Pain Score 02/15/17 0948 0     Pain Loc --      Pain Edu? --      Excl. in [place]? --     Constitutional: Alert and oriented.  Chronically ill-appearing but nontoxic.   Answers questions appropriately. Eyes: Conjunctivae are normal.  EOMI. No scleral icterus. Head: Atraumatic. Nose: No congestion/rhinnorhea. Mouth/Throat: Mucous membranes are  mildly dry.  Neck: No stridor.  Supple.  No JVD.  No meningismus. Cardiovascular: Normal rate, regular rhythm. No murmurs, rubs or gallops.  Respiratory: Normal respiratory effort.  No accessory muscle use or retractions. Lungs CTAB.  No wheezes, rales or ronchi. Gastrointestinal: Soft, nontender and nondistended.  No guarding or rebound.  No peritoneal signs. Musculoskeletal: No LE edema. No ttp in the calves or palpable cords.  Negative Homan's sign. Neurologic:  A&Ox3.  Speech is clear.  Face and smile are symmetric.  EOMI.  Moves all extremities well. Skin:  Skin is warm, dry and intact. No rash noted. Psychiatric: Mood and  affect are normal. Speech and behavior are normal.  Normal judgement.  ____________________________________________   LABS (all labs ordered are listed, but only abnormal results are displayed)  Labs Reviewed  CBC WITH DIFFERENTIAL/PLATELET - Abnormal; Notable for the following components:      Result Value   WBC 2.1 (*)    Platelets 85 (*)    Neutro Abs 1.3 (*)    Lymphs Abs 0.4 (*)    All other components within normal limits  COMPREHENSIVE METABOLIC PANEL - Abnormal; Notable for the following components:   Sodium 128 (*)    Chloride 95 (*)    Glucose, Bld 109 (*)    Calcium 8.6 (*)    Total Protein 6.4 (*)    Alkaline Phosphatase 37 (*)    Total Bilirubin 1.7 (*)    GFR calc non Af Amer 53 (*)    All other components within normal limits  URINALYSIS, ROUTINE W REFLEX MICROSCOPIC - Abnormal; Notable for the following components:   Color, Urine YELLOW (*)    APPearance HAZY (*)    Hgb urine dipstick MODERATE (*)    All other components within normal limits  BLOOD GAS, VENOUS - Abnormal; Notable for the following components:   pH, Ven 7.48 (*)    pCO2, Ven 34 (*)    pO2, Ven 73.0 (*)    Acid-Base Excess 2.1 (*)    All other components within normal limits  INFLUENZA PANEL BY PCR (TYPE A & B) - Abnormal; Notable for the following components:   Influenza A By PCR POSITIVE (*)    All other components within normal limits  CULTURE, BLOOD (ROUTINE X 2)  CULTURE, BLOOD (ROUTINE X 2)  LACTIC ACID, PLASMA  TROPONIN I  TECHNOLOGIST SMEAR REVIEW   ____________________________________________  EKG  ED ECG REPORT I, Eula Listen, the attending physician, personally viewed and interpreted this ECG.   Date: 02/15/2017  EKG Time: 1058  Rate: 92  Rhythm: normal sinus rhythm; there is a poor baseline tracing and this will need to be repeated but it appears to be sinus not atrial fibrillation on my evaluation.  Axis: leftward  Intervals:prolonged QTc  ST&T Change: no  STEMI  Repeat EKG: ED ECG REPORT I, Eula Listen, the attending physician, personally viewed and interpreted this ECG.   Date: 02/15/2017  EKG Time: 1101  Rate: 92  Rhythm: normal sinus rhythm  Axis: leftward  Intervals:none  ST&T Change: No STEMI   ____________________________________________  RADIOLOGY  Dg Chest Portable 1 View  Result Date: 02/15/2017 CLINICAL DATA:  Cough EXAM: PORTABLE CHEST 1 VIEW COMPARISON:  CT chest 02/11/2016 Chest x-ray 04/15/2011 FINDINGS: The heart size and mediastinal contours are within normal limits. Both lungs are clear. The visualized skeletal structures are unremarkable. IMPRESSION: No active disease. Electronically Signed   By: Kathreen Devoid   On: 02/15/2017 10:05  ____________________________________________   PROCEDURES  Procedure(s) performed: None  Procedures  Critical Care performed: No ____________________________________________   INITIAL IMPRESSION / ASSESSMENT AND PLAN / ED COURSE  Pertinent labs & imaging results that were available during my care of the patient were reviewed by me and considered in my medical decision making (see chart for details).  84 y.o. female on chemo for CLL presenting with 3 days of cough.  On arrival to the emergency department on my examination she is satting 94; will do an ambulatory pulse oximetry reading.  I am concerned about pneumonia although I do not hear any focal infiltrate on her auscultatory exam.  Influenza is also on my differential.  We will also check the patient for influenza, as well as UTI.  Given that the patient is on chemo, we will plan to initiate immediate antibiotics, intravenous fluids, and I anticipate admission for continued evaluation and treatment.  ----------------------------------------- 11:05 AM on 02/15/2017 -----------------------------------------  I reviewed the patient's medical chart.  The patient's chest x-ray does not show any acute  infiltrate.  He does have a low white blood cell count of 2.1.  She is also hyponatremic at 128 and receiving intravenous fluids at this time.  ----------------------------------------- 12:22 PM on 02/15/2017 -----------------------------------------  The patient is positive for influenza A.  She also became hypoxic to 89% on room air with ambulation.  I have ordered Tamiflu, and the patient will be admitted to the hospital for further evaluation and treatment. ____________________________________________  FINAL CLINICAL IMPRESSION(S) / ED DIAGNOSES  Final diagnoses:  Hyponatremia  Sepsis, due to unspecified organism [facility])  Influenza A         NEW MEDICATIONS STARTED DURING THIS VISIT:  New Prescriptions   No medications on file      Eula Listen, MD 02/15/17 1224    Eula Listen, MD 02/15/17 1230

## 2017-02-16 LAB — CBC
HEMATOCRIT: 36.6 % (ref 35.0–47.0)
HEMOGLOBIN: 12.6 g/dL (ref 12.0–16.0)
MCH: 29.9 pg (ref 26.0–34.0)
MCHC: 34.4 g/dL (ref 32.0–36.0)
MCV: 87.1 fL (ref 80.0–100.0)
Platelets: 71 10*3/uL — ABNORMAL LOW (ref 150–440)
RBC: 4.2 MIL/uL (ref 3.80–5.20)
RDW: 13.9 % (ref 11.5–14.5)
WBC: 2.1 10*3/uL — AB (ref 3.6–11.0)

## 2017-02-16 LAB — BASIC METABOLIC PANEL
ANION GAP: 7 (ref 5–15)
BUN: 10 mg/dL (ref 6–20)
CHLORIDE: 103 mmol/L (ref 101–111)
CO2: 22 mmol/L (ref 22–32)
Calcium: 7.9 mg/dL — ABNORMAL LOW (ref 8.9–10.3)
Creatinine, Ser: 0.76 mg/dL (ref 0.44–1.00)
GFR calc non Af Amer: >60 mL/min (ref >60)
Glucose, Bld: 104 mg/dL — ABNORMAL HIGH (ref 65–99)
POTASSIUM: 3 mmol/L — AB (ref 3.5–5.1)
SODIUM: 132 mmol/L — AB (ref 135–145)

## 2017-02-16 MED ORDER — SODIUM CHLORIDE 0.9 % IV SOLN
INTRAVENOUS | Status: AC
  Administered 2017-02-16 (×2): via INTRAVENOUS

## 2017-02-16 MED ORDER — POTASSIUM CHLORIDE CRYS ER 20 MEQ PO TBCR
40.0000 meq | EXTENDED_RELEASE_TABLET | Freq: Every day | ORAL | Status: AC
  Administered 2017-02-16 – 2017-02-17 (×2): 40 meq via ORAL
  Filled 2017-02-16 (×2): qty 2

## 2017-02-16 NOTE — Progress Notes (Signed)
[place] at [place] NAME: Heather [name]    MR#:  [id number]  DATE OF BIRTH:  08-Jun-1928  SUBJECTIVE:   Patient came in with increasing shortness of breath fever and cough found to have influenza A.  She feels weak REVIEW OF SYSTEMS:   Review of Systems  Constitutional: Positive for fever. Negative for chills and weight loss.  HENT: Negative for ear discharge, ear pain and nosebleeds.   Eyes: Negative for blurred vision, pain and discharge.  Respiratory: Positive for cough and shortness of breath. Negative for sputum production, wheezing and stridor.   Cardiovascular: Negative for chest pain, palpitations, orthopnea and PND.  Gastrointestinal: Negative for abdominal pain, diarrhea, nausea and vomiting.  Genitourinary: Negative for frequency and urgency.  Musculoskeletal: Negative for back pain and joint pain.  Neurological: Positive for weakness. Negative for sensory change, speech change and focal weakness.  Psychiatric/Behavioral: Negative for depression and hallucinations. The patient is not nervous/anxious.    Tolerating Diet:yes Tolerating PT: pending  DRUG ALLERGIES:  No Known Allergies  VITALS:  Blood pressure (!) 148/43, pulse 81, temperature 99.5 F (37.5 C), temperature source Oral, resp. rate 20, height 5\' 2"  (1.575 m), weight 76.7 kg (169 lb), SpO2 97 %.  PHYSICAL EXAMINATION:   Physical Exam  GENERAL:  84 y.o.-year-old patient lying in the bed with no acute distress.  EYES: Pupils equal, round, reactive to light and accommodation. No scleral icterus. Extraocular muscles intact.  HEENT: Head atraumatic, normocephalic. Oropharynx and nasopharynx clear.  NECK:  Supple, no jugular venous distention. No thyroid enlargement, no tenderness.  LUNGS: Normal breath sounds bilaterally, no wheezing, rales, rhonchi. No use of accessory muscles of respiration.  CARDIOVASCULAR: S1, S2 normal. No murmurs, rubs, or gallops.   ABDOMEN: Soft, nontender, nondistended. Bowel sounds present. No organomegaly or mass.  EXTREMITIES: No cyanosis, clubbing or edema b/l.    NEUROLOGIC: Cranial nerves II through XII are intact. No focal Motor or sensory deficits b/l.   PSYCHIATRIC:  patient is alert and oriented x 3.  SKIN: No obvious rash, lesion, or ulcer.   LABORATORY PANEL:  CBC Recent Labs  Lab 02/16/17 0530  WBC 2.1*  HGB 12.6  HCT 36.6  PLT 71*    Chemistries  Recent Labs  Lab 02/15/17 0959 02/16/17 0530  NA 128* 132*  K 3.5 3.0*  CL 95* 103  CO2 24 22  GLUCOSE 109* 104*  BUN 12 10  CREATININE 0.95 0.76  CALCIUM 8.6* 7.9*  AST 40  --   ALT 22  --   ALKPHOS 37*  --   BILITOT 1.7*  --    Cardiac Enzymes Recent Labs  Lab 02/15/17 0959  TROPONINI <0.03   RADIOLOGY:  Dg Chest Portable 1 View  Result Date: 02/15/2017 CLINICAL DATA:  Cough EXAM: PORTABLE CHEST 1 VIEW COMPARISON:  CT chest 02/11/2016 Chest x-ray 04/15/2011 FINDINGS: The heart size and mediastinal contours are within normal limits. Both lungs are clear. The visualized skeletal structures are unremarkable. IMPRESSION: No active disease. Electronically Signed   By: Kathreen Devoid   On: 02/15/2017 10:05   ASSESSMENT AND PLAN:   84 year old female with past medical history of hypertension, hyperlipidemia, CLL, hypothyroidism who presents to the hospital due to cough, weakness.  1. Acute respiratory failure with hypoxia-secondary to flu. -Patient was positive for influenza A. Continue O2 support -sats .92% on RA  2. Sepsis-patient meets criteria given her tachycardia, fever and influenza A PCR positive. -This is  secondary to the flu,  patient on Tamiflu, continue IV fluids and other supportive care.  3. Flu-patient is positive for influenza A. - on Tamiflu, give Tessalon Perles for cough, DuoNeb's as needed for her bronchospasm.  4. Leukopenia/thrombocytopenia-secondary to her CLL with ongoing chemotherapy. No acute need for  transfusion. Will follow counts.  5. Essential hypertension-continue hydralazine, losartan.  6. Hypothyroidism-continue Synthroid  7.  Hypokalemia/ hyponatremia secondary to dehydration replete  Physical therapy to see patient. Patient lives at home by herself.  At baseline she ambulates with a walker.  Sisters check on her.  Case discussed with Care Management/Social Worker. Management plans discussed with the patient, family and they are in agreement.  CODE STATUS: Full  DVT Prophylaxis: lovenox  TOTAL TIME TAKING CARE OF THIS PATIENT: *30* minutes.  >50% time spent on counselling and coordination of care  POSSIBLE D/C IN  1-2 DAYS, DEPENDING ON CLINICAL CONDITION.  Note: This dictation was prepared with Dragon dictation along with smaller phrase technology. Any transcriptional errors that result from this process are unintentional.  Fritzi Mandes M.D on 02/16/2017 at 12:08 PM  Between 7am to 6pm - Pager - [phone]  After 6pm go to [email or web address] - password [email or web address] [place] [facility]  Office  [phone]  CC: Primary care physician; Albina Billet, MDPatient ID: Heather [name], female   DOB: 1928/05/25, 84 y.o.   MRN: [id number]

## 2017-02-16 NOTE — Progress Notes (Signed)
PT Cancellation Note  Patient Details Name: ARIBELLA VAVRA MRN: [id number] DOB: May 01, 1928   Cancelled Treatment:     Pt very lethargic upon PT arrival.  PT began evaluation and pt mumbled answers, appearing to become agitated.  Pt asked PT to come back when she is feeling better.  PT will re-attempt 02/17/2017.   Roxanne Gates 02/16/2017, 3:55 PM

## 2017-02-16 NOTE — Plan of Care (Signed)
  Progressing Fluid Volume: Hemodynamic stability will improve 02/16/2017 0951 - Progressing by Rowe Robert, RN Clinical Measurements: Diagnostic test results will improve 02/16/2017 0951 - Progressing by Rowe Robert, RN Signs and symptoms of infection will decrease 02/16/2017 0951 - Progressing by Rowe Robert, RN Respiratory: Ability to maintain adequate ventilation will improve 02/16/2017 0951 - Progressing by Rowe Robert, RN Education: Knowledge of General Education information will improve 02/16/2017 [phone] - Progressing by Rowe Robert, RN Health Behavior/Discharge Planning: Ability to manage health-related needs will improve 02/16/2017 0951 - Progressing by Rowe Robert, RN Clinical Measurements: Will remain free from infection 02/16/2017 0951 - Progressing by Rowe Robert, RN Respiratory complications will improve 02/16/2017 0951 - Progressing by Rowe Robert, RN Activity: Risk for activity intolerance will decrease 02/16/2017 0951 - Progressing by Rowe Robert, RN Nutrition: Adequate nutrition will be maintained 02/16/2017 0951 - Progressing by Rowe Robert, RN Elimination: Will not experience complications related to bowel motility 02/16/2017 0951 - Progressing by Rowe Robert, RN Pain Managment: General experience of comfort will improve 02/16/2017 0951 - Progressing by Rowe Robert, RN Safety: Ability to remain free from injury will improve 02/16/2017 0951 - Progressing by Rowe Robert, RN Skin Integrity: Risk for impaired skin integrity will decrease 02/16/2017 0951 - Progressing by Rowe Robert, RN

## 2017-02-17 LAB — BASIC METABOLIC PANEL
Anion gap: 7 (ref 5–15)
BUN: 8 mg/dL (ref 6–20)
CO2: 22 mmol/L (ref 22–32)
CREATININE: 0.76 mg/dL (ref 0.44–1.00)
Calcium: 8 mg/dL — ABNORMAL LOW (ref 8.9–10.3)
Chloride: 109 mmol/L (ref 101–111)
GFR calc Af Amer: >60 mL/min (ref >60)
GFR calc non Af Amer: >60 mL/min (ref >60)
GLUCOSE: 95 mg/dL (ref 65–99)
Potassium: 3.2 mmol/L — ABNORMAL LOW (ref 3.5–5.1)
SODIUM: 138 mmol/L (ref 135–145)

## 2017-02-17 MED ORDER — IBRUTINIB 280 MG PO TABS
1.0000 | ORAL_TABLET | Freq: Every day | ORAL | Status: DC
  Administered 2017-02-17: 1 via ORAL
  Filled 2017-02-17: qty 1

## 2017-02-17 MED ORDER — OSELTAMIVIR PHOSPHATE 30 MG PO CAPS: 30.0000 mg | ORAL_CAPSULE | Freq: Two times a day (BID) | ORAL | 0 refills | Status: DC

## 2017-02-17 MED ORDER — BENZONATATE 100 MG PO CAPS: 100.0000 mg | ORAL_CAPSULE | Freq: Three times a day (TID) | ORAL | 0 refills | Status: DC | PRN

## 2017-02-17 NOTE — NC FL2 (Signed)
[place] LEVEL OF CARE SCREENING TOOL     IDENTIFICATION  Patient Name: Heather [name] Birthdate: Jan 06, 1929 Sex: female Admission Date (Current Location): 02/15/2017  [place] and [place] Number:  Engineering geologist and Address:  [facility], [address] [place], [place] [address]      Provider Number: [id number]  Attending Physician Name and Address:  Fritzi Mandes, MD  Relative Name and Phone Number:       Current Level of Care: Hospital Recommended Level of Care: [place] Prior Approval Number:    Date Approved/Denied:   PASRR Number:   [id number]  Discharge Plan: SNF    Current Diagnoses: Patient Active Problem List   Diagnosis Date Noted  . Sepsis ([place]) 02/15/2017  . Chronic lymphocytic leukemia ([place]) 06/06/2014    Orientation RESPIRATION BLADDER Height & Weight     Self, Time, Situation, Place  Normal Continent Weight: 169 lb (76.7 kg) Height:  5\' 2"  (157.5 cm)  BEHAVIORAL SYMPTOMS/MOOD NEUROLOGICAL BOWEL NUTRITION STATUS      Continent Diet(See DC summary)  AMBULATORY STATUS COMMUNICATION OF NEEDS Skin   Limited Assist Verbally Normal                       Personal Care Assistance Level of Assistance  Bathing, Feeding, Dressing Bathing Assistance: Limited assistance Feeding assistance: Independent Dressing Assistance: Limited assistance     Functional Limitations Info  Sight, Hearing, Speech Sight Info: Adequate Hearing Info: Adequate Speech Info: Adequate    SPECIAL CARE FACTORS FREQUENCY  PT (By licensed PT), OT (By licensed OT)     PT Frequency: 5x  OT Frequency: 5x            Contractures Contractures Info: Not present    Additional Factors Info  Code Status, Allergies, Isolation Precautions Code Status Info: Full Code Allergies Info: NKA     Isolation Precautions Info: Droplet + for FLU     Current Medications (02/17/2017):  This is the current hospital active  medication list Current Facility-Administered Medications  Medication Dose Route Frequency Provider Last Rate Last Dose  . acetaminophen (TYLENOL) tablet 650 mg  650 mg Oral Q6H PRN Henreitta Leber, MD   650 mg at 02/15/17 2016   Or  . acetaminophen (TYLENOL) suppository 650 mg  650 mg Rectal Q6H PRN Henreitta Leber, MD      . benzonatate (TESSALON) capsule 100 mg  100 mg Oral TID PRN Henreitta Leber, MD   100 mg at 02/16/17 0940  . enoxaparin (LOVENOX) injection 40 mg  40 mg Subcutaneous Q24H Sainani, Vivek J, MD      . hydrALAZINE (APRESOLINE) tablet 25 mg  25 mg Oral BID Henreitta Leber, MD   25 mg at 02/17/17 1154  . Ibrutinib TABS 1 tablet  1 tablet Oral Daily Fritzi Mandes, MD   1 tablet at 02/17/17 1021  . ipratropium-albuterol (DUONEB) 0.5-2.5 (3) MG/3ML nebulizer solution 3 mL  3 mL Nebulization Q6H PRN Sainani, Belia Heman, MD      . levothyroxine (SYNTHROID, LEVOTHROID) tablet 75 mcg  75 mcg Oral QAC breakfast Henreitta Leber, MD   75 mcg at 02/17/17 1101  . losartan (COZAAR) tablet 100 mg  100 mg Oral Daily Henreitta Leber, MD   100 mg at 02/17/17 1102  . ondansetron (ZOFRAN) tablet 4 mg  4 mg Oral Q6H PRN Henreitta Leber, MD       Or  .  ondansetron (ZOFRAN) injection 4 mg  4 mg Intravenous Q6H PRN Henreitta Leber, MD      . oseltamivir (TAMIFLU) capsule 30 mg  30 mg Oral BID Henreitta Leber, MD   30 mg at 02/17/17 1101     Discharge Medications: Please see discharge summary for a list of discharge medications.  Relevant Imaging Results:  Relevant Lab Results:   Additional Information SSN:  [id number]  [place], [place]

## 2017-02-17 NOTE — Discharge Summary (Signed)
[place] [facility] [place] NAME: Heather [name]    MR#:  [id number]  DATE OF BIRTH:  03/04/1928  DATE OF ADMISSION:  02/15/2017 ADMITTING PHYSICIAN: Henreitta Leber, MD  DATE OF DISCHARGE: 02/17/2017  PRIMARY CARE PHYSICIAN: Albina Billet, MD    ADMISSION DIAGNOSIS:  Hyponatremia [E87.1] Influenza A [J10.1] Hypoxia [R09.02] Sepsis, due to unspecified organism ([place]) [A41.9]  DISCHARGE DIAGNOSIS:  Influenza A Generalized weakness Transient Hypoxia--resolved  SECONDARY DIAGNOSIS:   Past Medical History:  Diagnosis Date  . Anemia   . Cataracts, bilateral   . Chronic lymphocytic leukemia ([place]) 06/06/2014  . Hearing loss   . History of chemotherapy   . Hyperlipidemia   . Hypertension   . Hypothyroidism   . Palpitations   . Risk for falls   . Thrombocytopenia [facility])     HOSPITAL COURSE:  84 year old female with past medical history of hypertension, hyperlipidemia, CLL, hypothyroidism who presents to the hospital due to cough, weakness.  1. Acute respiratory failure with hypoxia-secondary to flu. -Patient was positive for influenza A. Continue O2 support as needed -sats .92% on RA -afebrile  2. Sepsis-patient meets criteria given her tachycardia, fever and influenza A PCR positive. -This is secondary to the flu,  patient on Tamiflu, recieved IV fluids and other supportive care.  3. Flu-patient is positive for influenza A. - on Tamiflu, prn Tessalon Perles for cough  4. Leukopenia/thrombocytopenia-secondary to her CLL with ongoing chemotherapy. (pt's home med- Ibrutinib) -No acute need for transfusion.  -pt follows with Dr Rogue Bussing  5. Essential hypertension-continue hydralazine, losartan.  6. Hypothyroidism-continue Synthroid  7.  Hypokalemia/ hyponatremia secondary to dehydration  And HCTZ  -received IVF -d/c HCTZ  Physical therapy to see patient--pt will benefit from rehab. She is weak and fraile Patient lives  at home by herself.  At baseline she ambulates with a walker.  Sisters check on her.  Pt agreeable with plan CSW notified. D/c today if insurance approval goes thru  D/w pt   CONSULTS OBTAINED:    DRUG ALLERGIES:  No Known Allergies  DISCHARGE MEDICATIONS:   Allergies as of 02/17/2017   No Known Allergies     Medication List    STOP taking these medications   hydrochlorothiazide 12.5 MG capsule Commonly known as:  MICROZIDE     TAKE these medications   benzonatate 100 MG capsule Commonly known as:  TESSALON Take 1 capsule (100 mg total) by mouth 3 (three) times daily as needed for cough.   hydrALAZINE 25 MG tablet Commonly known as:  APRESOLINE Take 1 tablet (25 mg total) by mouth 2 (two) times daily.   Ibrutinib 280 MG Tabs Commonly known as:  IMBRUVICA Take 1 tablet by mouth daily.   levothyroxine 75 MCG tablet Commonly known as:  SYNTHROID, LEVOTHROID Take 75 mcg by mouth daily before breakfast.   losartan 100 MG tablet Commonly known as:  COZAAR Take 100 mg by mouth daily.   oseltamivir 30 MG capsule Commonly known as:  TAMIFLU Take 1 capsule (30 mg total) by mouth 2 (two) times daily.       If you experience worsening of your admission symptoms, develop shortness of breath, life threatening emergency, suicidal or homicidal thoughts you must seek medical attention immediately by calling 911 or calling your MD immediately  if symptoms less severe.  You Must read complete instructions/literature along with all the possible adverse reactions/side effects for all the Medicines you take and that have been  prescribed to you. Take any new Medicines after you have completely understood and accept all the possible adverse reactions/side effects.   Please note  You were cared for by a hospitalist during your hospital stay. If you have any questions about your discharge medications or the care you received while you were in the hospital after you are discharged,  you can call the unit and asked to speak with the hospitalist on call if the hospitalist that took care of you is not available. Once you are discharged, your primary care physician will handle any further medical issues. Please note that NO REFILLS for any discharge medications will be authorized once you are discharged, as it is imperative that you return to your primary care physician (or establish a relationship with a primary care physician if you do not have one) for your aftercare needs so that they can reassess your need for medications and monitor your lab values.  DATA REVIEW:   CBC  Recent Labs  Lab 02/16/17 0530  WBC 2.1*  HGB 12.6  HCT 36.6  PLT 71*    Chemistries  Recent Labs  Lab 02/15/17 0959  02/17/17 0432  NA 128*   < > 138  K 3.5   < > 3.2*  CL 95*   < > 109  CO2 24   < > 22  GLUCOSE 109*   < > 95  BUN 12   < > 8  CREATININE 0.95   < > 0.76  CALCIUM 8.6*   < > 8.0*  AST 40  --   --   ALT 22  --   --   ALKPHOS 37*  --   --   BILITOT 1.7*  --   --    < > = values in this interval not displayed.    Microbiology Results   Recent Results (from the past 240 hour(s))  Blood Culture (routine x 2)     Status: None (Preliminary result)   Collection Time: 02/15/17 10:53 AM  Result Value Ref Range Status   Specimen Description BLOOD RIGHT WRIST  Final   Special Requests   Final    BOTTLES DRAWN AEROBIC AND ANAEROBIC Blood Culture adequate volume   Culture   Final    NO GROWTH 2 DAYS Performed at [facility], [address] [place], [place] [address]    Report Status PENDING  Incomplete  Blood Culture (routine x 2)     Status: None (Preliminary result)   Collection Time: 02/15/17 10:53 AM  Result Value Ref Range Status   Specimen Description BLOOD LEFT ARM  Final   Special Requests   Final    BOTTLES DRAWN AEROBIC AND ANAEROBIC Blood Culture adequate volume   Culture   Final    NO GROWTH 2 DAYS Performed at [facility], [address], [place], [place] [address]    Report Status PENDING  Incomplete  MRSA PCR Screening     Status: None   Collection Time: 02/15/17  6:17 PM  Result Value Ref Range Status   MRSA by PCR NEGATIVE NEGATIVE Final    Comment:        The GeneXpert MRSA Assay (FDA approved for NASAL specimens only), is one component of a comprehensive MRSA colonization surveillance program. It is not intended to diagnose MRSA infection nor to guide or monitor treatment for MRSA infections. Performed at [facility], [address], [place], [place] [address]     RADIOLOGY:  No results found.   Management plans discussed with the patient, family and they are in agreement.  CODE STATUS:     Code Status Orders  (From admission, onward)        Start     Ordered   02/15/17 1703  Full code  Continuous     02/15/17 1702    Code Status History    Date Active Date Inactive Code Status Order ID Comments User Context   This patient has a current code status but no historical code status.      TOTAL TIME TAKING CARE OF THIS PATIENT: 40 minutes.    Fritzi Mandes M.D on 02/17/2017 at 2:24 PM  Between 7am to 6pm - Pager - [phone] After 6pm go to [email or web address] - password [email or web address] [place] [facility]  Office  [phone]  CC: Primary care physician; Albina Billet, MD

## 2017-02-17 NOTE — Care Management (Signed)
Discharge to home with Home services per Dr. Posey Pronto. Spoke with Ms Tenorio and sister at the bedside. Discussed Home Health agencies. Chose [facility]. Pete Pelt, RN representative for [employer] updated. Referral faxed Shelbie Ammons RN MSN [place] [facility] [phone]

## 2017-02-17 NOTE — Progress Notes (Signed)
Patient ID: Heather [name], female   DOB: Mar 20, 1928, 84 y.o.   MRN: [id number]   Pt's barrier to d/c to rehab is her expensive CLL chemo med. Rehab does not allow pt to take her own med per CSW. Will have to d/c her home with HHPT

## 2017-02-17 NOTE — Care Management Note (Signed)
Case Management Note  Patient Details  Name: Heather [name] MRN: [id number] Date of Birth: 07/03/1928  Subjective/Objective:   Admitted to [facility] with the diagnosis of sepsis.   Lives alone at [facility], Sister is [name] [phone]).    [facility] [place].  No home health. No skilled facility. No home oxygen. Rolling walker and cane in the home. Takes care of all basic activities of daily living herself. Doesn't drive. Sisters help with errands. No falls. Usually a good appetite. Sister will transport.           Action/Plan: Physical therapy pending further plans  Expected Discharge Date:  02/17/17               Expected Discharge Plan:     In-House Referral:   yes  Discharge planning Services   yes  Post Acute Care Choice:    Choice offered to:     DME Arranged:    DME Agency:     HH Arranged:    HH Agency:     Status of Service:     If discussed at [employer], dates discussed:    Additional Comments:  Shelbie Ammons, RN MSN [facility] Care Management [phone] 02/17/2017, 8:42 AM

## 2017-02-17 NOTE — Evaluation (Addendum)
Physical Therapy Evaluation Patient Details Name: Heather [name] MRN: [id number] DOB: January 16, 1929 Today's Date: 02/17/2017   History of Present Illness  Pt admitted for sepsis and tested positive for flu A.  PMH includes thrombocytopenia, palpitations, hypothyroidism, Htn, HLD, hx of chemotherapy, hearing loss, lymphocytic leukemia, cataracts and anemia.  Clinical Impression  Pt is an 84 year old female who lives alone in a senior living apartment.  Pt is able to perform bed mobiilty with increased time and use of bed rail but requires min A to initiate STS as well as for balance.  PT provided VC's concerning safe use of RW related to hand placement and keeping AD close to body.  Pt able to ambulate in rm 20 ft but requires assistance to stand from toilet.  Pt presents with flexed posture and low foot clearance during gait indicative of fall risk with dynamic activity.  Pt presents with overall weakness of UE and LE and intact sensation to light touch.  Pt will continue to benefit from skilled PT with focus on strength, safe use of AD, balance and fall prevention, functional mobility and tolerance to activity.    Follow Up Recommendations SNF    Equipment Recommendations       Recommendations for Other Services       Precautions / Restrictions Precautions Precautions: Fall Restrictions Weight Bearing Restrictions: No      Mobility  Bed Mobility Overal bed mobility: Modified Independent             General bed mobility comments: Pt able to perform supine to sit and sit to supine with use of bed rail and increased time.  Transfers Overall transfer level: Needs assistance Equipment used: Rolling walker (2 wheeled) Transfers: Sit to/from Stand Sit to Stand: Min guard         General transfer comment: Pt able to intiate STS but is unsteady due to to weakness.  Requires multiple VC's for hand placement and posture.  Ambulation/Gait Ambulation/Gait assistance: Min  guard Ambulation Distance (Feet): 20 Feet Assistive device: None     Gait velocity interpretation: Below normal speed for age/gender General Gait Details: Pt able to ambulate without AD though PT educated pt concerning importance of use of RW for energy conservation and fall prevention.  PT educated pt concerning safe use of RW and pt stated understanding.  Pt presented with low foot clearance, flexed posture and decreased step length all indicative of high fall risk with dynamic activity.  Stairs            Wheelchair Mobility    Modified Rankin (Stroke Patients Only)       Balance Overall balance assessment: Modified Independent                                           Pertinent Vitals/Pain Pain Assessment: No/denies pain    Home Living Family/patient expects to be discharged to:: Private residence Living Arrangements: Alone Available Help at Discharge: Family;Available PRN/intermittently Type of Home: Apartment Home Access: Level entry     Home Layout: One level Home Equipment: Walker - 2 wheels;Cane - single point      Prior Function Level of Independence: Needs assistance   Gait / Transfers Assistance Needed: Pt able to transfer with RW.  ADL's / Homemaking Assistance Needed: Pt states that she did not need assistance with bathing and dressing prior to becoming  ill but that she did not feel comfortable bathing herself just before being admitted to hospital.  Pt received assistance with groceries.        Hand Dominance        Extremity/Trunk Assessment   Upper Extremity Assessment Upper Extremity Assessment: Generalized weakness    Lower Extremity Assessment Lower Extremity Assessment: Generalized weakness    Cervical / Trunk Assessment Cervical / Trunk Assessment: Kyphotic  Communication   Communication: No difficulties  Cognition Arousal/Alertness: Lethargic Behavior During Therapy: WFL for tasks  assessed/performed Overall Cognitive Status: Within Functional Limits for tasks assessed                                        General Comments      Exercises     Assessment/Plan    PT Assessment Patient needs continued PT services  PT Problem List Decreased strength;Decreased mobility;Decreased safety awareness;Decreased range of motion;Decreased activity tolerance;Decreased balance;Decreased knowledge of use of DME       PT Treatment Interventions DME instruction;Therapeutic activities;Gait training;Therapeutic exercise;Balance training;Functional mobility training;Patient/family education    PT Goals (Current goals can be found in the Care Plan section)  Acute Rehab PT Goals Patient Stated Goal: To get strong enough to return home. PT Goal Formulation: With patient Time For Goal Achievement: 03/03/17 Potential to Achieve Goals: Good    Frequency Min 2X/week   Barriers to discharge        Co-evaluation               AM-PAC PT "6 Clicks" Daily Activity  Outcome Measure Difficulty turning over in bed (including adjusting bedclothes, sheets and blankets)?: A Little Difficulty moving from lying on back to sitting on the side of the bed? : A Little Difficulty sitting down on and standing up from a chair with arms (e.g., wheelchair, bedside commode, etc,.)?: A Little Help needed moving to and from a bed to chair (including a wheelchair)?: A Little Help needed walking in hospital room?: A Little Help needed climbing 3-5 steps with a railing? : A Lot 6 Click Score: 17    End of Session Equipment Utilized During Treatment: Gait belt Activity Tolerance: Patient limited by fatigue Patient left: in bed;with call bell/phone within reach;with bed alarm set;with family/visitor present Nurse Communication: Mobility status PT Visit Diagnosis: Unsteadiness on feet (R26.81);Muscle weakness (generalized) (M62.81)    Time: 1120-1140 PT Time Calculation (min)  (ACUTE ONLY): 20 min   Charges:   PT Evaluation $PT Eval Low Complexity: 1 Low addend. 12:33 SBV PT Treatments $Gait Training: 8-22 mins   PT G Codes:   PT G-Codes **NOT FOR INPATIENT CLASS** Functional Assessment Tool Used: AM-PAC 6 Clicks Basic Mobility    Roxanne Gates, PT, DPT   Roxanne Gates 02/17/2017, 12:01 PM

## 2017-02-17 NOTE — Care Management Important Message (Signed)
Important Message  Patient Details  Name: Heather [name] MRN: [id number] Date of Birth: [birth year]   Medicare Important Message Given:  Yes    Shelbie Ammons, RN 02/17/2017, 6:56 AM

## 2017-02-17 NOTE — Discharge Planning (Signed)
Patient IV x2 removed. RN assessment and VS revealed stability for DC to home with [facility] services. Discharge papers given, explained and educated.  Informed of suggested FU appts and appts made.  HH will be contacting patient to set up first visit.  [facility] [place], [place].  Once ready, will be wheeled to front and family transporting home via car.

## 2017-02-17 NOTE — Progress Notes (Signed)
LCSW has met with patient and sister. Patient has been recommended for SNF and LCSW completed work up.  Patient lacks bed offers other than [place] which she is not agreeable to go. Patient is positive for flu and on expensive cancer medication causing additional barriers for bed offers. Facilities have been contacted in effort to let patient bring her medications, but this has been declined.  Patient and sister agreeable to return home at discharge. MD has been contacted and will place home health orders. Sister will transport patient home. CM has been made aware of disposition change as well as Therapist, sports.  No other needs voiced by patient or sister. Will return home this evening. CSW signing off.

## 2017-02-17 NOTE — Progress Notes (Signed)
[facility] [place] NAME: Heather [name]    MR#:  [id number]  DATE OF BIRTH:  08-Jul-1928  SUBJECTIVE:   Patient came in with increasing shortness of breath fever and cough found to have influenza A.  She feels weak Her sister in the room. REVIEW OF SYSTEMS:   Review of Systems  Constitutional: Positive for fever. Negative for chills and weight loss.  HENT: Negative for ear discharge, ear pain and nosebleeds.   Eyes: Negative for blurred vision, pain and discharge.  Respiratory: Positive for cough and shortness of breath. Negative for sputum production, wheezing and stridor.   Cardiovascular: Negative for chest pain, palpitations, orthopnea and PND.  Gastrointestinal: Negative for abdominal pain, diarrhea, nausea and vomiting.  Genitourinary: Negative for frequency and urgency.  Musculoskeletal: Negative for back pain and joint pain.  Neurological: Positive for weakness. Negative for sensory change, speech change and focal weakness.  Psychiatric/Behavioral: Negative for depression and hallucinations. The patient is not nervous/anxious.    Tolerating Diet:yes Tolerating PT: SNF  DRUG ALLERGIES:  No Known Allergies  VITALS:  Blood pressure (!) 152/64, pulse 97, temperature 97.8 F (36.6 C), temperature source Oral, resp. rate 18, height 5\' 2"  (1.575 m), weight 76.7 kg (169 lb), SpO2 95 %.  PHYSICAL EXAMINATION:   Physical Exam  GENERAL:  84 y.o.-year-old patient lying in the bed with no acute distress.  EYES: Pupils equal, round, reactive to light and accommodation. No scleral icterus. Extraocular muscles intact.  HEENT: Head atraumatic, normocephalic. Oropharynx and nasopharynx clear.  NECK:  Supple, no jugular venous distention. No thyroid enlargement, no tenderness.  LUNGS: Normal breath sounds bilaterally, no wheezing, rales, rhonchi. No use of accessory muscles of respiration.  CARDIOVASCULAR: S1, S2 normal. No murmurs, rubs,  or gallops.  ABDOMEN: Soft, nontender, nondistended. Bowel sounds present. No organomegaly or mass.  EXTREMITIES: No cyanosis, clubbing or edema b/l.    NEUROLOGIC: Cranial nerves II through XII are intact. No focal Motor or sensory deficits b/l.  Appears deconditioned PSYCHIATRIC:  patient is alert and oriented x 2.  SKIN: No obvious rash, lesion, or ulcer.   LABORATORY PANEL:  CBC Recent Labs  Lab 02/16/17 0530  WBC 2.1*  HGB 12.6  HCT 36.6  PLT 71*    Chemistries  Recent Labs  Lab 02/15/17 0959  02/17/17 0432  NA 128*   < > 138  K 3.5   < > 3.2*  CL 95*   < > 109  CO2 24   < > 22  GLUCOSE 109*   < > 95  BUN 12   < > 8  CREATININE 0.95   < > 0.76  CALCIUM 8.6*   < > 8.0*  AST 40  --   --   ALT 22  --   --   ALKPHOS 37*  --   --   BILITOT 1.7*  --   --    < > = values in this interval not displayed.   Cardiac Enzymes Recent Labs  Lab 02/15/17 0959  TROPONINI <0.03   RADIOLOGY:  No results found. ASSESSMENT AND PLAN:   84 year old female with past medical history of hypertension, hyperlipidemia, CLL, hypothyroidism who presents to the hospital due to cough, weakness.  1. Acute respiratory failure with hypoxia-secondary to flu. -Patient was positive for influenza A. Continue O2 support -sats .92% on RA -afebrile  2. Sepsis-patient meets criteria given her tachycardia, fever and influenza A PCR positive. -This is  secondary to the flu,  patient on Tamiflu, continue IV fluids and other supportive care.  3. Flu-patient is positive for influenza A. - on Tamiflu, give Tessalon Perles for cough, DuoNeb's as needed for her bronchospasm.  4. Leukopenia/thrombocytopenia-secondary to her CLL with ongoing chemotherapy. No acute need for transfusion.   5. Essential hypertension-continue hydralazine, losartan.  6. Hypothyroidism-continue Synthroid  7.  Hypokalemia/ hyponatremia secondary to dehydration replete  Physical therapy to see patient--pt will  benefit from rehab. She is weak and fraile Patient lives at home by herself.  At baseline she ambulates with a walker.  Sisters check on her.  Pt agreeable with plan CSW notified. D/c today if insurance approval goes thru  Case discussed with Care Management/Social Worker. Management plans discussed with the patient, family and they are in agreement.  CODE STATUS: Full  DVT Prophylaxis: lovenox  TOTAL TIME TAKING CARE OF THIS PATIENT: *30* minutes.  >50% time spent on counselling and coordination of care  POSSIBLE D/C IN  1-2 DAYS, DEPENDING ON CLINICAL CONDITION.  Note: This dictation was prepared with Dragon dictation along with smaller phrase technology. Any transcriptional errors that result from this process are unintentional.  Fritzi Mandes M.D on 02/17/2017 at 2:19 PM  Between 7am to 6pm - Pager - [phone]  After 6pm go to [email or web address] - password [email or web address] [facility]  Office  [phone]  CC: Primary care physician; Albina Billet, MDPatient ID: Heather [name], female   DOB: 10/09/1928, 84 y.o.   MRN: [id number]

## 2017-02-20 LAB — CULTURE, BLOOD (ROUTINE X 2)
CULTURE: NO GROWTH
Culture: NO GROWTH
SPECIAL REQUESTS: ADEQUATE
SPECIAL REQUESTS: ADEQUATE

## 2017-03-02 ENCOUNTER — Telehealth: Payer: Self-pay | Admitting: Pharmacist

## 2017-03-02 NOTE — Telephone Encounter (Signed)
Oral Chemotherapy Pharmacist Encounter  Follow-Up Form  Called patient today to follow up regarding patient's oral chemotherapy medication: Imbruvica (ibrutinib)  Original Start date of oral chemotherapy: 03/2014  Pt reports 0 tablets/doses of ibrutinib missed since she has been home from the hospital and only missed a few doses of medication while she was hospitalized.   Pt reports the following side effects: Patient stated is was doing ok, no issue reported  Recent labs reviewed: CBC with diff from 02/15/17  New medications?: potassium, no DDI with ibrutinib  Other Issues: N/A  Patient knows to call the office with questions or concerns. Oral Oncology Clinic will continue to follow.  Darl Pikes, PharmD, BCPS Hematology/Oncology Clinical Pharmacist [facility] [phone]  03/02/2017 10:44 AM

## 2017-03-04 ENCOUNTER — Other Ambulatory Visit: Payer: Self-pay | Admitting: Internal Medicine

## 2017-03-15 MED FILL — IMBRUVICA 280 MG TAB: 280 | 28 days supply | Qty: 28 | Fill #3

## 2017-03-26 ENCOUNTER — Emergency Department
Admission: EM | Admit: 2017-03-26 | Discharge: 2017-03-26 | Disposition: A | Discharge status: Home / Self Care | Payer: Medicare Other | Attending: Emergency Medicine | Admitting: Emergency Medicine

## 2017-03-26 ENCOUNTER — Encounter: Payer: Self-pay | Admitting: Emergency Medicine

## 2017-03-26 ENCOUNTER — Other Ambulatory Visit: Payer: Self-pay

## 2017-03-26 DIAGNOSIS — I10 Essential (primary) hypertension: Secondary | ICD-10-CM | POA: Diagnosis not present

## 2017-03-26 DIAGNOSIS — M6281 Muscle weakness (generalized): Secondary | ICD-10-CM | POA: Diagnosis not present

## 2017-03-26 DIAGNOSIS — E039 Hypothyroidism, unspecified: Secondary | ICD-10-CM | POA: Diagnosis not present

## 2017-03-26 DIAGNOSIS — R6889 Other general symptoms and signs: Secondary | ICD-10-CM

## 2017-03-26 DIAGNOSIS — Z79899 Other long term (current) drug therapy: Secondary | ICD-10-CM | POA: Insufficient documentation

## 2017-03-26 LAB — CBC
HCT: 41.6 % (ref 35.0–47.0)
Hemoglobin: 13.7 g/dL (ref 12.0–16.0)
MCH: 29.1 pg (ref 26.0–34.0)
MCHC: 32.9 g/dL (ref 32.0–36.0)
MCV: 88.2 fL (ref 80.0–100.0)
Platelets: 98 10*3/uL — ABNORMAL LOW (ref 150–440)
RBC: 4.72 MIL/uL (ref 3.80–5.20)
RDW: 15.3 % — ABNORMAL HIGH (ref 11.5–14.5)
WBC: 2.4 10*3/uL — ABNORMAL LOW (ref 3.6–11.0)

## 2017-03-26 LAB — URINALYSIS, COMPLETE (UACMP) WITH MICROSCOPIC
Bacteria, UA: NONE SEEN
Bilirubin Urine: NEGATIVE
GLUCOSE, UA: NEGATIVE mg/dL
HGB URINE DIPSTICK: NEGATIVE
Ketones, ur: NEGATIVE mg/dL
Nitrite: NEGATIVE
Protein, ur: NEGATIVE mg/dL
SQUAMOUS EPITHELIAL / LPF: NONE SEEN
Specific Gravity, Urine: 1.004 — ABNORMAL LOW (ref 1.005–1.030)
pH: 7 (ref 5.0–8.0)

## 2017-03-26 LAB — BASIC METABOLIC PANEL
Anion gap: 11 (ref 5–15)
BUN: 9 mg/dL (ref 6–20)
CALCIUM: 9.2 mg/dL (ref 8.9–10.3)
CO2: 22 mmol/L (ref 22–32)
CREATININE: 0.89 mg/dL (ref 0.44–1.00)
Chloride: 105 mmol/L (ref 101–111)
GFR calc non Af Amer: 57 mL/min — ABNORMAL LOW (ref >60)
Glucose, Bld: 121 mg/dL — ABNORMAL HIGH (ref 65–99)
Potassium: 3.4 mmol/L — ABNORMAL LOW (ref 3.5–5.1)
Sodium: 138 mmol/L (ref 135–145)

## 2017-03-26 NOTE — ED Triage Notes (Signed)
Pt to ED via [facility] from home for c/o not feeling well and feeling weaker than normal. Pt was in hospital 2 weeks ago for flu. Pt was supposed to follow up with her PCP today but after drinking her coffee this morning pt stated to feel more anxious and worried. Pt in NAD at this time.

## 2017-03-26 NOTE — ED Provider Notes (Signed)
[facility] Emergency Department Provider Note  ____________________________________________   I have reviewed the triage vital signs and the nursing notes.   HISTORY  Chief Complaint Weakness   History limited by: Not Limited   HPI Heather [name] is a 84 y.o. female who presents to the emergency department today because she felt unwell. This started this morning. She has a hard time describing what she was actually feeling. She does say she felt a little nervous but denies any chest pain, abdominal pain, shortness of breath or fevers.    Per medical record review patient has a history of HLD, HTN.  Past Medical History:  Diagnosis Date  . Anemia   . Cataracts, bilateral   . Chronic lymphocytic leukemia ([place]) 06/06/2014  . Hearing loss   . History of chemotherapy   . Hyperlipidemia   . Hypertension   . Hypothyroidism   . Palpitations   . Risk for falls   . Thrombocytopenia [facility])     Patient Active Problem List   Diagnosis Date Noted  . Sepsis ([place]) 02/15/2017  . Chronic lymphocytic leukemia ([place]) 06/06/2014    Past Surgical History:  Procedure Laterality Date  . CATARACT EXTRACTION Left 03/29/2012  . Excision left cervical node biopsy  05/04/2011    Prior to Admission medications   Medication Sig Start Date End Date Taking? Authorizing Provider  benzonatate (TESSALON) 100 MG capsule Take 1 capsule (100 mg total) by mouth 3 (three) times daily as needed for cough. 02/17/17   Fritzi Mandes, MD  hydrALAZINE (APRESOLINE) 25 MG tablet Take 1 tablet (25 mg total) by mouth 2 (two) times daily. 04/06/16   Cammie Sickle, MD  Ibrutinib (IMBRUVICA) 280 MG TABS Take 1 tablet by mouth daily. 12/14/16   Cammie Sickle, MD  levothyroxine (SYNTHROID, LEVOTHROID) 75 MCG tablet Take 75 mcg by mouth daily before breakfast.  05/08/14   [provider]  losartan (COZAAR) 100 MG tablet Take 100 mg by mouth daily. 02/03/16   [provider]  oseltamivir (TAMIFLU) 30 MG capsule Take 1 capsule (30 mg total) by mouth 2 (two) times daily. 02/17/17   Fritzi Mandes, MD    Allergies Patient has no known allergies.  Family History  Problem Relation Age of Onset  . Heart disease Mother   . Stomach cancer Father   . Diabetes Sister     Social History Social History   Tobacco Use  . Smoking status: Never Smoker  . Smokeless tobacco: Never Used  Substance Use Topics  . Alcohol use: No    Alcohol/week: 0.0 oz  . Drug use: No    Review of Systems Constitutional: No fever/chills Eyes: No visual changes. ENT: No sore throat. Cardiovascular: Denies chest pain. Respiratory: Denies shortness of breath. Gastrointestinal: No abdominal pain.  No nausea, no vomiting.  No diarrhea.   Genitourinary: Negative for dysuria. Musculoskeletal: Negative for back pain. Skin: Negative for rash. Neurological: Negative for headaches, focal weakness or numbness.  ____________________________________________   PHYSICAL EXAM:  VITAL SIGNS: ED Triage Vitals  Enc Vitals Group     BP 03/26/17 1018 (!) 181/59     Pulse Rate 03/26/17 1018 100     Resp 03/26/17 1018 16     Temp 03/26/17 1018 98.5 F (36.9 C)     Temp Source 03/26/17 1018 Oral     SpO2 03/26/17 1013 99 %     Weight 03/26/17 1016 169 lb (76.7 kg)   Constitutional: Alert and oriented.  Well appearing and in no distress. Eyes: Conjunctivae are normal.  ENT   Head: Normocephalic and atraumatic.   Nose: No congestion/rhinnorhea.   Mouth/Throat: Mucous membranes are moist.   Neck: No stridor. Hematological/Lymphatic/Immunilogical: No cervical lymphadenopathy. Cardiovascular: Normal rate, regular rhythm.  No murmurs, rubs, or gallops.  Respiratory: Normal respiratory effort without tachypnea nor retractions. Breath sounds are clear and equal bilaterally. No wheezes/rales/rhonchi. Gastrointestinal: Soft and non tender. No rebound. No guarding.  Genitourinary:  Deferred Musculoskeletal: Normal range of motion in all extremities. No lower extremity edema. Neurologic:  Normal speech and language. No gross focal neurologic deficits are appreciated.  Skin:  Skin is warm, dry and intact. No rash noted. Psychiatric: Mood and affect are normal. Speech and behavior are normal. Patient exhibits appropriate insight and judgment.  ____________________________________________    LABS (pertinent positives/negatives)  BMP na 138, k 3.4, glu 121, cr 0.89 CBC wnc 2.4, hgb 13.7, plt 98 UA no concerning findings for infection  ____________________________________________   EKG  I, Nance Pear, attending physician, personally viewed and interpreted this EKG  EKG Time: 1017 Rate: 100 Rhythm: sinus tachycardia Axis: left axis deviation Intervals: qtc 449 QRS: narrow ST changes: no st elevation Impression: abnormal ekg  ____________________________________________    RADIOLOGY  None  ____________________________________________   PROCEDURES  Procedures  ____________________________________________   INITIAL IMPRESSION / ASSESSMENT AND PLAN / ED COURSE  Pertinent labs & imaging results that were available during my care of the patient were reviewed by me and considered in my medical decision making (see chart for details).  Presented to the emergency department today because of concerns for feeling unwell.  She is unable to give good history as to what she exactly felt. Blood work and urine here however unconcerning. Will plan on discharging home.  ____________________________________________   FINAL CLINICAL IMPRESSION(S) / ED DIAGNOSES  Final diagnoses:  Feeling unwell     Note: This dictation was prepared with Dragon dictation. Any transcriptional errors that result from this process are unintentional     Nance Pear, MD 03/27/17 1139

## 2017-03-26 NOTE — Discharge Instructions (Signed)
Please seek medical attention for any high fevers, chest pain, shortness of breath, change in behavior, persistent vomiting, bloody stool or any other new or concerning symptoms.  

## 2017-03-27 ENCOUNTER — Other Ambulatory Visit: Payer: Self-pay | Admitting: Internal Medicine

## 2017-04-12 MED FILL — IMBRUVICA 280 MG TAB: 280 | 28 days supply | Qty: 28 | Fill #4

## 2017-04-14 ENCOUNTER — Inpatient Hospital Stay: Payer: Medicare Other | Attending: Internal Medicine

## 2017-04-14 ENCOUNTER — Other Ambulatory Visit: Payer: Self-pay

## 2017-04-14 ENCOUNTER — Inpatient Hospital Stay (HOSPITAL_BASED_OUTPATIENT_CLINIC_OR_DEPARTMENT_OTHER): Payer: Medicare Other | Admitting: Internal Medicine

## 2017-04-14 VITALS — BP 159/69 | HR 96 | Temp 98.0°F | Resp 20

## 2017-04-14 DIAGNOSIS — C9110 Chronic lymphocytic leukemia of B-cell type not having achieved remission: Secondary | ICD-10-CM

## 2017-04-14 DIAGNOSIS — R5383 Other fatigue: Secondary | ICD-10-CM | POA: Insufficient documentation

## 2017-04-14 DIAGNOSIS — I10 Essential (primary) hypertension: Secondary | ICD-10-CM | POA: Insufficient documentation

## 2017-04-14 DIAGNOSIS — D696 Thrombocytopenia, unspecified: Secondary | ICD-10-CM

## 2017-04-14 LAB — COMPREHENSIVE METABOLIC PANEL
ALT: 13 U/L — ABNORMAL LOW (ref 14–54)
ANION GAP: 8 (ref 5–15)
AST: 27 U/L (ref 15–41)
Albumin: 4 g/dL (ref 3.5–5.0)
Alkaline Phosphatase: 39 U/L (ref 38–126)
BILIRUBIN TOTAL: 1.4 mg/dL — AB (ref 0.3–1.2)
BUN: 10 mg/dL (ref 6–20)
CO2: 24 mmol/L (ref 22–32)
Calcium: 9.1 mg/dL (ref 8.9–10.3)
Chloride: 106 mmol/L (ref 101–111)
Creatinine, Ser: 0.86 mg/dL (ref 0.44–1.00)
GFR, EST NON AFRICAN AMERICAN: 59 mL/min — AB (ref >60)
Glucose, Bld: 139 mg/dL — ABNORMAL HIGH (ref 65–99)
Potassium: 3.5 mmol/L (ref 3.5–5.1)
Sodium: 138 mmol/L (ref 135–145)
TOTAL PROTEIN: 6.4 g/dL — AB (ref 6.5–8.1)

## 2017-04-14 LAB — CBC WITH DIFFERENTIAL/PLATELET
Basophils Absolute: 0 10*3/uL (ref 0–0.1)
Basophils Relative: 0 %
Eosinophils Absolute: 0 10*3/uL (ref 0–0.7)
Eosinophils Relative: 0 %
HEMATOCRIT: 40 % (ref 35.0–47.0)
Hemoglobin: 13.9 g/dL (ref 12.0–16.0)
LYMPHS PCT: 55 %
Lymphs Abs: 1.6 10*3/uL (ref 1.0–3.6)
MCH: 30.2 pg (ref 26.0–34.0)
MCHC: 34.7 g/dL (ref 32.0–36.0)
MCV: 87.2 fL (ref 80.0–100.0)
MONO ABS: 0.4 10*3/uL (ref 0.2–0.9)
MONOS PCT: 13 %
NEUTROS ABS: 1 10*3/uL — AB (ref 1.4–6.5)
Neutrophils Relative %: 32 %
Platelets: 106 10*3/uL — ABNORMAL LOW (ref 150–440)
RBC: 4.58 MIL/uL (ref 3.80–5.20)
RDW: 14.2 % (ref 11.5–14.5)
WBC: 3 10*3/uL — ABNORMAL LOW (ref 3.6–11.0)

## 2017-04-14 LAB — LACTATE DEHYDROGENASE: LDH: 244 U/L — AB (ref 98–192)

## 2017-04-14 LAB — TSH: TSH: 3.695 u[IU]/mL (ref 0.350–4.500)

## 2017-04-14 NOTE — Assessment & Plan Note (Signed)
CLL on ibrutinib- since March 2016, previously on 2 pills secondary to intolerance. CT scan on 02/11/16 shows improvement/near complete resolution of axillary, mediastinal, and mesenteric adenopathy.   # Currently on Ibrutinib 280 mg/day.  Tolerating well- no major side effects noted [no bleeding no A. Fib; blood pressure well controlled].  No concerns for any progression.  # Thrombocytopenia 108/ANC- 1.0- likely secondary to ibrutinib. Monitor for now.  # Fatigue- ? Re-check TSH today;   # Elevated blood pressure: Likely secondary to ibrutinib. 160s/60s-  On hydralazine to 25 mg BID; Cozaar 100 mg a day. continue HCTZ- 12.5mg /day. Improved.  # Follow-up in 2 months with CBC/CMP/LDH. Recheck TSH today-  CC: Dr.Tate.

## 2017-04-14 NOTE — Progress Notes (Signed)
[place] OFFICE PROGRESS NOTE  Patient Care Team: Albina Billet, MD as PCP - General (Internal Medicine)  No matching staging information was found for the patient.   Oncology History   1. Ultrasound revealed abdominal lymphadenopathy(March, 2013) 2. Biopsy as well as peripheral blood be suggestive of chronic lymphocytic leukemia (April, 2013) 3. Started on bendamustin and Rituxan from May of 2013 4. Has finished her 6 cycles of chemotherapy with Rituxan and bendamustine (October 26, 2011) 5.recurrent and progressive disease with progressive anemia.  Patient was started on IBRUTANIB (March, 2016)  taking 140 mg 3 pills a day  6. Patient was taken off IBRUTANIB in August of 2016 because of persistent myelosuppression even with reducing dose 7. Because of rising WBC count and anemia patient would be started back on a lower dose of improved on ibrutanib 140 mg 2 tablets a day     Chronic lymphocytic leukemia ([place])   06/06/2014 Initial Diagnosis    Chronic lymphocytic leukemia        INTERVAL HISTORY:  Heather [name] 84 y.o.  female pleasant patient above history of CLL currently on ibrutinib since March 2016 is here for follow-up.  Patient complains of fatigue-which is progressive getting worse.  Otherwise denies any easy bleeding or syncopal episodes.  Denies any headaches or falls.  No new lumps or bumps.  Appetite is good.  No weight loss.  No nausea no vomiting or diarrhea no night sweats.   REVIEW OF SYSTEMS:  A complete 10 point review of system is done which is negative except mentioned above/history of present illness.   PAST MEDICAL HISTORY :  Past Medical History:  Diagnosis Date  . Anemia   . Cataracts, bilateral   . Chronic lymphocytic leukemia ([place]) 06/06/2014  . Hearing loss   . History of chemotherapy   . Hyperlipidemia   . Hypertension   . Hypothyroidism   . Palpitations   . Risk for falls   . Thrombocytopenia ([place])     PAST  SURGICAL HISTORY :   Past Surgical History:  Procedure Laterality Date  . CATARACT EXTRACTION Left 03/29/2012  . Excision left cervical node biopsy  05/04/2011    FAMILY HISTORY :   Family History  Problem Relation Age of Onset  . Heart disease Mother   . Stomach cancer Father   . Diabetes Sister     SOCIAL HISTORY:   Social History   Tobacco Use  . Smoking status: Never Smoker  . Smokeless tobacco: Never Used  Substance Use Topics  . Alcohol use: No    Alcohol/week: 0.0 oz  . Drug use: No    ALLERGIES:  has No Known Allergies.  MEDICATIONS:  Current Outpatient Medications  Medication Sig Dispense Refill  . hydrALAZINE (APRESOLINE) 25 MG tablet TAKE 1 TABLET (25 MG TOTAL) BY MOUTH 2 (TWO) TIMES DAILY. 60 tablet 4  . Ibrutinib (IMBRUVICA) 280 MG TABS Take 1 tablet by mouth daily. 28 tablet 6  . levothyroxine (SYNTHROID, LEVOTHROID) 75 MCG tablet Take 75 mcg by mouth daily before breakfast. Only takes Saturday- Thursday. Hold synthroid on Friday    . Potassium 99 MG TABS Take 1 tablet by mouth daily.     No current facility-administered medications for this visit.     PHYSICAL EXAMINATION: ECOG PERFORMANCE STATUS: 1 - Symptomatic but completely ambulatory  BP (!) 159/69   Pulse 96   Temp 98 F (36.7 C) (Tympanic)   Resp 20   There were no  vitals filed for this visit.  GENERAL: Well-nourished well-developed; Alert, no distress and comfortable.  Walks with a rolling walker. She is accompanied by her sister. EYES: no pallor or icterus OROPHARYNX: no thrush or ulceration;  NECK: supple, no masses felt LYMPH:  no palpable lymphadenopathy in the cervical, axillary or inguinal regions LUNGS: clear to auscultation and  No wheeze or crackles HEART/CVS: regular rate & rhythm and no murmurs; Bilateral 2+ lower extremity edema ABDOMEN:abdomen soft, non-tender and normal bowel sounds Musculoskeletal:no cyanosis of digits and no clubbing  PSYCH: alert & oriented x 3 with  fluent speech NEURO: no focal motor/sensory deficits SKIN: no lesions noted.   LABORATORY DATA:  I have reviewed the data as listed    Component Value Date/Time   NA 138 04/14/2017 1325   NA 133 (L) 05/08/2014 1349   K 3.5 04/14/2017 1325   K 3.6 05/08/2014 1349   CL 106 04/14/2017 1325   CL 102 05/08/2014 1349   CO2 24 04/14/2017 1325   CO2 26 05/08/2014 1349   GLUCOSE 139 (H) 04/14/2017 1325   GLUCOSE 82 05/08/2014 1349   BUN 10 04/14/2017 1325   BUN 18 05/08/2014 1349   CREATININE 0.86 04/14/2017 1325   CREATININE 1.13 (H) 05/08/2014 1349   CALCIUM 9.1 04/14/2017 1325   CALCIUM 9.1 05/08/2014 1349   PROT 6.4 (L) 04/14/2017 1325   PROT 6.7 05/08/2014 1349   ALBUMIN 4.0 04/14/2017 1325   ALBUMIN 4.2 05/08/2014 1349   AST 27 04/14/2017 1325   AST 24 05/08/2014 1349   ALT 13 (L) 04/14/2017 1325   ALT 13 (L) 05/08/2014 1349   ALKPHOS 39 04/14/2017 1325   ALKPHOS 41 05/08/2014 1349   BILITOT 1.4 (H) 04/14/2017 1325   BILITOT 1.3 (H) 05/08/2014 1349   GFRNONAA 59 (L) 04/14/2017 1325   GFRNONAA 45 (L) 05/08/2014 1349   GFRAA >60 04/14/2017 1325   GFRAA 52 (L) 05/08/2014 1349    No results found for: SPEP, UPEP  Lab Results  Component Value Date   WBC 3.0 (L) 04/14/2017   NEUTROABS 1.0 (L) 04/14/2017   HGB 13.9 04/14/2017   HCT 40.0 04/14/2017   MCV 87.2 04/14/2017   PLT 106 (L) 04/14/2017      Chemistry      Component Value Date/Time   NA 138 04/14/2017 1325   NA 133 (L) 05/08/2014 1349   K 3.5 04/14/2017 1325   K 3.6 05/08/2014 1349   CL 106 04/14/2017 1325   CL 102 05/08/2014 1349   CO2 24 04/14/2017 1325   CO2 26 05/08/2014 1349   BUN 10 04/14/2017 1325   BUN 18 05/08/2014 1349   CREATININE 0.86 04/14/2017 1325   CREATININE 1.13 (H) 05/08/2014 1349      Component Value Date/Time   CALCIUM 9.1 04/14/2017 1325   CALCIUM 9.1 05/08/2014 1349   ALKPHOS 39 04/14/2017 1325   ALKPHOS 41 05/08/2014 1349   AST 27 04/14/2017 1325   AST 24 05/08/2014 1349    ALT 13 (L) 04/14/2017 1325   ALT 13 (L) 05/08/2014 1349   BILITOT 1.4 (H) 04/14/2017 1325   BILITOT 1.3 (H) 05/08/2014 1349       RADIOGRAPHIC STUDIES: I have personally reviewed the radiological images as listed and agreed with the findings in the report. No results found.   ASSESSMENT & PLAN:  Chronic lymphocytic leukemia ([place]) CLL on ibrutinib- since March 2016, previously on 2 pills secondary to intolerance. CT scan on 02/11/16 shows improvement/near complete  resolution of axillary, mediastinal, and mesenteric adenopathy.   # Currently on Ibrutinib 280 mg/day.  Tolerating well- no major side effects noted [no bleeding no A. Fib; blood pressure well controlled].  No concerns for any progression.  # Thrombocytopenia 108/ANC- 1.0- likely secondary to ibrutinib. Monitor for now.  # Fatigue- ? Re-check TSH today;   # Elevated blood pressure: Likely secondary to ibrutinib. 160s/60s-  On hydralazine to 25 mg BID; Cozaar 100 mg a day. continue HCTZ- 12.5mg /day. Improved.  # Follow-up in 2 months with CBC/CMP/LDH. Recheck TSH today-  CC: Dr.Tate.    Orders Placed This Encounter  Procedures  . CBC with Differential    Standing Status:   Future    Standing Expiration Date:   04/14/2018  . Comprehensive metabolic panel    Standing Status:   Future    Standing Expiration Date:   04/14/2018  . TSH    Standing Status:   Future    Number of Occurrences:   1    Standing Expiration Date:   04/15/2018  . Lactate dehydrogenase    Standing Status:   Future    Standing Expiration Date:   04/14/2018  . TSH    Standing Status:   Future    Standing Expiration Date:   04/15/2018   All questions were answered. The patient knows to call the clinic with any problems, questions or concerns.      Cammie Sickle, MD 04/18/2017 9:35 AM

## 2017-05-10 MED FILL — IMBRUVICA 280 MG TAB: 280 | 28 days supply | Qty: 28 | Fill #5

## 2017-05-28 ENCOUNTER — Telehealth: Payer: Self-pay | Admitting: Pharmacist

## 2017-05-28 NOTE — Telephone Encounter (Signed)
Oral Chemotherapy Pharmacist Encounter   Attempted to reach patient for follow up on oral medication: Ibrutinib. Line busy, unable to LVM.  Darl Pikes, PharmD, BCPS Hematology/Oncology Clinical Pharmacist [facility] [phone]  05/28/2017 11:49 AM

## 2017-06-01 NOTE — Telephone Encounter (Signed)
Oral Chemotherapy Pharmacist Encounter  Follow-Up Form  Called patient today to follow up regarding patient's oral chemotherapy medication: Imbruvica (ibrutinib)  Original Start date of oral chemotherapy: 03/2014  Pt reports 0 tablets/doses of ibrutinib missed in the last month.    Pt reports the following side effects: None reported  Recent labs reviewed: CBC from 04/14/17  New medications?: None reported  Other Issues: None reported  Patient knows to call the office with questions or concerns. Oral Oncology Clinic will continue to follow.  Darl Pikes, PharmD, BCPS Hematology/Oncology Clinical Pharmacist [facility] [phone]  06/01/2017 8:39 AM

## 2017-06-04 MED FILL — IMBRUVICA 280 MG TAB: 280 | 28 days supply | Qty: 28 | Fill #6

## 2017-06-18 ENCOUNTER — Other Ambulatory Visit: Payer: Self-pay

## 2017-06-18 ENCOUNTER — Inpatient Hospital Stay (HOSPITAL_BASED_OUTPATIENT_CLINIC_OR_DEPARTMENT_OTHER): Payer: Medicare Other | Admitting: Internal Medicine

## 2017-06-18 ENCOUNTER — Inpatient Hospital Stay: Payer: Medicare Other | Attending: Internal Medicine

## 2017-06-18 ENCOUNTER — Encounter: Payer: Self-pay | Admitting: Internal Medicine

## 2017-06-18 VITALS — BP 135/68 | HR 98 | Temp 97.8°F | Resp 20 | Ht 62.0 in | Wt 149.0 lb

## 2017-06-18 DIAGNOSIS — C9110 Chronic lymphocytic leukemia of B-cell type not having achieved remission: Secondary | ICD-10-CM | POA: Diagnosis not present

## 2017-06-18 DIAGNOSIS — D696 Thrombocytopenia, unspecified: Secondary | ICD-10-CM | POA: Diagnosis not present

## 2017-06-18 DIAGNOSIS — R634 Abnormal weight loss: Secondary | ICD-10-CM

## 2017-06-18 DIAGNOSIS — I10 Essential (primary) hypertension: Secondary | ICD-10-CM | POA: Insufficient documentation

## 2017-06-18 LAB — COMPREHENSIVE METABOLIC PANEL
ALT: 18 U/L (ref 14–54)
AST: 31 U/L (ref 15–41)
Albumin: 4.5 g/dL (ref 3.5–5.0)
Alkaline Phosphatase: 37 U/L — ABNORMAL LOW (ref 38–126)
Anion gap: 9 (ref 5–15)
BUN: 14 mg/dL (ref 6–20)
CHLORIDE: 105 mmol/L (ref 101–111)
CO2: 23 mmol/L (ref 22–32)
CREATININE: 0.83 mg/dL (ref 0.44–1.00)
Calcium: 9.5 mg/dL (ref 8.9–10.3)
GFR calc Af Amer: >60 mL/min (ref >60)
Glucose, Bld: 117 mg/dL — ABNORMAL HIGH (ref 65–99)
Potassium: 3.5 mmol/L (ref 3.5–5.1)
SODIUM: 137 mmol/L (ref 135–145)
Total Bilirubin: 1.2 mg/dL (ref 0.3–1.2)
Total Protein: 7 g/dL (ref 6.5–8.1)

## 2017-06-18 LAB — CBC WITH DIFFERENTIAL/PLATELET
Basophils Absolute: 0 10*3/uL (ref 0–0.1)
Basophils Relative: 1 %
EOS ABS: 0 10*3/uL (ref 0–0.7)
EOS PCT: 0 %
HCT: 42.2 % (ref 35.0–47.0)
HEMOGLOBIN: 14.4 g/dL (ref 12.0–16.0)
LYMPHS ABS: 1.3 10*3/uL (ref 1.0–3.6)
Lymphocytes Relative: 53 %
MCH: 30.2 pg (ref 26.0–34.0)
MCHC: 34 g/dL (ref 32.0–36.0)
MCV: 88.7 fL (ref 80.0–100.0)
MONOS PCT: 14 %
Monocytes Absolute: 0.3 10*3/uL (ref 0.2–0.9)
NEUTROS PCT: 32 %
Neutro Abs: 0.8 10*3/uL — ABNORMAL LOW (ref 1.4–6.5)
Platelets: 111 10*3/uL — ABNORMAL LOW (ref 150–440)
RBC: 4.76 MIL/uL (ref 3.80–5.20)
RDW: 14.2 % (ref 11.5–14.5)
WBC: 2.5 10*3/uL — AB (ref 3.6–11.0)

## 2017-06-18 LAB — TSH: TSH: 4.354 u[IU]/mL (ref 0.350–4.500)

## 2017-06-18 LAB — LACTATE DEHYDROGENASE: LDH: 235 U/L — AB (ref 98–192)

## 2017-06-18 NOTE — Progress Notes (Signed)
[facility] OFFICE PROGRESS NOTE  Patient Care Team: Albina Billet, MD as PCP - General (Internal Medicine)  Cancer Staging No matching staging information was found for the patient.   Oncology History   1. Ultrasound revealed abdominal lymphadenopathy(March, 2013) 2. Biopsy as well as peripheral blood be suggestive of chronic lymphocytic leukemia (April, 2013) 3. Started on bendamustin and Rituxan from May of 2013 4. Has finished her 6 cycles of chemotherapy with Rituxan and bendamustine (October 26, 2011) 5.recurrent and progressive disease with progressive anemia.  Patient was started on IBRUTANIB (March, 2016)  taking 140 mg 3 pills a day  6. Patient was taken off IBRUTANIB in August of 2016 because of persistent myelosuppression even with reducing dose 7. Because of rising WBC count and anemia patient would be started back on a lower dose of improved on ibrutanib 140 mg 2 tablets a day -----------------------------------------------------   DIAGNOSIS: [ ]  CLL  STAGE: 4      ;GOALS: Control  CURRENT/MOST RECENT THERAPY [2016] ibrutinib      Chronic lymphocytic leukemia ([place])   06/06/2014 Initial Diagnosis    Chronic lymphocytic leukemia         INTERVAL HISTORY:  Heather [name] 84 y.o.  female pleasant patient above history of CLL currently on ibrutinib is here for follow-up.  Patient has lost weight.  She is not sure why.  Denies any early satiety.  Denies any nausea vomiting.  Denies any loss of appetite.  Review of Systems  Constitutional: Positive for weight loss. Negative for chills, diaphoresis, fever and malaise/fatigue.  HENT: Negative for nosebleeds and sore throat.   Eyes: Negative for double vision.  Respiratory: Negative for cough, hemoptysis, sputum production, shortness of breath and wheezing.   Cardiovascular: Positive for leg swelling. Negative for chest pain, palpitations and orthopnea.  Gastrointestinal: Negative for abdominal  pain, blood in stool, constipation, diarrhea, heartburn, melena, nausea and vomiting.  Genitourinary: Negative for dysuria, frequency and urgency.  Musculoskeletal: Negative for back pain and joint pain.  Skin: Negative.  Negative for itching and rash.  Neurological: Negative for dizziness, tingling, focal weakness, weakness and headaches.  Endo/Heme/Allergies: Does not bruise/bleed easily.  Psychiatric/Behavioral: Negative for depression. The patient is not nervous/anxious and does not have insomnia.       PAST MEDICAL HISTORY :  Past Medical History:  Diagnosis Date  . Anemia   . Cataracts, bilateral   . Chronic lymphocytic leukemia ([place]) 06/06/2014  . Hearing loss   . History of chemotherapy   . Hyperlipidemia   . Hypertension   . Hypothyroidism   . Palpitations   . Risk for falls   . Thrombocytopenia ([place])     PAST SURGICAL HISTORY :   Past Surgical History:  Procedure Laterality Date  . CATARACT EXTRACTION Left 03/29/2012  . Excision left cervical node biopsy  05/04/2011    FAMILY HISTORY :   Family History  Problem Relation Age of Onset  . Heart disease Mother   . Stomach cancer Father   . Diabetes Sister     SOCIAL HISTORY:   Social History   Tobacco Use  . Smoking status: Never Smoker  . Smokeless tobacco: Never Used  Substance Use Topics  . Alcohol use: No    Alcohol/week: 0.0 oz  . Drug use: No    ALLERGIES:  has No Known Allergies.  MEDICATIONS:  Current Outpatient Medications  Medication Sig Dispense Refill  . hydrALAZINE (APRESOLINE) 25 MG tablet TAKE 1 TABLET (25  MG TOTAL) BY MOUTH 2 (TWO) TIMES DAILY. 60 tablet 4  . Ibrutinib (IMBRUVICA) 280 MG TABS Take 1 tablet by mouth daily. 28 tablet 6  . levothyroxine (SYNTHROID, LEVOTHROID) 75 MCG tablet Take 75 mcg by mouth daily before breakfast. Only takes Saturday- Thursday. Hold synthroid on Friday    . losartan (COZAAR) 100 MG tablet Take 100 mg by mouth daily.  3  . Potassium 99 MG TABS Take 1  tablet by mouth daily.     No current facility-administered medications for this visit.     PHYSICAL EXAMINATION: ECOG PERFORMANCE STATUS: 1 - Symptomatic but completely ambulatory  BP 135/68 (Patient Position: Sitting)   Pulse 98   Temp 97.8 F (36.6 C) (Tympanic)   Resp 20   Ht 5\' 2"  (1.575 m)   Wt 149 lb (67.6 kg) Comment: 20 lb weight loss  BMI 27.25 kg/m   Filed Weights   06/18/17 1340  Weight: 149 lb (67.6 kg)    GENERAL: Well-nourished well-developed; Alert, no distress and comfortable.  Accompanied by sister. EYES: no pallor or icterus OROPHARYNX: no thrush or ulceration; NECK: supple; no lymph nodes felt. LYMPH:  no palpable lymphadenopathy in the axillary or inguinal regions LUNGS: Decreased breath sounds auscultation bilaterally. No wheeze or crackles HEART/CVS: regular rate & rhythm and no murmurs; bilateral 1+ lower extremity edema ABDOMEN:abdomen soft, non-tender and normal bowel sounds. No hepatomegaly or splenomegaly.  Musculoskeletal:no cyanosis of digits and no clubbing  PSYCH: alert & oriented x 3 with fluent speech NEURO: no focal motor/sensory deficits SKIN:  no rashes or significant lesions    LABORATORY DATA:  I have reviewed the data as listed    Component Value Date/Time   NA 137 06/18/2017 1328   NA 133 (L) 05/08/2014 1349   K 3.5 06/18/2017 1328   K 3.6 05/08/2014 1349   CL 105 06/18/2017 1328   CL 102 05/08/2014 1349   CO2 23 06/18/2017 1328   CO2 26 05/08/2014 1349   GLUCOSE 117 (H) 06/18/2017 1328   GLUCOSE 82 05/08/2014 1349   BUN 14 06/18/2017 1328   BUN 18 05/08/2014 1349   CREATININE 0.83 06/18/2017 1328   CREATININE 1.13 (H) 05/08/2014 1349   CALCIUM 9.5 06/18/2017 1328   CALCIUM 9.1 05/08/2014 1349   PROT 7.0 06/18/2017 1328   PROT 6.7 05/08/2014 1349   ALBUMIN 4.5 06/18/2017 1328   ALBUMIN 4.2 05/08/2014 1349   AST 31 06/18/2017 1328   AST 24 05/08/2014 1349   ALT 18 06/18/2017 1328   ALT 13 (L) 05/08/2014 1349    ALKPHOS 37 (L) 06/18/2017 1328   ALKPHOS 41 05/08/2014 1349   BILITOT 1.2 06/18/2017 1328   BILITOT 1.3 (H) 05/08/2014 1349   GFRNONAA >60 06/18/2017 1328   GFRNONAA 45 (L) 05/08/2014 1349   GFRAA >60 06/18/2017 1328   GFRAA 52 (L) 05/08/2014 1349    No results found for: SPEP, UPEP  Lab Results  Component Value Date   WBC 2.5 (L) 06/18/2017   NEUTROABS 0.8 (L) 06/18/2017   HGB 14.4 06/18/2017   HCT 42.2 06/18/2017   MCV 88.7 06/18/2017   PLT 111 (L) 06/18/2017      Chemistry      Component Value Date/Time   NA 137 06/18/2017 1328   NA 133 (L) 05/08/2014 1349   K 3.5 06/18/2017 1328   K 3.6 05/08/2014 1349   CL 105 06/18/2017 1328   CL 102 05/08/2014 1349   CO2 23 06/18/2017 1328  CO2 26 05/08/2014 1349   BUN 14 06/18/2017 1328   BUN 18 05/08/2014 1349   CREATININE 0.83 06/18/2017 1328   CREATININE 1.13 (H) 05/08/2014 1349      Component Value Date/Time   CALCIUM 9.5 06/18/2017 1328   CALCIUM 9.1 05/08/2014 1349   ALKPHOS 37 (L) 06/18/2017 1328   ALKPHOS 41 05/08/2014 1349   AST 31 06/18/2017 1328   AST 24 05/08/2014 1349   ALT 18 06/18/2017 1328   ALT 13 (L) 05/08/2014 1349   BILITOT 1.2 06/18/2017 1328   BILITOT 1.3 (H) 05/08/2014 1349       RADIOGRAPHIC STUDIES: I have personally reviewed the radiological images as listed and agreed with the findings in the report. No results found.   ASSESSMENT & PLAN:  Chronic lymphocytic leukemia ([place]) #CLL on ibrutinib 280 mg/day [march 2016]; last CT scan January 2018 shows near CR.  However today-significant weight loss of 20 points noted-concerning for progression.  We will plan to get a CT scan chest and pelvis for further evaluation.  Patient agreeable.  #Continue ibrutinib for now.   #Thrombocytopenia 108 stable asymptomatic.Marland Kitchen  #Weight loss of approximately 20 pounds.  New; see discussion above.  #Hypertension-secondary to ibrutinib.  Stable.  See discussion above  # follow up in 6 weeks/labs-CTs  prior/MD.   CC: Dr.Tate.    Orders Placed This Encounter  Procedures  . CT CHEST W CONTRAST    Standing Status:   Future    Standing Expiration Date:   06/19/2018    Order Specific Question:   If indicated for the ordered procedure, I authorize the administration of contrast media per Radiology protocol    Answer:   Yes    Order Specific Question:   Preferred imaging location?    Answer:   [facility]    Order Specific Question:   Radiology Contrast Protocol - do NOT remove file path    Answer:   [email or web address]  . CT Abdomen Pelvis W Contrast    Standing Status:   Future    Standing Expiration Date:   06/18/2018    Order Specific Question:   If indicated for the ordered procedure, I authorize the administration of contrast media per Radiology protocol    Answer:   Yes    Order Specific Question:   Preferred imaging location?    Answer:   [facility]    Order Specific Question:   Is Oral Contrast requested for this exam?    Answer:   Yes, Per Radiology protocol    Order Specific Question:   Radiology Contrast Protocol - do NOT remove file path    Answer:   \\[email or web address]   All questions were answered. The patient knows to call the clinic with any problems, questions or concerns.      Cammie Sickle, MD 06/20/2017 11:31 AM

## 2017-06-18 NOTE — Assessment & Plan Note (Addendum)
#  CLL on ibrutinib 280 mg/day [march 2016]; last CT scan January 2018 shows near CR.  However today-significant weight loss of 20 points noted-concerning for progression.  We will plan to get a CT scan chest and pelvis for further evaluation.  Patient agreeable.  #Continue ibrutinib for now.   #Thrombocytopenia 108 stable asymptomatic.Marland Kitchen  #Weight loss of approximately 20 pounds.  New; see discussion above.  #Hypertension-secondary to ibrutinib.  Stable.  See discussion above  # follow up in 6 weeks/labs-CTs prior/MD.   CC: Dr.Tate.

## 2017-06-28 ENCOUNTER — Other Ambulatory Visit: Payer: Self-pay | Admitting: Internal Medicine

## 2017-06-28 DIAGNOSIS — C9110 Chronic lymphocytic leukemia of B-cell type not having achieved remission: Secondary | ICD-10-CM

## 2017-07-02 MED FILL — IMBRUVICA 280 MG TAB: 280 | 28 days supply | Qty: 28 | Fill #0

## 2017-07-28 ENCOUNTER — Ambulatory Visit
Admission: RE | Admit: 2017-07-28 | Discharge: 2017-07-28 | Disposition: A | Discharge status: Home / Self Care | Payer: Medicare Other | Source: Ambulatory Visit | Attending: Internal Medicine | Admitting: Internal Medicine

## 2017-07-28 DIAGNOSIS — C9190 Lymphoid leukemia, unspecified not having achieved remission: Secondary | ICD-10-CM | POA: Insufficient documentation

## 2017-07-28 DIAGNOSIS — I700 Atherosclerosis of aorta: Secondary | ICD-10-CM | POA: Insufficient documentation

## 2017-07-28 DIAGNOSIS — R161 Splenomegaly, not elsewhere classified: Secondary | ICD-10-CM | POA: Insufficient documentation

## 2017-07-28 DIAGNOSIS — C9110 Chronic lymphocytic leukemia of B-cell type not having achieved remission: Secondary | ICD-10-CM

## 2017-07-28 MED ORDER — IOPAMIDOL (ISOVUE-300) INJECTION 61%
100.0000 mL | Freq: Once | INTRAVENOUS | Status: AC | PRN
  Administered 2017-07-28: 100 mL via INTRAVENOUS

## 2017-07-30 ENCOUNTER — Inpatient Hospital Stay (HOSPITAL_BASED_OUTPATIENT_CLINIC_OR_DEPARTMENT_OTHER): Payer: Medicare Other | Admitting: Internal Medicine

## 2017-07-30 ENCOUNTER — Inpatient Hospital Stay: Payer: Medicare Other | Attending: Internal Medicine

## 2017-07-30 ENCOUNTER — Other Ambulatory Visit: Payer: Self-pay

## 2017-07-30 VITALS — BP 167/72 | HR 99 | Temp 97.9°F | Resp 20 | Ht 62.0 in | Wt 147.0 lb

## 2017-07-30 DIAGNOSIS — M7989 Other specified soft tissue disorders: Secondary | ICD-10-CM

## 2017-07-30 DIAGNOSIS — R5383 Other fatigue: Secondary | ICD-10-CM | POA: Insufficient documentation

## 2017-07-30 DIAGNOSIS — I10 Essential (primary) hypertension: Secondary | ICD-10-CM | POA: Insufficient documentation

## 2017-07-30 DIAGNOSIS — Z79899 Other long term (current) drug therapy: Secondary | ICD-10-CM | POA: Insufficient documentation

## 2017-07-30 DIAGNOSIS — R634 Abnormal weight loss: Secondary | ICD-10-CM | POA: Insufficient documentation

## 2017-07-30 DIAGNOSIS — C9110 Chronic lymphocytic leukemia of B-cell type not having achieved remission: Secondary | ICD-10-CM

## 2017-07-30 LAB — CBC WITH DIFFERENTIAL/PLATELET
BASOS ABS: 0 10*3/uL (ref 0–0.1)
Basophils Relative: 0 %
Eosinophils Absolute: 0 10*3/uL (ref 0–0.7)
Eosinophils Relative: 1 %
HEMATOCRIT: 42.5 % (ref 35.0–47.0)
Hemoglobin: 14.6 g/dL (ref 12.0–16.0)
Lymphocytes Relative: 54 %
Lymphs Abs: 1.3 10*3/uL (ref 1.0–3.6)
MCH: 30.5 pg (ref 26.0–34.0)
MCHC: 34.5 g/dL (ref 32.0–36.0)
MCV: 88.4 fL (ref 80.0–100.0)
Monocytes Absolute: 0.3 10*3/uL (ref 0.2–0.9)
Monocytes Relative: 14 %
Neutro Abs: 0.7 10*3/uL — ABNORMAL LOW (ref 1.4–6.5)
Neutrophils Relative %: 31 %
Platelets: 112 10*3/uL — ABNORMAL LOW (ref 150–440)
RBC: 4.81 MIL/uL (ref 3.80–5.20)
RDW: 14 % (ref 11.5–14.5)
WBC: 2.3 10*3/uL — AB (ref 3.6–11.0)

## 2017-07-30 LAB — LACTATE DEHYDROGENASE: LDH: 232 U/L — ABNORMAL HIGH (ref 98–192)

## 2017-07-30 LAB — COMPREHENSIVE METABOLIC PANEL
ALT: 18 U/L (ref 0–44)
AST: 28 U/L (ref 15–41)
Albumin: 4.2 g/dL (ref 3.5–5.0)
Alkaline Phosphatase: 38 U/L (ref 38–126)
Anion gap: 9 (ref 5–15)
BILIRUBIN TOTAL: 1.5 mg/dL — AB (ref 0.3–1.2)
BUN: 10 mg/dL (ref 8–23)
CO2: 23 mmol/L (ref 22–32)
CREATININE: 0.94 mg/dL (ref 0.44–1.00)
Calcium: 9.2 mg/dL (ref 8.9–10.3)
Chloride: 107 mmol/L (ref 98–111)
GFR calc Af Amer: >60 mL/min (ref >60)
GFR, EST NON AFRICAN AMERICAN: 53 mL/min — AB (ref >60)
Glucose, Bld: 108 mg/dL — ABNORMAL HIGH (ref 70–99)
Potassium: 3.7 mmol/L (ref 3.5–5.1)
Sodium: 139 mmol/L (ref 135–145)
TOTAL PROTEIN: 6.5 g/dL (ref 6.5–8.1)

## 2017-07-30 LAB — TSH: TSH: 4.093 u[IU]/mL (ref 0.350–4.500)

## 2017-07-30 NOTE — Assessment & Plan Note (Addendum)
#  CLL on ibrutinib 280 mg/day [march 2016];July 2019- CT c/a/p- slight increase in spleen size/ slight increase in portocval LN [by 56mm]- see discussion below.   # on Ibrutinib 280 mg/day; tolerating well except for Thrombocytopenia 112/neutropenia ANC 700 ? Weight los. HOLD off irbutinib for 1 months; will re-evaluate in 1 months. If counts improve; then will adjust dose of ibrutinib. If counts not better- then might need Bone marrow Bx vs steroids.   # Weight loss of approximately 20 pounds.  Unclear etiology. THS- Normal; ? Ibrutinib.  See above.   #Hypertension-secondary to ibrutinib; STABLE; better at home.   # follow up in 4 weeks/cbc/bmp to re-asses to start ibrutinib.  # I reviewed the blood work- with the patient in detail; also reviewed the imaging independently [as summarized above]; and with the patient in detail.    CC: Dr.Tate.

## 2017-07-30 NOTE — Progress Notes (Signed)
[facility] OFFICE PROGRESS NOTE  Patient Care Team: Albina Billet, MD as PCP - General (Internal Medicine)  Cancer Staging No matching staging information was found for the patient.   Oncology History   1. Ultrasound revealed abdominal lymphadenopathy(March, 2013) 2. Biopsy as well as peripheral blood be suggestive of chronic lymphocytic leukemia (April, 2013) 3. Started on bendamustin and Rituxan from May of 2013 4. Has finished her 6 cycles of chemotherapy with Rituxan and bendamustine (October 26, 2011) 5.recurrent and progressive disease with progressive anemia.  Patient was started on IBRUTANIB (March, 2016)  taking 140 mg 3 pills a day  6. Patient was taken off IBRUTANIB in August of 2016 because of persistent myelosuppression even with reducing dose 7. Because of rising WBC count and anemia patient would be started back on a lower dose of improved on ibrutanib 140 mg 2 tablets a day -----------------------------------------------------   DIAGNOSIS: [ ]  CLL  STAGE: 4      ;GOALS: Control  CURRENT/MOST RECENT THERAPY [2016] ibrutinib      Chronic lymphocytic leukemia ([place])   06/06/2014 Initial Diagnosis    Chronic lymphocytic leukemia         INTERVAL HISTORY:  Heather [name] 84 y.o.  female pleasant patient above history of SLL/CLL currently on ibrutinib is here for follow-up/reviewed the results of her CT scans.   Patient continues to lose weight.  States her appetite is overall good.  Complains of fatigue.  No fever chills.  Chronic mild swelling of the legs.  She does feel dizzy at times when she takes a blood pressure  Medications.   Review of Systems  Constitutional: Positive for malaise/fatigue and weight loss. Negative for chills, diaphoresis and fever.  HENT: Negative for nosebleeds and sore throat.   Eyes: Negative for double vision.  Respiratory: Negative for cough, hemoptysis, sputum production, shortness of breath and wheezing.    Cardiovascular: Positive for leg swelling. Negative for chest pain, palpitations and orthopnea.  Gastrointestinal: Negative for abdominal pain, blood in stool, constipation, diarrhea, heartburn, melena, nausea and vomiting.  Genitourinary: Negative for dysuria, frequency and urgency.  Musculoskeletal: Negative for back pain and joint pain.  Skin: Negative.  Negative for itching and rash.  Neurological: Negative for dizziness, tingling, focal weakness, weakness and headaches.  Endo/Heme/Allergies: Does not bruise/bleed easily.  Psychiatric/Behavioral: Negative for depression. The patient is not nervous/anxious and does not have insomnia.       PAST MEDICAL HISTORY :  Past Medical History:  Diagnosis Date  . Anemia   . Cataracts, bilateral   . Chronic lymphocytic leukemia ([place]) 06/06/2014  . Hearing loss   . History of chemotherapy   . Hyperlipidemia   . Hypertension   . Hypothyroidism   . Palpitations   . Risk for falls   . Thrombocytopenia ([place])     PAST SURGICAL HISTORY :   Past Surgical History:  Procedure Laterality Date  . CATARACT EXTRACTION Left 03/29/2012  . Excision left cervical node biopsy  05/04/2011    FAMILY HISTORY :   Family History  Problem Relation Age of Onset  . Heart disease Mother   . Stomach cancer Father   . Diabetes Sister     SOCIAL HISTORY:   Social History   Tobacco Use  . Smoking status: Never Smoker  . Smokeless tobacco: Never Used  Substance Use Topics  . Alcohol use: No    Alcohol/week: 0.0 oz  . Drug use: No    ALLERGIES:  has  No Known Allergies.  MEDICATIONS:  Current Outpatient Medications  Medication Sig Dispense Refill  . hydrALAZINE (APRESOLINE) 25 MG tablet TAKE 1 TABLET (25 MG TOTAL) BY MOUTH 2 (TWO) TIMES DAILY. 60 tablet 4  . IMBRUVICA 280 MG TABS TAKE 1 TABLET BY MOUTH DAILY. 28 tablet 6  . levothyroxine (SYNTHROID, LEVOTHROID) 75 MCG tablet Take 75 mcg by mouth daily before breakfast. Only takes Saturday-  Thursday. Hold synthroid on Friday    . losartan (COZAAR) 100 MG tablet Take 100 mg by mouth daily.  3  . Potassium 99 MG TABS Take 1 tablet by mouth daily.     No current facility-administered medications for this visit.     PHYSICAL EXAMINATION: ECOG PERFORMANCE STATUS: 1 - Symptomatic but completely ambulatory  BP (!) 167/72 (BP Location: Left Arm, Patient Position: Sitting)   Pulse 99   Temp 97.9 F (36.6 C) (Oral)   Resp 20   Ht 5\' 2"  (1.575 m)   Wt 147 lb (66.7 kg)   BMI 26.89 kg/m   Filed Weights   07/30/17 1031  Weight: 147 lb (66.7 kg)    GENERAL: Well-nourished well-developed; Alert, no distress and comfortable.  Accompanied by family.  Walks with a walker. EYES: no pallor or icterus OROPHARYNX: no thrush or ulceration; NECK: supple; no lymph nodes felt. LYMPH:  no palpable lymphadenopathy in the axillary or inguinal regions LUNGS: Decreased breath sounds auscultation bilaterally. No wheeze or crackles HEART/CVS: regular rate & rhythm and no murmurs; No lower extremity edema ABDOMEN:abdomen soft, non-tender and normal bowel sounds. No hepatomegaly or splenomegaly.  Musculoskeletal:no cyanosis of digits and no clubbing  PSYCH: alert & oriented x 3 with fluent speech NEURO: no focal motor/sensory deficits SKIN:  no rashes or significant lesions    LABORATORY DATA:  I have reviewed the data as listed    Component Value Date/Time   NA 139 07/30/2017 1013   NA 133 (L) 05/08/2014 1349   K 3.7 07/30/2017 1013   K 3.6 05/08/2014 1349   CL 107 07/30/2017 1013   CL 102 05/08/2014 1349   CO2 23 07/30/2017 1013   CO2 26 05/08/2014 1349   GLUCOSE 108 (H) 07/30/2017 1013   GLUCOSE 82 05/08/2014 1349   BUN 10 07/30/2017 1013   BUN 18 05/08/2014 1349   CREATININE 0.94 07/30/2017 1013   CREATININE 1.13 (H) 05/08/2014 1349   CALCIUM 9.2 07/30/2017 1013   CALCIUM 9.1 05/08/2014 1349   PROT 6.5 07/30/2017 1013   PROT 6.7 05/08/2014 1349   ALBUMIN 4.2 07/30/2017  1013   ALBUMIN 4.2 05/08/2014 1349   AST 28 07/30/2017 1013   AST 24 05/08/2014 1349   ALT 18 07/30/2017 1013   ALT 13 (L) 05/08/2014 1349   ALKPHOS 38 07/30/2017 1013   ALKPHOS 41 05/08/2014 1349   BILITOT 1.5 (H) 07/30/2017 1013   BILITOT 1.3 (H) 05/08/2014 1349   GFRNONAA 53 (L) 07/30/2017 1013   GFRNONAA 45 (L) 05/08/2014 1349   GFRAA >60 07/30/2017 1013   GFRAA 52 (L) 05/08/2014 1349    No results found for: SPEP, UPEP  Lab Results  Component Value Date   WBC 2.3 (L) 07/30/2017   NEUTROABS 0.7 (L) 07/30/2017   HGB 14.6 07/30/2017   HCT 42.5 07/30/2017   MCV 88.4 07/30/2017   PLT 112 (L) 07/30/2017      Chemistry      Component Value Date/Time   NA 139 07/30/2017 1013   NA 133 (L) 05/08/2014 1349  K 3.7 07/30/2017 1013   K 3.6 05/08/2014 1349   CL 107 07/30/2017 1013   CL 102 05/08/2014 1349   CO2 23 07/30/2017 1013   CO2 26 05/08/2014 1349   BUN 10 07/30/2017 1013   BUN 18 05/08/2014 1349   CREATININE 0.94 07/30/2017 1013   CREATININE 1.13 (H) 05/08/2014 1349      Component Value Date/Time   CALCIUM 9.2 07/30/2017 1013   CALCIUM 9.1 05/08/2014 1349   ALKPHOS 38 07/30/2017 1013   ALKPHOS 41 05/08/2014 1349   AST 28 07/30/2017 1013   AST 24 05/08/2014 1349   ALT 18 07/30/2017 1013   ALT 13 (L) 05/08/2014 1349   BILITOT 1.5 (H) 07/30/2017 1013   BILITOT 1.3 (H) 05/08/2014 1349       RADIOGRAPHIC STUDIES: I have personally reviewed the radiological images as listed and agreed with the findings in the report. Ct Chest W Contrast  Result Date: 07/28/2017 CLINICAL DATA:  Chronic lymphocytic leukemia. EXAM: CT CHEST, ABDOMEN, AND PELVIS WITH CONTRAST TECHNIQUE: Multidetector CT imaging of the chest, abdomen and pelvis was performed following the standard protocol during bolus administration of intravenous contrast. CONTRAST:  132mL ISOVUE-300 IOPAMIDOL (ISOVUE-300) INJECTION 61% COMPARISON:  02/11/2016 FINDINGS: CT CHEST FINDINGS Cardiovascular: Normal  heart size. No pericardial effusion. Aortic atherosclerosis noted. Again noted is increase caliber of the main pulmonary artery as an measuring 3.1 cm, image 26/2. Mediastinum/Nodes: Normal appearance of the thyroid gland. The trachea appears patent and is midline. Normal appearance of the esophagus. No enlarged axillary, supraclavicular, mediastinal or hilar adenopathy. Lungs/Pleura: No pleural effusions. Mild scarring noted within the lung bases. No airspace consolidation, atelectasis or pneumothorax. No suspicious pulmonary nodule or mass. Musculoskeletal: Degenerative disc disease noted within the thoracic spine. No aggressive lytic or sclerotic bone lesions. CT ABDOMEN PELVIS FINDINGS Hepatobiliary: Similar appearance of right lobe of liver cyst measuring 1 cm, image 46/2. No suspicious liver abnormalities. Large Weesner within the gallbladder measuring 3 cm is again identified. No biliary dilatation. Pancreas: Unremarkable. No pancreatic ductal dilatation or surrounding inflammatory changes. Spleen: The spleen is enlarged measuring 13.9 cm in length. Previously this measured 12.6 cm in length. Multifocal splenic lesions are again noted. The largest measures 4.8 cm and is unchanged from previous exam. Adrenals/Urinary Tract: Normal appearance of the adrenal glands. Indeterminate hypoenhancing lesion within the posteromedial cortex of the upper pole of right kidney is unchanged measuring 1.1 cm. No hydronephrosis identified. Urinary bladder appears normal. Stomach/Bowel: Stomach normal. The small bowel loops have a normal course and caliber without obstruction. Colonic diverticulosis is noted without acute inflammation. Vascular/Lymphatic: Aortic atherosclerosis. No aneurysm. Portacaval node measures 1.4 cm, image 57/2. Previously 1.3 cm. No enlarged pelvic or inguinal lymph nodes. Reproductive: Uterus and bilateral adnexa are unremarkable. Other: No free fluid or fluid collections. Musculoskeletal: Spondylosis  identified within the lumbar spine. No aggressive lytic or sclerotic bone lesions. IMPRESSION: 1. Persistent splenomegaly. Mildly increased in size from previous exam. The 2 splenic lesions are stable in the interval. 2. Single portacaval lymph node is borderline enlarged, slightly increased in size in the interval. No other sites of adenopathy identified within the chest, abdomen or pelvis. 3.  Aortic Atherosclerosis (ICD10-I70.0). 4. Increase caliber of the main pulmonary artery which may be seen with PA hypertension. Electronically Signed   By: Kerby Moors M.D.   On: 07/28/2017 13:38   Ct Abdomen Pelvis W Contrast  Result Date: 07/28/2017 CLINICAL DATA:  Chronic lymphocytic leukemia. EXAM: CT CHEST, ABDOMEN, AND PELVIS WITH  CONTRAST TECHNIQUE: Multidetector CT imaging of the chest, abdomen and pelvis was performed following the standard protocol during bolus administration of intravenous contrast. CONTRAST:  136mL ISOVUE-300 IOPAMIDOL (ISOVUE-300) INJECTION 61% COMPARISON:  02/11/2016 FINDINGS: CT CHEST FINDINGS Cardiovascular: Normal heart size. No pericardial effusion. Aortic atherosclerosis noted. Again noted is increase caliber of the main pulmonary artery as an measuring 3.1 cm, image 26/2. Mediastinum/Nodes: Normal appearance of the thyroid gland. The trachea appears patent and is midline. Normal appearance of the esophagus. No enlarged axillary, supraclavicular, mediastinal or hilar adenopathy. Lungs/Pleura: No pleural effusions. Mild scarring noted within the lung bases. No airspace consolidation, atelectasis or pneumothorax. No suspicious pulmonary nodule or mass. Musculoskeletal: Degenerative disc disease noted within the thoracic spine. No aggressive lytic or sclerotic bone lesions. CT ABDOMEN PELVIS FINDINGS Hepatobiliary: Similar appearance of right lobe of liver cyst measuring 1 cm, image 46/2. No suspicious liver abnormalities. Large Kreisler within the gallbladder measuring 3 cm is again  identified. No biliary dilatation. Pancreas: Unremarkable. No pancreatic ductal dilatation or surrounding inflammatory changes. Spleen: The spleen is enlarged measuring 13.9 cm in length. Previously this measured 12.6 cm in length. Multifocal splenic lesions are again noted. The largest measures 4.8 cm and is unchanged from previous exam. Adrenals/Urinary Tract: Normal appearance of the adrenal glands. Indeterminate hypoenhancing lesion within the posteromedial cortex of the upper pole of right kidney is unchanged measuring 1.1 cm. No hydronephrosis identified. Urinary bladder appears normal. Stomach/Bowel: Stomach normal. The small bowel loops have a normal course and caliber without obstruction. Colonic diverticulosis is noted without acute inflammation. Vascular/Lymphatic: Aortic atherosclerosis. No aneurysm. Portacaval node measures 1.4 cm, image 57/2. Previously 1.3 cm. No enlarged pelvic or inguinal lymph nodes. Reproductive: Uterus and bilateral adnexa are unremarkable. Other: No free fluid or fluid collections. Musculoskeletal: Spondylosis identified within the lumbar spine. No aggressive lytic or sclerotic bone lesions. IMPRESSION: 1. Persistent splenomegaly. Mildly increased in size from previous exam. The 2 splenic lesions are stable in the interval. 2. Single portacaval lymph node is borderline enlarged, slightly increased in size in the interval. No other sites of adenopathy identified within the chest, abdomen or pelvis. 3.  Aortic Atherosclerosis (ICD10-I70.0). 4. Increase caliber of the main pulmonary artery which may be seen with PA hypertension. Electronically Signed   By: Kerby Moors M.D.   On: 07/28/2017 13:38    IMPRESSION: 1. Persistent splenomegaly. Mildly increased in size from previous exam. The 2 splenic lesions are stable in the interval. 2. Single portacaval lymph node is borderline enlarged, slightly increased in size in the interval. No other sites of adenopathy identified  within the chest, abdomen or pelvis. 3.  Aortic Atherosclerosis (ICD10-I70.0). 4. Increase caliber of the main pulmonary artery which may be seen with PA hypertension.   Electronically Signed   By: Kerby Moors M.D.   On: 07/28/2017 13:38  ASSESSMENT & PLAN:  Chronic lymphocytic leukemia ([place]) #CLL on ibrutinib 280 mg/day [march 2016];July 2019- CT c/a/p- slight increase in spleen size/ slight increase in portocval LN [by 59mm]- see discussion below.   # on Ibrutinib 280 mg/day; tolerating well except for Thrombocytopenia 112/neutropenia ANC 700 ? Weight los. HOLD off irbutinib for 1 months; will re-evaluate in 1 months. If counts improve; then will adjust dose of ibrutinib. If counts not better- then might need Bone marrow Bx vs steroids.   # Weight loss of approximately 20 pounds.  Unclear etiology. THS- Normal; ? Ibrutinib.  See above.   #Hypertension-secondary to ibrutinib; STABLE; better at home.   #  follow up in 4 weeks/cbc/bmp to re-asses to start ibrutinib.  # I reviewed the blood work- with the patient in detail; also reviewed the imaging independently [as summarized above]; and with the patient in detail.    CC: Dr.Tate.    Orders Placed This Encounter  Procedures  . CBC with Differential    Standing Status:   Future    Number of Occurrences:   1    Standing Expiration Date:   07/31/2018  . Comprehensive metabolic panel    Standing Status:   Future    Number of Occurrences:   1    Standing Expiration Date:   07/31/2018  . Lactate dehydrogenase    Standing Status:   Future    Number of Occurrences:   1    Standing Expiration Date:   07/31/2018  . TSH    Standing Status:   Future    Number of Occurrences:   1    Standing Expiration Date:   07/31/2018  . CBC with Differential    Standing Status:   Future    Standing Expiration Date:   07/31/2018  . Basic metabolic panel    Standing Status:   Future    Standing Expiration Date:   07/31/2018   All questions were  answered. The patient knows to call the clinic with any problems, questions or concerns.      Cammie Sickle, MD 07/30/2017 11:02 AM

## 2017-07-30 NOTE — Patient Instructions (Signed)
#  Hold taking Imbruvica for the next 1 month/until next doctor visit

## 2017-08-02 MED FILL — IMBRUVICA 280 MG TAB: 280 | 28 days supply | Qty: 28 | Fill #1

## 2017-08-27 ENCOUNTER — Other Ambulatory Visit: Payer: Self-pay

## 2017-08-27 ENCOUNTER — Inpatient Hospital Stay (HOSPITAL_BASED_OUTPATIENT_CLINIC_OR_DEPARTMENT_OTHER): Payer: Medicare Other | Admitting: Internal Medicine

## 2017-08-27 ENCOUNTER — Telehealth: Payer: Self-pay | Admitting: Pharmacist

## 2017-08-27 ENCOUNTER — Inpatient Hospital Stay: Payer: Medicare Other | Attending: Internal Medicine

## 2017-08-27 VITALS — BP 152/69 | HR 105 | Temp 98.9°F | Resp 18 | Ht 62.0 in | Wt 143.0 lb

## 2017-08-27 DIAGNOSIS — C9110 Chronic lymphocytic leukemia of B-cell type not having achieved remission: Secondary | ICD-10-CM

## 2017-08-27 DIAGNOSIS — I10 Essential (primary) hypertension: Secondary | ICD-10-CM

## 2017-08-27 DIAGNOSIS — R634 Abnormal weight loss: Secondary | ICD-10-CM

## 2017-08-27 DIAGNOSIS — R0609 Other forms of dyspnea: Secondary | ICD-10-CM | POA: Diagnosis not present

## 2017-08-27 LAB — BASIC METABOLIC PANEL
ANION GAP: 10 (ref 5–15)
BUN: 12 mg/dL (ref 8–23)
CHLORIDE: 106 mmol/L (ref 98–111)
CO2: 21 mmol/L — AB (ref 22–32)
Calcium: 10.1 mg/dL (ref 8.9–10.3)
Creatinine, Ser: 1 mg/dL (ref 0.44–1.00)
GFR calc non Af Amer: 49 mL/min — ABNORMAL LOW (ref >60)
GFR, EST AFRICAN AMERICAN: 57 mL/min — AB (ref >60)
Glucose, Bld: 108 mg/dL — ABNORMAL HIGH (ref 70–99)
POTASSIUM: 3.7 mmol/L (ref 3.5–5.1)
Sodium: 137 mmol/L (ref 135–145)

## 2017-08-27 LAB — CBC WITH DIFFERENTIAL/PLATELET
Basophils Absolute: 0 10*3/uL (ref 0–0.1)
Basophils Relative: 0 %
Eosinophils Absolute: 0.1 10*3/uL (ref 0–0.7)
Eosinophils Relative: 2 %
HEMATOCRIT: 39.5 % (ref 35.0–47.0)
HEMOGLOBIN: 13.2 g/dL (ref 12.0–16.0)
LYMPHS ABS: 2 10*3/uL (ref 1.0–3.6)
LYMPHS PCT: 62 %
MCH: 29.2 pg (ref 26.0–34.0)
MCHC: 33.4 g/dL (ref 32.0–36.0)
MCV: 87.2 fL (ref 80.0–100.0)
Monocytes Absolute: 0.4 10*3/uL (ref 0.2–0.9)
Monocytes Relative: 11 %
NEUTROS ABS: 0.8 10*3/uL — AB (ref 1.4–6.5)
NEUTROS PCT: 25 %
Platelets: 98 10*3/uL — ABNORMAL LOW (ref 150–440)
RBC: 4.53 MIL/uL (ref 3.80–5.20)
RDW: 14.4 % (ref 11.5–14.5)
WBC: 3.3 10*3/uL — ABNORMAL LOW (ref 3.6–11.0)

## 2017-08-27 NOTE — Progress Notes (Signed)
[facility] [facility] OFFICE PROGRESS NOTE  Patient Care Team: Albina Billet, MD as PCP - General (Internal Medicine)  Cancer Staging No matching staging information was found for the patient.   Oncology History   1. Ultrasound revealed abdominal lymphadenopathy(March, 2013) 2. Biopsy as well as peripheral blood be suggestive of chronic lymphocytic leukemia (April, 2013) 3. Started on bendamustin and Rituxan from May of 2013 4. Has finished her 6 cycles of chemotherapy with Rituxan and bendamustine (October 26, 2011) 5.recurrent and progressive disease with progressive anemia.  Patient was started on IBRUTANIB (March, 2016)  taking 140 mg 3 pills a day  6. Patient was taken off IBRUTANIB in August of 2016 because of persistent myelosuppression even with reducing dose 7. Because of rising WBC count and anemia patient would be started back on a lower dose of improved on ibrutanib 140 mg 2 tablets a day -----------------------------------------------------   DIAGNOSIS: [ ]  CLL  STAGE: 4      ;GOALS: Control  CURRENT/MOST RECENT THERAPY [2016] ibrutinib      Chronic lymphocytic leukemia ([place])   06/06/2014 Initial Diagnosis    Chronic lymphocytic leukemia       INTERVAL HISTORY:  Heather [name] 84 y.o.  female pleasant patient above history of CLL on ibrutinib is here for follow-up.  Patient ibrutinib was held approximately 1 month given ongoing fatigue weight loss-concern for tolerance.   Patient denies any significant improvement of energy levels or loss of appetite off ibrutinib.  No nausea no vomiting.  No headaches.  Continues to complain of shortness of breath with exertion.  No cough.  No worsening swelling of the legs.  Review of Systems  Constitutional: Positive for malaise/fatigue and weight loss. Negative for chills, diaphoresis and fever.  HENT: Negative for nosebleeds and sore throat.   Eyes: Negative for double vision.  Respiratory: Positive for  shortness of breath (Shortness of breath only with exertion.). Negative for cough, hemoptysis, sputum production and wheezing.   Cardiovascular: Negative for chest pain, palpitations, orthopnea and leg swelling.  Gastrointestinal: Negative for abdominal pain, blood in stool, constipation, diarrhea, heartburn, melena, nausea and vomiting.  Genitourinary: Negative for dysuria, frequency and urgency.  Musculoskeletal: Negative for back pain and joint pain.  Skin: Negative.  Negative for itching and rash.  Neurological: Negative for dizziness, tingling, focal weakness, weakness and headaches.  Endo/Heme/Allergies: Does not bruise/bleed easily.  Psychiatric/Behavioral: Negative for depression. The patient is not nervous/anxious and does not have insomnia.       PAST MEDICAL HISTORY :  Past Medical History:  Diagnosis Date  . Anemia   . Cataracts, bilateral   . Chronic lymphocytic leukemia ([place]) 06/06/2014  . Hearing loss   . History of chemotherapy   . Hyperlipidemia   . Hypertension   . Hypothyroidism   . Palpitations   . Risk for falls   . Thrombocytopenia ([place])     PAST SURGICAL HISTORY :   Past Surgical History:  Procedure Laterality Date  . CATARACT EXTRACTION Left 03/29/2012  . Excision left cervical node biopsy  05/04/2011    FAMILY HISTORY :   Family History  Problem Relation Age of Onset  . Heart disease Mother   . Stomach cancer Father   . Diabetes Sister     SOCIAL HISTORY:   Social History   Tobacco Use  . Smoking status: Never Smoker  . Smokeless tobacco: Never Used  Substance Use Topics  . Alcohol use: No    Alcohol/week: 0.0 standard  drinks  . Drug use: No    ALLERGIES:  has No Known Allergies.  MEDICATIONS:  Current Outpatient Medications  Medication Sig Dispense Refill  . hydrALAZINE (APRESOLINE) 25 MG tablet TAKE 1 TABLET (25 MG TOTAL) BY MOUTH 2 (TWO) TIMES DAILY. 60 tablet 4  . levothyroxine (SYNTHROID, LEVOTHROID) 75 MCG tablet Take 75 mcg  by mouth daily before breakfast. Only takes Saturday- Thursday. Hold synthroid on Friday    . losartan (COZAAR) 100 MG tablet Take 100 mg by mouth daily.    . IMBRUVICA 280 MG TABS TAKE 1 TABLET BY MOUTH DAILY. (Patient not taking: Reported on 08/27/2017) 28 tablet 6   No current facility-administered medications for this visit.     PHYSICAL EXAMINATION: ECOG PERFORMANCE STATUS: 2 - Symptomatic, <50% confined to bed  BP (!) 152/69   Pulse (!) 105   Temp 98.9 F (37.2 C) (Tympanic)   Resp 18   Ht 5\' 2"  (1.575 m)   Wt 143 lb (64.9 kg)   BMI 26.16 kg/m   Filed Weights   08/27/17 1108  Weight: 143 lb (64.9 kg)    GENERAL: Well-nourished well-developed; Alert, no distress and comfortable.  Accompanied by her sister.  She walks with a walker.  EYES: no pallor or icterus OROPHARYNX: no thrush or ulceration; NECK: supple; no lymph nodes felt. LYMPH:  no palpable lymphadenopathy in the axillary or inguinal regions LUNGS: Decreased breath sounds auscultation bilaterally. No wheeze or crackles HEART/CVS: regular rate & rhythm and no murmurs; No lower extremity edema ABDOMEN:abdomen soft, non-tender and normal bowel sounds. No hepatomegaly or splenomegaly.  Musculoskeletal:no cyanosis of digits and no clubbing  PSYCH: alert & oriented x 3 with fluent speech NEURO: no focal motor/sensory deficits SKIN:  no rashes or significant lesions    LABORATORY DATA:  I have reviewed the data as listed    Component Value Date/Time   NA 137 08/27/2017 1044   NA 133 (L) 05/08/2014 1349   K 3.7 08/27/2017 1044   K 3.6 05/08/2014 1349   CL 106 08/27/2017 1044   CL 102 05/08/2014 1349   CO2 21 (L) 08/27/2017 1044   CO2 26 05/08/2014 1349   GLUCOSE 108 (H) 08/27/2017 1044   GLUCOSE 82 05/08/2014 1349   BUN 12 08/27/2017 1044   BUN 18 05/08/2014 1349   CREATININE 1.00 08/27/2017 1044   CREATININE 1.13 (H) 05/08/2014 1349   CALCIUM 10.1 08/27/2017 1044   CALCIUM 9.1 05/08/2014 1349   PROT 6.5  07/30/2017 1013   PROT 6.7 05/08/2014 1349   ALBUMIN 4.2 07/30/2017 1013   ALBUMIN 4.2 05/08/2014 1349   AST 28 07/30/2017 1013   AST 24 05/08/2014 1349   ALT 18 07/30/2017 1013   ALT 13 (L) 05/08/2014 1349   ALKPHOS 38 07/30/2017 1013   ALKPHOS 41 05/08/2014 1349   BILITOT 1.5 (H) 07/30/2017 1013   BILITOT 1.3 (H) 05/08/2014 1349   GFRNONAA 49 (L) 08/27/2017 1044   GFRNONAA 45 (L) 05/08/2014 1349   GFRAA 57 (L) 08/27/2017 1044   GFRAA 52 (L) 05/08/2014 1349    No results found for: SPEP, UPEP  Lab Results  Component Value Date   WBC 3.3 (L) 08/27/2017   NEUTROABS 0.8 (L) 08/27/2017   HGB 13.2 08/27/2017   HCT 39.5 08/27/2017   MCV 87.2 08/27/2017   PLT 98 (L) 08/27/2017      Chemistry      Component Value Date/Time   NA 137 08/27/2017 1044   NA 133 (L)  05/08/2014 1349   K 3.7 08/27/2017 1044   K 3.6 05/08/2014 1349   CL 106 08/27/2017 1044   CL 102 05/08/2014 1349   CO2 21 (L) 08/27/2017 1044   CO2 26 05/08/2014 1349   BUN 12 08/27/2017 1044   BUN 18 05/08/2014 1349   CREATININE 1.00 08/27/2017 1044   CREATININE 1.13 (H) 05/08/2014 1349      Component Value Date/Time   CALCIUM 10.1 08/27/2017 1044   CALCIUM 9.1 05/08/2014 1349   ALKPHOS 38 07/30/2017 1013   ALKPHOS 41 05/08/2014 1349   AST 28 07/30/2017 1013   AST 24 05/08/2014 1349   ALT 18 07/30/2017 1013   ALT 13 (L) 05/08/2014 1349   BILITOT 1.5 (H) 07/30/2017 1013   BILITOT 1.3 (H) 05/08/2014 1349       RADIOGRAPHIC STUDIES: I have personally reviewed the radiological images as listed and agreed with the findings in the report. No results found.   ASSESSMENT & PLAN:  Chronic lymphocytic leukemia ([place]) #CLL-July 2019- CT c/a/p- slight increase in spleen size/ slight increase in portocval LN [by 40mm].  Question progression on ibrutinib.  #Ibrutinib was held for approximately 1 month-but no improvement in fatigue or weight loss.  Recommend restarting ibrutinib daily.  #Discussed regarding PET  scan for further evaluation of the CLL especially in light of her continued thrombocytopenia/weight loss fatigue.  Patient reluctant.  Wants to scan in approximately 2 months.  # Weight loss of approximately 20 pounds.  TSH normal.  Question CLL versus others.  Await PET scan as discussed above.  #Hypertension-secondary to ibrutinib; stable; better at home.  # recommend PET scan- sooner/ vs 82months/ [pt preference]; re-start taking inbrutinib.    CC: Dr.Tate.    Orders Placed This Encounter  Procedures  . NM PET Image Restag (PS) Skull Base To Thigh    Standing Status:   Future    Standing Expiration Date:   08/27/2018    Order Specific Question:   ** REASON FOR EXAM (FREE TEXT)    Answer:   CLL/lymphoma on treatment    Order Specific Question:   If indicated for the ordered procedure, I authorize the administration of a radiopharmaceutical per Radiology protocol    Answer:   Yes    Order Specific Question:   Preferred imaging location?    Answer:   [facility]    Order Specific Question:   Radiology Contrast Protocol - do NOT remove file path    Answer:   [email or web address]   All questions were answered. The patient knows to call the clinic with any problems, questions or concerns.      Cammie Sickle, MD 08/29/2017 4:05 PM

## 2017-08-27 NOTE — Telephone Encounter (Signed)
Oral Chemotherapy Pharmacist Encounter  Follow-Up Form  Called patient today to follow up regarding patient's oral chemotherapy medication: Imbruvica (ibrutinib)  Original Start date of oral chemotherapy: 03/2014. She is restarting her medication today after a one month treatment break.  Patient knows to call the office with questions or concerns. Oral Oncology Clinic will continue to follow.  Darl Pikes, PharmD, BCPS, [facility] Hematology/Oncology Clinical Pharmacist [facility] [phone]  08/27/2017 3:34 PM

## 2017-08-27 NOTE — Assessment & Plan Note (Addendum)
#  CLL-July 2019- CT c/a/p- slight increase in spleen size/ slight increase in portocval LN [by 62mm].  Question progression on ibrutinib.  #Ibrutinib was held for approximately 1 month-but no improvement in fatigue or weight loss.  Recommend restarting ibrutinib daily.  #Discussed regarding PET scan for further evaluation of the CLL especially in light of her continued thrombocytopenia/weight loss fatigue.  Patient reluctant.  Wants to scan in approximately 2 months.  # Weight loss of approximately 20 pounds.  TSH normal.  Question CLL versus others.  Await PET scan as discussed above.  #Hypertension-secondary to ibrutinib; stable; better at home.  # recommend PET scan- sooner/ vs 50months/ [pt preference]; re-start taking inbrutinib.    CC: Dr.Tate.

## 2017-09-23 MED FILL — IMBRUVICA 280 MG TAB: 280 | 28 days supply | Qty: 28 | Fill #2

## 2017-10-01 ENCOUNTER — Other Ambulatory Visit: Payer: Self-pay | Admitting: Internal Medicine

## 2017-10-21 MED FILL — IMBRUVICA 280 MG TAB: 280 | 28 days supply | Qty: 28 | Fill #3

## 2017-10-27 ENCOUNTER — Encounter
Admission: RE | Admit: 2017-10-27 | Discharge: 2017-10-27 | Disposition: A | Discharge status: Home / Self Care | Payer: Medicare Other | Source: Ambulatory Visit | Attending: Internal Medicine | Admitting: Internal Medicine

## 2017-10-27 DIAGNOSIS — C9110 Chronic lymphocytic leukemia of B-cell type not having achieved remission: Secondary | ICD-10-CM | POA: Diagnosis present

## 2017-10-27 LAB — GLUCOSE, CAPILLARY: Glucose-Capillary: 79 mg/dL (ref 70–99)

## 2017-10-27 MED ORDER — FLUDEOXYGLUCOSE F - 18 (FDG) INJECTION
7.2000 | Freq: Once | INTRAVENOUS | Status: AC | PRN
Start: 2017-10-27 — End: 2017-10-27
  Administered 2017-10-27: 7.2 via INTRAVENOUS

## 2017-10-28 ENCOUNTER — Other Ambulatory Visit: Payer: Self-pay | Admitting: Internal Medicine

## 2017-10-28 DIAGNOSIS — C9110 Chronic lymphocytic leukemia of B-cell type not having achieved remission: Secondary | ICD-10-CM

## 2017-10-29 ENCOUNTER — Encounter: Payer: Self-pay | Admitting: Internal Medicine

## 2017-10-29 ENCOUNTER — Other Ambulatory Visit: Payer: Self-pay

## 2017-10-29 ENCOUNTER — Inpatient Hospital Stay (HOSPITAL_BASED_OUTPATIENT_CLINIC_OR_DEPARTMENT_OTHER): Payer: Medicare Other | Admitting: Internal Medicine

## 2017-10-29 ENCOUNTER — Inpatient Hospital Stay: Payer: Medicare Other | Attending: Internal Medicine

## 2017-10-29 VITALS — BP 188/62 | HR 93 | Temp 99.2°F | Resp 20 | Ht 62.0 in | Wt 144.0 lb

## 2017-10-29 DIAGNOSIS — R5383 Other fatigue: Secondary | ICD-10-CM

## 2017-10-29 DIAGNOSIS — Z79899 Other long term (current) drug therapy: Secondary | ICD-10-CM | POA: Insufficient documentation

## 2017-10-29 DIAGNOSIS — C9110 Chronic lymphocytic leukemia of B-cell type not having achieved remission: Secondary | ICD-10-CM | POA: Diagnosis present

## 2017-10-29 DIAGNOSIS — D709 Neutropenia, unspecified: Secondary | ICD-10-CM | POA: Diagnosis not present

## 2017-10-29 DIAGNOSIS — I10 Essential (primary) hypertension: Secondary | ICD-10-CM | POA: Insufficient documentation

## 2017-10-29 DIAGNOSIS — D696 Thrombocytopenia, unspecified: Secondary | ICD-10-CM

## 2017-10-29 DIAGNOSIS — R0609 Other forms of dyspnea: Secondary | ICD-10-CM

## 2017-10-29 LAB — COMPREHENSIVE METABOLIC PANEL
ALBUMIN: 4.3 g/dL (ref 3.5–5.0)
ALK PHOS: 30 U/L — AB (ref 38–126)
ALT: 17 U/L (ref 0–44)
AST: 34 U/L (ref 15–41)
Anion gap: 7 (ref 5–15)
BUN: 12 mg/dL (ref 8–23)
CALCIUM: 9.5 mg/dL (ref 8.9–10.3)
CHLORIDE: 106 mmol/L (ref 98–111)
CO2: 26 mmol/L (ref 22–32)
CREATININE: 0.93 mg/dL (ref 0.44–1.00)
GFR calc Af Amer: >60 mL/min (ref >60)
GFR, EST NON AFRICAN AMERICAN: 54 mL/min — AB (ref >60)
Glucose, Bld: 92 mg/dL (ref 70–99)
Potassium: 3.7 mmol/L (ref 3.5–5.1)
SODIUM: 139 mmol/L (ref 135–145)
Total Bilirubin: 1.3 mg/dL — ABNORMAL HIGH (ref 0.3–1.2)
Total Protein: 6.5 g/dL (ref 6.5–8.1)

## 2017-10-29 LAB — CBC WITH DIFFERENTIAL/PLATELET
Abs Immature Granulocytes: 0.03 10*3/uL (ref 0.00–0.07)
Basophils Absolute: 0 10*3/uL (ref 0.0–0.1)
Basophils Relative: 1 %
EOS ABS: 0 10*3/uL (ref 0.0–0.5)
Eosinophils Relative: 0 %
HEMATOCRIT: 42.9 % (ref 36.0–46.0)
HEMOGLOBIN: 14 g/dL (ref 12.0–15.0)
Immature Granulocytes: 1 %
LYMPHS ABS: 1.6 10*3/uL (ref 0.7–4.0)
LYMPHS PCT: 61 %
MCH: 28.9 pg (ref 26.0–34.0)
MCHC: 32.6 g/dL (ref 30.0–36.0)
MCV: 88.5 fL (ref 80.0–100.0)
MONO ABS: 0.3 10*3/uL (ref 0.1–1.0)
Monocytes Relative: 11 %
Neutro Abs: 0.7 10*3/uL — ABNORMAL LOW (ref 1.7–7.7)
Neutrophils Relative %: 26 %
Platelets: 90 10*3/uL — ABNORMAL LOW (ref 150–400)
RBC: 4.85 MIL/uL (ref 3.87–5.11)
RDW: 13.2 % (ref 11.5–15.5)
WBC: 2.6 10*3/uL — ABNORMAL LOW (ref 4.0–10.5)
nRBC: 0 % (ref 0.0–0.2)

## 2017-10-29 LAB — LACTATE DEHYDROGENASE: LDH: 280 U/L — AB (ref 98–192)

## 2017-10-29 NOTE — Assessment & Plan Note (Addendum)
#   CLL-on ibrutinib.  PET scan OCT 2019- moderate splenomegaly; slightly increased in size [compared to CT scan July 2019].  Overall clinically stable.  Continue ibrutinib.  # Neutropenia/thrombocytopenia [ ANC-700/ platelets- 90s];but Hb 14- ? Sec to ibrutinib- discussed re: bone marrow bx; hold off for now.   # Weight loss of approximately 20 pounds- stable for last 3 months; monitor for now.   # Hypertension-secondary to ibrutinib; 977 systolic at home; keep log at home; recommend compliance  # DISPOSITION: follow up in 2 months/MD- labs- cbc/cmp/ldh- Dr.B  # I reviewed the blood work- with the patient in detail; also reviewed the imaging independently [as summarized above]; and with the patient in detail.   CC: Dr.Tate.

## 2017-10-29 NOTE — Progress Notes (Signed)
[facility] OFFICE PROGRESS NOTE  Patient Care Team: Albina Billet, MD as PCP - General (Internal Medicine)  Cancer Staging No matching staging information was found for the patient.   Oncology History   1. Ultrasound revealed abdominal lymphadenopathy(March, 2013) 2. Biopsy as well as peripheral blood be suggestive of chronic lymphocytic leukemia (April, 2013) 3. Started on bendamustin and Rituxan from May of 2013 4. Has finished Heather [name] 6 cycles of chemotherapy with Rituxan and bendamustine (October 26, 2011) 5.recurrent and progressive disease with progressive anemia.  Patient was started on IBRUTANIB (March, 2016)  taking 140 mg 3 pills a day  6. Patient was taken off IBRUTANIB in August of 2016 because of persistent myelosuppression even with reducing dose 7. Because of rising WBC count and anemia patient would be started back on a lower dose of improved on ibrutanib 140 mg 2 tablets a day -----------------------------------------------------   DIAGNOSIS: [ ]  CLL  STAGE: 4      ;GOALS: Control  CURRENT/MOST RECENT THERAPY [2016] ibrutinib      Chronic lymphocytic leukemia ([place])   06/06/2014 Initial Diagnosis    Chronic lymphocytic leukemia     INTERVAL HISTORY:  Heather [name] 84 y.o.  female pleasant patient above history of CLL on ibrutinib is here for follow-up/reviewed the list of the PET scan.  Patient continues to complain of fatigue.  Otherwise no fevers or chills.  No nausea no vomiting no headaches.  Chronic swelling in the legs.  Patient states that she has not taken Heather [name] blood pressure medication this morning.  Review of Systems  Constitutional: Positive for malaise/fatigue and weight loss. Negative for chills, diaphoresis and fever.  HENT: Negative for nosebleeds and sore throat.   Eyes: Negative for double vision.  Respiratory: Positive for shortness of breath (Shortness of breath only with exertion.). Negative for cough, hemoptysis,  sputum production and wheezing.   Cardiovascular: Negative for chest pain, palpitations, orthopnea and leg swelling.  Gastrointestinal: Negative for abdominal pain, blood in stool, constipation, diarrhea, heartburn, melena, nausea and vomiting.  Genitourinary: Negative for dysuria, frequency and urgency.  Musculoskeletal: Negative for back pain and joint pain.  Skin: Negative.  Negative for itching and rash.  Neurological: Negative for dizziness, tingling, focal weakness, weakness and headaches.  Endo/Heme/Allergies: Does not bruise/bleed easily.  Psychiatric/Behavioral: Negative for depression. The patient is not nervous/anxious and does not have insomnia.       PAST MEDICAL HISTORY :  Past Medical History:  Diagnosis Date  . Anemia   . Cataracts, bilateral   . Chronic lymphocytic leukemia ([place]) 06/06/2014  . Hearing loss   . History of chemotherapy   . Hyperlipidemia   . Hypertension   . Hypothyroidism   . Palpitations   . Risk for falls   . Thrombocytopenia ([employer])     PAST SURGICAL HISTORY :   Past Surgical History:  Procedure Laterality Date  . CATARACT EXTRACTION Left 03/29/2012  . Excision left cervical node biopsy  05/04/2011    FAMILY HISTORY :   Family History  Problem Relation Age of Onset  . Heart disease Mother   . Stomach cancer Father   . Diabetes Sister     SOCIAL HISTORY:   Social History   Tobacco Use  . Smoking status: Never Smoker  . Smokeless tobacco: Never Used  Substance Use Topics  . Alcohol use: No    Alcohol/week: 0.0 standard drinks  . Drug use: No    ALLERGIES:  has No Known Allergies.  MEDICATIONS:  Current Outpatient Medications  Medication Sig Dispense Refill  . hydrALAZINE (APRESOLINE) 25 MG tablet TAKE 1 TABLET (25 MG TOTAL) BY MOUTH 2 (TWO) TIMES DAILY. 180 tablet 3  . IMBRUVICA 280 MG TABS TAKE 1 TABLET BY MOUTH DAILY. 28 tablet 6  . levothyroxine (SYNTHROID, LEVOTHROID) 75 MCG tablet Take 75 mcg by mouth daily before  breakfast. Only takes Saturday- Thursday. Hold synthroid on Friday    . losartan (COZAAR) 100 MG tablet Take 100 mg by mouth daily.     No current facility-administered medications for this visit.     PHYSICAL EXAMINATION: ECOG PERFORMANCE STATUS: 2 - Symptomatic, <50% confined to bed  BP (!) 188/62 (BP Location: Left Arm, Patient Position: Sitting)   Pulse 93   Temp 99.2 F (37.3 C) (Oral)   Resp 20   Ht 5\' 2"  (1.575 m)   Wt 144 lb (65.3 kg)   BMI 26.34 kg/m   Filed Weights   10/29/17 1029  Weight: 144 lb (65.3 kg)    Physical Exam  Constitutional: She is oriented to person, place, and time and well-developed, well-nourished, and in no distress.  Elderly frail female patient she is walking with a walker.  Accompanied by Heather [name] sister.  HENT:  Head: Normocephalic and atraumatic.  Mouth/Throat: Oropharynx is clear and moist. No oropharyngeal exudate.  Eyes: Pupils are equal, round, and reactive to light.  Neck: Normal range of motion. Neck supple.  Cardiovascular: Normal rate and regular rhythm.  Pulmonary/Chest: No respiratory distress. She has no wheezes.  Abdominal: Soft. Bowel sounds are normal. She exhibits no distension and no mass. There is no tenderness. There is no rebound and no guarding.  Musculoskeletal: Normal range of motion. She exhibits no edema or tenderness.  Mild bilateral leg swelling.  Neurological: She is alert and oriented to person, place, and time.  Skin: Skin is warm.  Psychiatric: Affect normal.      LABORATORY DATA:  I have reviewed the data as listed    Component Value Date/Time   NA 139 10/29/2017 0953   NA 133 (L) 05/08/2014 1349   K 3.7 10/29/2017 0953   K 3.6 05/08/2014 1349   CL 106 10/29/2017 0953   CL 102 05/08/2014 1349   CO2 26 10/29/2017 0953   CO2 26 05/08/2014 1349   GLUCOSE 92 10/29/2017 0953   GLUCOSE 82 05/08/2014 1349   BUN 12 10/29/2017 0953   BUN 18 05/08/2014 1349   CREATININE 0.93 10/29/2017 0953   CREATININE  1.13 (H) 05/08/2014 1349   CALCIUM 9.5 10/29/2017 0953   CALCIUM 9.1 05/08/2014 1349   PROT 6.5 10/29/2017 0953   PROT 6.7 05/08/2014 1349   ALBUMIN 4.3 10/29/2017 0953   ALBUMIN 4.2 05/08/2014 1349   AST 34 10/29/2017 0953   AST 24 05/08/2014 1349   ALT 17 10/29/2017 0953   ALT 13 (L) 05/08/2014 1349   ALKPHOS 30 (L) 10/29/2017 0953   ALKPHOS 41 05/08/2014 1349   BILITOT 1.3 (H) 10/29/2017 0953   BILITOT 1.3 (H) 05/08/2014 1349   GFRNONAA 54 (L) 10/29/2017 0953   GFRNONAA 45 (L) 05/08/2014 1349   GFRAA >60 10/29/2017 0953   GFRAA 52 (L) 05/08/2014 1349    No results found for: SPEP, UPEP  Lab Results  Component Value Date   WBC 2.6 (L) 10/29/2017   NEUTROABS 0.7 (L) 10/29/2017   HGB 14.0 10/29/2017   HCT 42.9 10/29/2017   MCV 88.5 10/29/2017   PLT 90 (L) 10/29/2017  Chemistry      Component Value Date/Time   NA 139 10/29/2017 0953   NA 133 (L) 05/08/2014 1349   K 3.7 10/29/2017 0953   K 3.6 05/08/2014 1349   CL 106 10/29/2017 0953   CL 102 05/08/2014 1349   CO2 26 10/29/2017 0953   CO2 26 05/08/2014 1349   BUN 12 10/29/2017 0953   BUN 18 05/08/2014 1349   CREATININE 0.93 10/29/2017 0953   CREATININE 1.13 (H) 05/08/2014 1349      Component Value Date/Time   CALCIUM 9.5 10/29/2017 0953   CALCIUM 9.1 05/08/2014 1349   ALKPHOS 30 (L) 10/29/2017 0953   ALKPHOS 41 05/08/2014 1349   AST 34 10/29/2017 0953   AST 24 05/08/2014 1349   ALT 17 10/29/2017 0953   ALT 13 (L) 05/08/2014 1349   BILITOT 1.3 (H) 10/29/2017 0953   BILITOT 1.3 (H) 05/08/2014 1349       RADIOGRAPHIC STUDIES: I have personally reviewed the radiological images as listed and agreed with the findings in the report. No results found.   ASSESSMENT & PLAN:  Chronic lymphocytic leukemia ([place]) # CLL-on ibrutinib.  PET scan OCT 2019- moderate splenomegaly; slightly increased in size [compared to CT scan July 2019].  Overall clinically stable.  Continue ibrutinib.  #  Neutropenia/thrombocytopenia [ ANC-700/ platelets- 90s];but Hb 14- ? Sec to ibrutinib- discussed re: bone marrow bx; hold off for now.   # Weight loss of approximately 20 pounds- stable for last 3 months; monitor for now.   # Hypertension-secondary to ibrutinib; 741 systolic at home; keep log at home; recommend compliance  # DISPOSITION: follow up in 2 months/MD- labs- cbc/cmp/ldh- Dr.B  # I reviewed the blood work- with the patient in detail; also reviewed the imaging independently [as summarized above]; and with the patient in detail.   CC: Dr.Tate.    No orders of the defined types were placed in this encounter.  All questions were answered. The patient knows to call the clinic with any problems, questions or concerns.      Cammie Sickle, MD 10/29/2017 9:55 PM

## 2017-11-15 MED FILL — IMBRUVICA 280 MG TAB: 280 | 28 days supply | Qty: 28 | Fill #4

## 2017-12-13 MED FILL — IMBRUVICA 280 MG TAB: 280 | 28 days supply | Qty: 28 | Fill #5

## 2017-12-27 ENCOUNTER — Other Ambulatory Visit: Payer: Self-pay

## 2017-12-27 DIAGNOSIS — C9110 Chronic lymphocytic leukemia of B-cell type not having achieved remission: Secondary | ICD-10-CM

## 2017-12-31 ENCOUNTER — Inpatient Hospital Stay: Payer: Medicare Other

## 2017-12-31 ENCOUNTER — Inpatient Hospital Stay: Payer: Medicare Other | Admitting: Internal Medicine

## 2018-01-07 ENCOUNTER — Encounter: Payer: Self-pay | Admitting: Internal Medicine

## 2018-01-07 ENCOUNTER — Inpatient Hospital Stay: Payer: Medicare Other | Attending: Internal Medicine

## 2018-01-07 ENCOUNTER — Inpatient Hospital Stay (HOSPITAL_BASED_OUTPATIENT_CLINIC_OR_DEPARTMENT_OTHER): Payer: Medicare Other | Admitting: Internal Medicine

## 2018-01-07 VITALS — BP 161/67 | HR 88 | Resp 16 | Wt 145.4 lb

## 2018-01-07 DIAGNOSIS — D649 Anemia, unspecified: Secondary | ICD-10-CM

## 2018-01-07 DIAGNOSIS — I10 Essential (primary) hypertension: Secondary | ICD-10-CM

## 2018-01-07 DIAGNOSIS — C9110 Chronic lymphocytic leukemia of B-cell type not having achieved remission: Secondary | ICD-10-CM | POA: Diagnosis present

## 2018-01-07 DIAGNOSIS — R161 Splenomegaly, not elsewhere classified: Secondary | ICD-10-CM

## 2018-01-07 DIAGNOSIS — Z79899 Other long term (current) drug therapy: Secondary | ICD-10-CM

## 2018-01-07 LAB — CBC WITH DIFFERENTIAL/PLATELET
ABS IMMATURE GRANULOCYTES: 0.07 10*3/uL (ref 0.00–0.07)
BASOS ABS: 0 10*3/uL (ref 0.0–0.1)
BASOS PCT: 0 %
Eosinophils Absolute: 0 10*3/uL (ref 0.0–0.5)
Eosinophils Relative: 0 %
HCT: 39.7 % (ref 36.0–46.0)
HEMOGLOBIN: 13 g/dL (ref 12.0–15.0)
IMMATURE GRANULOCYTES: 3 %
LYMPHS PCT: 54 %
Lymphs Abs: 1.2 10*3/uL (ref 0.7–4.0)
MCH: 28.8 pg (ref 26.0–34.0)
MCHC: 32.7 g/dL (ref 30.0–36.0)
MCV: 88 fL (ref 80.0–100.0)
MONO ABS: 0.3 10*3/uL (ref 0.1–1.0)
Monocytes Relative: 13 %
NEUTROS ABS: 0.7 10*3/uL — AB (ref 1.7–7.7)
NEUTROS PCT: 30 %
NRBC: 0 % (ref 0.0–0.2)
PLATELETS: 96 10*3/uL — AB (ref 150–400)
RBC: 4.51 MIL/uL (ref 3.87–5.11)
RDW: 13.8 % (ref 11.5–15.5)
WBC: 2.3 10*3/uL — AB (ref 4.0–10.5)

## 2018-01-07 LAB — COMPREHENSIVE METABOLIC PANEL
ALK PHOS: 34 U/L — AB (ref 38–126)
ALT: 17 U/L (ref 0–44)
ANION GAP: 9 (ref 5–15)
AST: 30 U/L (ref 15–41)
Albumin: 4 g/dL (ref 3.5–5.0)
BUN: 14 mg/dL (ref 8–23)
CALCIUM: 8.8 mg/dL — AB (ref 8.9–10.3)
CHLORIDE: 106 mmol/L (ref 98–111)
CO2: 24 mmol/L (ref 22–32)
Creatinine, Ser: 0.95 mg/dL (ref 0.44–1.00)
GFR calc non Af Amer: 54 mL/min — ABNORMAL LOW (ref >60)
GLUCOSE: 94 mg/dL (ref 70–99)
Potassium: 3.5 mmol/L (ref 3.5–5.1)
SODIUM: 139 mmol/L (ref 135–145)
Total Bilirubin: 1.5 mg/dL — ABNORMAL HIGH (ref 0.3–1.2)
Total Protein: 6.3 g/dL — ABNORMAL LOW (ref 6.5–8.1)

## 2018-01-07 LAB — LACTATE DEHYDROGENASE
LDH: 290 U/L — AB (ref 98–192)
LDH: 286 U/L — ABNORMAL HIGH (ref 98–192)

## 2018-01-07 NOTE — Progress Notes (Signed)
[place] OFFICE PROGRESS NOTE  Patient Care Team: Albina Billet, MD as PCP - General (Internal Medicine)  Cancer Staging No matching staging information was found for the patient.   Oncology History   1. Ultrasound revealed abdominal lymphadenopathy(March, 2013) 2. Biopsy as well as peripheral blood be suggestive of chronic lymphocytic leukemia (April, 2013) 3. Started on bendamustin and Rituxan from May of 2013 4. Has finished her 6 cycles of chemotherapy with Rituxan and bendamustine (October 26, 2011) 5.recurrent and progressive disease with progressive anemia.  Patient was started on IBRUTANIB (March, 2016)  taking 140 mg 3 pills a day  6. Patient was taken off IBRUTANIB in August of 2016 because of persistent myelosuppression even with reducing dose 7. Because of rising WBC count and anemia patient would be started back on a lower dose of improved on ibrutanib 140 mg 2 tablets a day -----------------------------------------------------   DIAGNOSIS: [ ]  CLL  STAGE: 4 ; GOALS: Control  CURRENT/MOST RECENT THERAPY [2016] ibrutinib      Chronic lymphocytic leukemia ([place])   06/06/2014 Initial Diagnosis    Chronic lymphocytic leukemia     INTERVAL HISTORY:  Heather [name] 84 y.o.  female pleasant patient above history of CLL on ibrutinib is here for follow-up.   Patient continues to feel poorly.  Complains of extreme fatigue.  Poor appetite.  Positive weight loss.   No fever no chills no night sweats.  Complains of shortness of breath on exertion.  Review of Systems  Constitutional: Positive for malaise/fatigue and weight loss. Negative for chills, diaphoresis and fever.  HENT: Negative for nosebleeds and sore throat.   Eyes: Negative for double vision.  Respiratory: Positive for shortness of breath (Shortness of breath only with exertion.). Negative for cough, hemoptysis, sputum production and wheezing.   Cardiovascular: Negative for chest pain,  palpitations, orthopnea and leg swelling.  Gastrointestinal: Negative for abdominal pain, blood in stool, constipation, diarrhea, heartburn, melena, nausea and vomiting.  Genitourinary: Negative for dysuria, frequency and urgency.  Musculoskeletal: Negative for back pain and joint pain.  Skin: Negative.  Negative for itching and rash.  Neurological: Negative for dizziness, tingling, focal weakness, weakness and headaches.  Endo/Heme/Allergies: Does not bruise/bleed easily.  Psychiatric/Behavioral: Negative for depression. The patient is not nervous/anxious and does not have insomnia.       PAST MEDICAL HISTORY :  Past Medical History:  Diagnosis Date  . Anemia   . Cataracts, bilateral   . Chronic lymphocytic leukemia ([place]) 06/06/2014  . Hearing loss   . History of chemotherapy   . Hyperlipidemia   . Hypertension   . Hypothyroidism   . Palpitations   . Risk for falls   . Thrombocytopenia ([place])     PAST SURGICAL HISTORY :   Past Surgical History:  Procedure Laterality Date  . CATARACT EXTRACTION Left 03/29/2012  . Excision left cervical node biopsy  05/04/2011    FAMILY HISTORY :   Family History  Problem Relation Age of Onset  . Heart disease Mother   . Stomach cancer Father   . Diabetes Sister     SOCIAL HISTORY:   Social History   Tobacco Use  . Smoking status: Never Smoker  . Smokeless tobacco: Never Used  Substance Use Topics  . Alcohol use: No    Alcohol/week: 0.0 standard drinks  . Drug use: No    ALLERGIES:  has No Known Allergies.  MEDICATIONS:  Current Outpatient Medications  Medication Sig Dispense Refill  . hydrALAZINE (  APRESOLINE) 25 MG tablet TAKE 1 TABLET (25 MG TOTAL) BY MOUTH 2 (TWO) TIMES DAILY. 180 tablet 3  . IMBRUVICA 280 MG TABS TAKE 1 TABLET BY MOUTH DAILY. 28 tablet 6  . levothyroxine (SYNTHROID, LEVOTHROID) 75 MCG tablet Take 75 mcg by mouth daily before breakfast. Only takes Saturday- Thursday. Hold synthroid on Friday    . losartan  (COZAAR) 100 MG tablet Take 100 mg by mouth daily.     No current facility-administered medications for this visit.     PHYSICAL EXAMINATION: ECOG PERFORMANCE STATUS: 2 - Symptomatic, <50% confined to bed  BP (!) 161/67 (BP Location: Left Arm, Patient Position: Sitting, Cuff Size: Normal)   Pulse 88   Resp 16   Wt 145 lb 6.4 oz (66 kg)   BMI 26.59 kg/m   Filed Weights   01/07/18 1118  Weight: 145 lb 6.4 oz (66 kg)    Physical Exam  Constitutional: She is oriented to person, place, and time and well-developed, well-nourished, and in no distress.  Elderly frail female patient she is walking with a walker.  Accompanied by her sister.  HENT:  Head: Normocephalic and atraumatic.  Mouth/Throat: Oropharynx is clear and moist. No oropharyngeal exudate.  Eyes: Pupils are equal, round, and reactive to light.  Neck: Normal range of motion. Neck supple.  Cardiovascular: Normal rate and regular rhythm.  Pulmonary/Chest: No respiratory distress. She has no wheezes.  Abdominal: Soft. Bowel sounds are normal. She exhibits no distension and no mass. There is no abdominal tenderness. There is no rebound and no guarding.  Musculoskeletal: Normal range of motion.        General: No tenderness or edema.     Comments: Mild bilateral leg swelling.  Neurological: She is alert and oriented to person, place, and time.  Skin: Skin is warm.  Psychiatric: Affect normal.      LABORATORY DATA:  I have reviewed the data as listed    Component Value Date/Time   NA 139 01/07/2018 1022   NA 133 (L) 05/08/2014 1349   K 3.5 01/07/2018 1022   K 3.6 05/08/2014 1349   CL 106 01/07/2018 1022   CL 102 05/08/2014 1349   CO2 24 01/07/2018 1022   CO2 26 05/08/2014 1349   GLUCOSE 94 01/07/2018 1022   GLUCOSE 82 05/08/2014 1349   BUN 14 01/07/2018 1022   BUN 18 05/08/2014 1349   CREATININE 0.95 01/07/2018 1022   CREATININE 1.13 (H) 05/08/2014 1349   CALCIUM 8.8 (L) 01/07/2018 1022   CALCIUM 9.1  05/08/2014 1349   PROT 6.3 (L) 01/07/2018 1022   PROT 6.7 05/08/2014 1349   ALBUMIN 4.0 01/07/2018 1022   ALBUMIN 4.2 05/08/2014 1349   AST 30 01/07/2018 1022   AST 24 05/08/2014 1349   ALT 17 01/07/2018 1022   ALT 13 (L) 05/08/2014 1349   ALKPHOS 34 (L) 01/07/2018 1022   ALKPHOS 41 05/08/2014 1349   BILITOT 1.5 (H) 01/07/2018 1022   BILITOT 1.3 (H) 05/08/2014 1349   GFRNONAA 54 (L) 01/07/2018 1022   GFRNONAA 45 (L) 05/08/2014 1349   GFRAA >60 01/07/2018 1022   GFRAA 52 (L) 05/08/2014 1349    No results found for: SPEP, UPEP  Lab Results  Component Value Date   WBC 2.3 (L) 01/07/2018   NEUTROABS 0.7 (L) 01/07/2018   HGB 13.0 01/07/2018   HCT 39.7 01/07/2018   MCV 88.0 01/07/2018   PLT 96 (L) 01/07/2018      Chemistry  Component Value Date/Time   NA 139 01/07/2018 1022   NA 133 (L) 05/08/2014 1349   K 3.5 01/07/2018 1022   K 3.6 05/08/2014 1349   CL 106 01/07/2018 1022   CL 102 05/08/2014 1349   CO2 24 01/07/2018 1022   CO2 26 05/08/2014 1349   BUN 14 01/07/2018 1022   BUN 18 05/08/2014 1349   CREATININE 0.95 01/07/2018 1022   CREATININE 1.13 (H) 05/08/2014 1349      Component Value Date/Time   CALCIUM 8.8 (L) 01/07/2018 1022   CALCIUM 9.1 05/08/2014 1349   ALKPHOS 34 (L) 01/07/2018 1022   ALKPHOS 41 05/08/2014 1349   AST 30 01/07/2018 1022   AST 24 05/08/2014 1349   ALT 17 01/07/2018 1022   ALT 13 (L) 05/08/2014 1349   BILITOT 1.5 (H) 01/07/2018 1022   BILITOT 1.3 (H) 05/08/2014 1349       RADIOGRAPHIC STUDIES: I have personally reviewed the radiological images as listed and agreed with the findings in the report. No results found.   ASSESSMENT & PLAN:  Chronic lymphocytic leukemia ([place]) # CLL-on ibrutinib.  PET scan OCT 2019- moderate splenomegaly; slightly increased in size [compared to CT scan July 2019]; otherwise obvious disease anywhere else.  Currently on ibrutinib  # Clinically worsened-unclear etiology; discontinue ibrutinib.   Concerns for clinical progression-discussed further evaluation with an MRI of the abdomen/spleen.  Question transformation within the spleen-we will plan to get the next 2 weeks.  # Neutropenia/thrombocytopenia [ ANC-700/ platelets- 90s];but Hb 14- ?  Question transformation within the spleen-with rising LDH.   #Weight loss of 20 pounds over the last 3 to 4 months.  Question progression see above weight MRI.  # Hypertension-secondary to ibrutinib; 222L systolic at home; keep log at home. STABLE.   # DISPOSITION: MRI abdomen in 2 weeks # follow up MD-labs-cbc/LDH-Dr.B  CC: Dr.Tate.    Orders Placed This Encounter  Procedures  . MR Abdomen W Wo Contrast    Standing Status:   Future    Standing Expiration Date:   01/07/2019    Order Specific Question:   ** REASON FOR EXAM (FREE TEXT)    Answer:   splenic lesion    Order Specific Question:   If indicated for the ordered procedure, I authorize the administration of contrast media per Radiology protocol    Answer:   Yes    Order Specific Question:   What is the patient's sedation requirement?    Answer:   No Sedation    Order Specific Question:   Does the patient have a pacemaker or implanted devices?    Answer:   No    Order Specific Question:   Radiology Contrast Protocol - do NOT remove file path    Answer:   [email or web address]    Order Specific Question:   Preferred imaging location?    Answer:   [facility] (table limit-300lbs)  . CBC with Differential/Platelet    Standing Status:   Future    Standing Expiration Date:   01/08/2019  . Lactate dehydrogenase    Standing Status:   Future    Standing Expiration Date:   01/08/2019   All questions were answered. The patient knows to call the clinic with any problems, questions or concerns.      Cammie Sickle, MD 01/09/2018 5:50 PM

## 2018-01-07 NOTE — Assessment & Plan Note (Addendum)
#   CLL-on ibrutinib.  PET scan OCT 2019- moderate splenomegaly; slightly increased in size [compared to CT scan July 2019]; otherwise obvious disease anywhere else.  Currently on ibrutinib  # Clinically worsened-unclear etiology; discontinue ibrutinib.  Concerns for clinical progression-discussed further evaluation with an MRI of the abdomen/spleen.  Question transformation within the spleen-we will plan to get the next 2 weeks.  # Neutropenia/thrombocytopenia [ ANC-700/ platelets- 90s];but Hb 14- ?  Question transformation within the spleen-with rising LDH.   #Weight loss of 20 pounds over the last 3 to 4 months.  Question progression see above weight MRI.  # Hypertension-secondary to ibrutinib; 412K systolic at home; keep log at home. STABLE.   # DISPOSITION: MRI abdomen in 2 weeks # follow up MD-labs-cbc/LDH-Dr.B  CC: Dr.Tate.

## 2018-01-13 MED FILL — IMBRUVICA 280 MG TAB: 280 | 28 days supply | Qty: 28 | Fill #6

## 2018-01-28 ENCOUNTER — Ambulatory Visit
Admission: RE | Admit: 2018-01-28 | Discharge: 2018-01-28 | Disposition: A | Discharge status: Home / Self Care | Payer: Medicare Other | Source: Ambulatory Visit | Attending: Internal Medicine | Admitting: Internal Medicine

## 2018-01-28 ENCOUNTER — Other Ambulatory Visit: Payer: Self-pay | Admitting: Internal Medicine

## 2018-01-28 ENCOUNTER — Ambulatory Visit: Payer: Medicare Other

## 2018-01-28 DIAGNOSIS — R161 Splenomegaly, not elsewhere classified: Secondary | ICD-10-CM | POA: Insufficient documentation

## 2018-01-28 DIAGNOSIS — C9110 Chronic lymphocytic leukemia of B-cell type not having achieved remission: Secondary | ICD-10-CM | POA: Diagnosis present

## 2018-01-28 MED ORDER — GADOBUTROL 1 MMOL/ML IV SOLN
6.0000 mL | Freq: Once | INTRAVENOUS | Status: AC | PRN
  Administered 2018-01-28: 6 mL via INTRAVENOUS

## 2018-02-02 ENCOUNTER — Inpatient Hospital Stay: Payer: Medicare Other | Attending: Internal Medicine

## 2018-02-02 ENCOUNTER — Inpatient Hospital Stay (HOSPITAL_BASED_OUTPATIENT_CLINIC_OR_DEPARTMENT_OTHER): Payer: Medicare Other | Admitting: Internal Medicine

## 2018-02-02 ENCOUNTER — Encounter: Payer: Self-pay | Admitting: Internal Medicine

## 2018-02-02 DIAGNOSIS — D72819 Decreased white blood cell count, unspecified: Secondary | ICD-10-CM

## 2018-02-02 DIAGNOSIS — D696 Thrombocytopenia, unspecified: Secondary | ICD-10-CM | POA: Diagnosis not present

## 2018-02-02 DIAGNOSIS — I10 Essential (primary) hypertension: Secondary | ICD-10-CM | POA: Diagnosis not present

## 2018-02-02 DIAGNOSIS — R634 Abnormal weight loss: Secondary | ICD-10-CM | POA: Insufficient documentation

## 2018-02-02 DIAGNOSIS — R5383 Other fatigue: Secondary | ICD-10-CM | POA: Insufficient documentation

## 2018-02-02 DIAGNOSIS — R0609 Other forms of dyspnea: Secondary | ICD-10-CM

## 2018-02-02 DIAGNOSIS — C9110 Chronic lymphocytic leukemia of B-cell type not having achieved remission: Secondary | ICD-10-CM | POA: Insufficient documentation

## 2018-02-02 DIAGNOSIS — R630 Anorexia: Secondary | ICD-10-CM | POA: Diagnosis not present

## 2018-02-02 LAB — CBC WITH DIFFERENTIAL/PLATELET
Abs Immature Granulocytes: 0.13 10*3/uL — ABNORMAL HIGH (ref 0.00–0.07)
BASOS ABS: 0 10*3/uL (ref 0.0–0.1)
Basophils Relative: 1 %
EOS ABS: 0 10*3/uL (ref 0.0–0.5)
EOS PCT: 0 %
HCT: 40.2 % (ref 36.0–46.0)
Hemoglobin: 12.9 g/dL (ref 12.0–15.0)
Immature Granulocytes: 5 %
Lymphocytes Relative: 55 %
Lymphs Abs: 1.4 10*3/uL (ref 0.7–4.0)
MCH: 28 pg (ref 26.0–34.0)
MCHC: 32.1 g/dL (ref 30.0–36.0)
MCV: 87.2 fL (ref 80.0–100.0)
Monocytes Absolute: 0.3 10*3/uL (ref 0.1–1.0)
Monocytes Relative: 11 %
NRBC: 0 % (ref 0.0–0.2)
Neutro Abs: 0.7 10*3/uL — ABNORMAL LOW (ref 1.7–7.7)
Neutrophils Relative %: 28 %
PLATELETS: 115 10*3/uL — AB (ref 150–400)
RBC: 4.61 MIL/uL (ref 3.87–5.11)
RDW: 13.4 % (ref 11.5–15.5)
WBC: 2.6 10*3/uL — AB (ref 4.0–10.5)

## 2018-02-02 LAB — LACTATE DEHYDROGENASE: LDH: 315 U/L — ABNORMAL HIGH (ref 98–192)

## 2018-02-02 NOTE — Progress Notes (Signed)
[name] OFFICE PROGRESS NOTE  Patient Care Team: Albina Billet, MD as PCP - General (Internal Medicine)  Cancer Staging No matching staging information was found for the patient.   Oncology History   1. Ultrasound revealed abdominal lymphadenopathy(March, 2013) 2. Biopsy as well as peripheral blood be suggestive of chronic lymphocytic leukemia (April, 2013) 3. Started on bendamustin and Rituxan from May of 2013 4. Has finished her 6 cycles of chemotherapy with Rituxan and bendamustine (October 26, 2011) 5.recurrent and progressive disease with progressive anemia.  Patient was started on IBRUTANIB (March, 2016)  taking 140 mg 3 pills a day  6. Patient was taken off IBRUTANIB in August of 2016 because of persistent myelosuppression even with reducing dose 7. Because of rising WBC count and anemia patient would be started back on a lower dose of improved on ibrutanib 140 mg 2 tablets a day -----------------------------------------------------   DIAGNOSIS: [ ]  CLL  STAGE: 4 ; GOALS: Control  CURRENT/MOST RECENT THERAPY [2016] ibrutinib      Chronic lymphocytic leukemia ([place])   06/06/2014 Initial Diagnosis    Chronic lymphocytic leukemia     INTERVAL HISTORY:  Heather [name] 85 y.o.  female pleasant patient above history of CLL on ibrutinib is here for follow-up/review the results of the MRI abdomen.  Overall she continues to feel poorly.  Complains of ongoing fatigue.  Poor appetite.  Possible weight loss.   No fever no chills.  No night sweats.  Complains of shortness of breath on exertion.  Review of Systems  Constitutional: Positive for malaise/fatigue and weight loss. Negative for chills, diaphoresis and fever.  HENT: Negative for nosebleeds and sore throat.   Eyes: Negative for double vision.  Respiratory: Positive for shortness of breath (Shortness of breath only with exertion.). Negative for cough, hemoptysis, sputum production and wheezing.    Cardiovascular: Negative for chest pain, palpitations, orthopnea and leg swelling.  Gastrointestinal: Negative for abdominal pain, blood in stool, constipation, diarrhea, heartburn, melena, nausea and vomiting.  Genitourinary: Negative for dysuria, frequency and urgency.  Musculoskeletal: Negative for back pain and joint pain.  Skin: Negative.  Negative for itching and rash.  Neurological: Negative for dizziness, tingling, focal weakness, weakness and headaches.  Endo/Heme/Allergies: Does not bruise/bleed easily.  Psychiatric/Behavioral: Negative for depression. The patient is not nervous/anxious and does not have insomnia.       PAST MEDICAL HISTORY :  Past Medical History:  Diagnosis Date  . Anemia   . Cataracts, bilateral   . Chronic lymphocytic leukemia ([place]) 06/06/2014  . Hearing loss   . History of chemotherapy   . Hyperlipidemia   . Hypertension   . Hypothyroidism   . Palpitations   . Risk for falls   . Thrombocytopenia ([place])     PAST SURGICAL HISTORY :   Past Surgical History:  Procedure Laterality Date  . CATARACT EXTRACTION Left 03/29/2012  . Excision left cervical node biopsy  05/04/2011    FAMILY HISTORY :   Family History  Problem Relation Age of Onset  . Heart disease Mother   . Stomach cancer Father   . Diabetes Sister     SOCIAL HISTORY:   Social History   Tobacco Use  . Smoking status: Never Smoker  . Smokeless tobacco: Never Used  Substance Use Topics  . Alcohol use: No    Alcohol/week: 0.0 standard drinks  . Drug use: No    ALLERGIES:  has No Known Allergies.  MEDICATIONS:  Current Outpatient Medications  Medication Sig Dispense Refill  . hydrALAZINE (APRESOLINE) 25 MG tablet TAKE 1 TABLET (25 MG TOTAL) BY MOUTH 2 (TWO) TIMES DAILY. 180 tablet 3  . IMBRUVICA 280 MG TABS TAKE 1 TABLET BY MOUTH DAILY. 28 tablet 6  . levothyroxine (SYNTHROID, LEVOTHROID) 75 MCG tablet Take 75 mcg by mouth daily before breakfast. Only takes Saturday-  Thursday. Hold synthroid on Friday    . losartan (COZAAR) 100 MG tablet Take 100 mg by mouth daily.     No current facility-administered medications for this visit.     PHYSICAL EXAMINATION: ECOG PERFORMANCE STATUS: 2 - Symptomatic, <50% confined to bed  BP (!) 147/74 (BP Location: Left Arm, Patient Position: Sitting, Cuff Size: Normal)   Temp 98.4 F (36.9 C) (Tympanic)   Resp 16   Wt 135 lb 3.2 oz (61.3 kg)   BMI 24.73 kg/m   Filed Weights   02/02/18 1130  Weight: 135 lb 3.2 oz (61.3 kg)    Physical Exam  Constitutional: She is oriented to person, place, and time and well-developed, well-nourished, and in no distress.  Elderly frail female patient she is walking with a walker.  Accompanied by her sister.  HENT:  Head: Normocephalic and atraumatic.  Mouth/Throat: Oropharynx is clear and moist. No oropharyngeal exudate.  Eyes: Pupils are equal, round, and reactive to light.  Neck: Normal range of motion. Neck supple.  Cardiovascular: Normal rate and regular rhythm.  Pulmonary/Chest: No respiratory distress. She has no wheezes.  Abdominal: Soft. Bowel sounds are normal. She exhibits no distension and no mass. There is no abdominal tenderness. There is no rebound and no guarding.  Musculoskeletal: Normal range of motion.        General: No tenderness or edema.     Comments: Mild bilateral leg swelling.  Neurological: She is alert and oriented to person, place, and time.  Skin: Skin is warm.  Psychiatric: Affect normal.      LABORATORY DATA:  I have reviewed the data as listed    Component Value Date/Time   NA 139 01/07/2018 1022   NA 133 (L) 05/08/2014 1349   K 3.5 01/07/2018 1022   K 3.6 05/08/2014 1349   CL 106 01/07/2018 1022   CL 102 05/08/2014 1349   CO2 24 01/07/2018 1022   CO2 26 05/08/2014 1349   GLUCOSE 94 01/07/2018 1022   GLUCOSE 82 05/08/2014 1349   BUN 14 01/07/2018 1022   BUN 18 05/08/2014 1349   CREATININE 0.95 01/07/2018 1022   CREATININE 1.13  (H) 05/08/2014 1349   CALCIUM 8.8 (L) 01/07/2018 1022   CALCIUM 9.1 05/08/2014 1349   PROT 6.3 (L) 01/07/2018 1022   PROT 6.7 05/08/2014 1349   ALBUMIN 4.0 01/07/2018 1022   ALBUMIN 4.2 05/08/2014 1349   AST 30 01/07/2018 1022   AST 24 05/08/2014 1349   ALT 17 01/07/2018 1022   ALT 13 (L) 05/08/2014 1349   ALKPHOS 34 (L) 01/07/2018 1022   ALKPHOS 41 05/08/2014 1349   BILITOT 1.5 (H) 01/07/2018 1022   BILITOT 1.3 (H) 05/08/2014 1349   GFRNONAA 54 (L) 01/07/2018 1022   GFRNONAA 45 (L) 05/08/2014 1349   GFRAA >60 01/07/2018 1022   GFRAA 52 (L) 05/08/2014 1349    No results found for: SPEP, UPEP  Lab Results  Component Value Date   WBC 2.6 (L) 02/02/2018   NEUTROABS 0.7 (L) 02/02/2018   HGB 12.9 02/02/2018   HCT 40.2 02/02/2018   MCV 87.2 02/02/2018   PLT 115 (L)  02/02/2018      Chemistry      Component Value Date/Time   NA 139 01/07/2018 1022   NA 133 (L) 05/08/2014 1349   K 3.5 01/07/2018 1022   K 3.6 05/08/2014 1349   CL 106 01/07/2018 1022   CL 102 05/08/2014 1349   CO2 24 01/07/2018 1022   CO2 26 05/08/2014 1349   BUN 14 01/07/2018 1022   BUN 18 05/08/2014 1349   CREATININE 0.95 01/07/2018 1022   CREATININE 1.13 (H) 05/08/2014 1349      Component Value Date/Time   CALCIUM 8.8 (L) 01/07/2018 1022   CALCIUM 9.1 05/08/2014 1349   ALKPHOS 34 (L) 01/07/2018 1022   ALKPHOS 41 05/08/2014 1349   AST 30 01/07/2018 1022   AST 24 05/08/2014 1349   ALT 17 01/07/2018 1022   ALT 13 (L) 05/08/2014 1349   BILITOT 1.5 (H) 01/07/2018 1022   BILITOT 1.3 (H) 05/08/2014 1349       RADIOGRAPHIC STUDIES: I have personally reviewed the radiological images as listed and agreed with the findings in the report. No results found.   ASSESSMENT & PLAN:  Chronic lymphocytic leukemia ([place]) # CLL-on ibrutinib.  PET scan OCT 2019- moderate splenomegaly; slightly increased in size [compared to CT scan July 2019]; January 2020 MRI-approximately 11 cm splenic lesion/chronic but  slowly growing; also about inch in size splenic lesion-new.   #As the patient feeling poorly/weight loss fatigue-question progression of disease/transformation high-grade lymphoma.  Typically would recommend a repeat biopsy including bone marrow/splenic lesion-as per change the prognosis/treatment plan.  #After lengthy discussion patient is not very sure of what she wants to do next.  She is not interested in any aggressive work-up but the same time wants to continue treatment options available.  Also discussed that we will review her case at the tumor conference.  #Mild leukopenia/mild thrombocytopenia-normal hemoglobin-question ibrutinib versus others.  Also discussed regarding bone marrow/see above.  #Weight loss of 20 pounds over the last 3 to 4 months.  See above.  # Hypertension-secondary to ibrutinib; stable.   # DISPOSITION: Will call after discussed at tumor conference. # follow up MD-labs-cbc/LDH-Dr.B  CC: Dr.Tate.    No orders of the defined types were placed in this encounter.  All questions were answered. The patient knows to call the clinic with any problems, questions or concerns.      Cammie Sickle, MD 02/02/2018 1:20 PM

## 2018-02-02 NOTE — Assessment & Plan Note (Addendum)
#   CLL-on ibrutinib.  PET scan OCT 2019- moderate splenomegaly; slightly increased in size [compared to CT scan July 2019]; January 2020 MRI-approximately 11 cm splenic lesion/chronic but slowly growing; also about inch in size splenic lesion-new.   #As the patient feeling poorly/weight loss fatigue-question progression of disease/transformation high-grade lymphoma.  Typically would recommend a repeat biopsy including bone marrow/splenic lesion-as per change the prognosis/treatment plan.  #After lengthy discussion patient is not very sure of what she wants to do next.  She is not interested in any aggressive work-up but the same time wants to continue treatment options available.  Also discussed that we will review her case at the tumor conference.  #Mild leukopenia/mild thrombocytopenia-normal hemoglobin-question ibrutinib versus others.  Also discussed regarding bone marrow/see above.  #Weight loss of 20 pounds over the last 3 to 4 months.  See above.  # Hypertension-secondary to ibrutinib; stable.   # DISPOSITION: Will call after discussed at tumor conference. # follow up MD-labs-cbc/LDH-Dr.B  CC: Dr.Tate.

## 2018-02-03 ENCOUNTER — Other Ambulatory Visit: Payer: Medicare Other

## 2018-02-03 NOTE — Progress Notes (Signed)
Tumor Board Documentation  Heather [name] was presented by Dr Rogue Bussing at our Tumor Board on 02/03/2018, which included representatives from medical oncology, radiation oncology, surgical oncology, internal medicine, navigation, pathology, radiology, surgical, nutrition, research, pulmonology.  Heather [name] currently presents as a current patient with history of the following treatments: active survellience.  Additionally, we reviewed previous medical and familial history, history of present illness, and recent lab results along with all available histopathologic and imaging studies. The tumor board considered available treatment options and made the following recommendations: Active surveillance    The following procedures/referrals were also placed: No orders of the defined types were placed in this encounter.   Clinical Trial Status: not discussed   Staging used: AJCC Stage Group Stage 4  National site-specific guidelines NCCN were discussed with respect to the case.  Tumor board is a meeting of clinicians from various specialty areas who evaluate and discuss patients for whom a multidisciplinary approach is being considered. Final determinations in the plan of care are those of the provider(s). The responsibility for follow up of recommendations given during tumor board is that of the provider.   Today's extended care, comprehensive team conference, Heather [name] was not present for the discussion and was not examined.   Multidisciplinary Tumor Board is a multidisciplinary case peer review process.  Decisions discussed in the Multidisciplinary Tumor Board reflect the opinions of the specialists present at the conference without having examined the patient.  Ultimately, treatment and diagnostic decisions rest with the primary provider(s) and the patient.

## 2018-02-07 ENCOUNTER — Telehealth: Payer: Self-pay | Admitting: Internal Medicine

## 2018-02-07 NOTE — Telephone Encounter (Signed)
FYI-Case reviewed at tumor conference.  Also discussed with patient's PCP Dr. Hall Busing  Recommend bone marrow biopsy for further evaluation.  Discussed with the patient she is reluctant/will discuss again at next visit in about 10 days.

## 2018-02-16 ENCOUNTER — Other Ambulatory Visit: Payer: Self-pay

## 2018-02-16 DIAGNOSIS — C9110 Chronic lymphocytic leukemia of B-cell type not having achieved remission: Secondary | ICD-10-CM

## 2018-02-17 ENCOUNTER — Inpatient Hospital Stay: Payer: Medicare Other

## 2018-02-17 ENCOUNTER — Inpatient Hospital Stay (HOSPITAL_BASED_OUTPATIENT_CLINIC_OR_DEPARTMENT_OTHER): Payer: Medicare Other | Admitting: Internal Medicine

## 2018-02-17 ENCOUNTER — Encounter: Payer: Self-pay | Admitting: Internal Medicine

## 2018-02-17 VITALS — BP 148/63 | HR 103 | Temp 97.6°F | Resp 16 | Wt 136.2 lb

## 2018-02-17 DIAGNOSIS — D72819 Decreased white blood cell count, unspecified: Secondary | ICD-10-CM | POA: Diagnosis not present

## 2018-02-17 DIAGNOSIS — C9110 Chronic lymphocytic leukemia of B-cell type not having achieved remission: Secondary | ICD-10-CM | POA: Diagnosis not present

## 2018-02-17 DIAGNOSIS — R630 Anorexia: Secondary | ICD-10-CM

## 2018-02-17 DIAGNOSIS — R269 Unspecified abnormalities of gait and mobility: Secondary | ICD-10-CM

## 2018-02-17 DIAGNOSIS — I10 Essential (primary) hypertension: Secondary | ICD-10-CM | POA: Diagnosis not present

## 2018-02-17 DIAGNOSIS — R5383 Other fatigue: Secondary | ICD-10-CM

## 2018-02-17 DIAGNOSIS — R0609 Other forms of dyspnea: Secondary | ICD-10-CM

## 2018-02-17 DIAGNOSIS — R531 Weakness: Secondary | ICD-10-CM

## 2018-02-17 DIAGNOSIS — R634 Abnormal weight loss: Secondary | ICD-10-CM

## 2018-02-17 DIAGNOSIS — D696 Thrombocytopenia, unspecified: Secondary | ICD-10-CM | POA: Diagnosis not present

## 2018-02-17 LAB — CBC WITH DIFFERENTIAL/PLATELET
Abs Immature Granulocytes: 0.03 10*3/uL (ref 0.00–0.07)
Basophils Absolute: 0 10*3/uL (ref 0.0–0.1)
Basophils Relative: 1 %
EOS ABS: 0 10*3/uL (ref 0.0–0.5)
Eosinophils Relative: 0 %
HCT: 40.7 % (ref 36.0–46.0)
Hemoglobin: 13.1 g/dL (ref 12.0–15.0)
Immature Granulocytes: 1 %
Lymphocytes Relative: 56 %
Lymphs Abs: 1.5 10*3/uL (ref 0.7–4.0)
MCH: 28.2 pg (ref 26.0–34.0)
MCHC: 32.2 g/dL (ref 30.0–36.0)
MCV: 87.5 fL (ref 80.0–100.0)
Monocytes Absolute: 0.3 10*3/uL (ref 0.1–1.0)
Monocytes Relative: 10 %
Neutro Abs: 0.9 10*3/uL — ABNORMAL LOW (ref 1.7–7.7)
Neutrophils Relative %: 32 %
Platelets: 119 10*3/uL — ABNORMAL LOW (ref 150–400)
RBC: 4.65 MIL/uL (ref 3.87–5.11)
RDW: 13.7 % (ref 11.5–15.5)
WBC: 2.7 10*3/uL — ABNORMAL LOW (ref 4.0–10.5)
nRBC: 0 % (ref 0.0–0.2)

## 2018-02-17 LAB — COMPREHENSIVE METABOLIC PANEL
ALK PHOS: 36 U/L — AB (ref 38–126)
ALT: 23 U/L (ref 0–44)
AST: 43 U/L — AB (ref 15–41)
Albumin: 4 g/dL (ref 3.5–5.0)
Anion gap: 8 (ref 5–15)
BILIRUBIN TOTAL: 1.7 mg/dL — AB (ref 0.3–1.2)
BUN: 10 mg/dL (ref 8–23)
CALCIUM: 9 mg/dL (ref 8.9–10.3)
CO2: 26 mmol/L (ref 22–32)
CREATININE: 0.8 mg/dL (ref 0.44–1.00)
Chloride: 98 mmol/L (ref 98–111)
GFR calc Af Amer: >60 mL/min (ref >60)
Glucose, Bld: 97 mg/dL (ref 70–99)
POTASSIUM: 4 mmol/L (ref 3.5–5.1)
Sodium: 132 mmol/L — ABNORMAL LOW (ref 135–145)
TOTAL PROTEIN: 6.9 g/dL (ref 6.5–8.1)

## 2018-02-17 LAB — LACTATE DEHYDROGENASE: LDH: 325 U/L — ABNORMAL HIGH (ref 98–192)

## 2018-02-17 NOTE — Progress Notes (Signed)
[name] OFFICE PROGRESS NOTE  Patient Care Team: Albina Billet, MD as PCP - General (Internal Medicine)  Cancer Staging No matching staging information was found for the patient.   Oncology History   1. Ultrasound revealed abdominal lymphadenopathy(March, 2013) 2. Biopsy as well as peripheral blood be suggestive of chronic lymphocytic leukemia (April, 2013) 3. Started on bendamustin and Rituxan from May of 2013 4. Has finished her 6 cycles of chemotherapy with Rituxan and bendamustine (October 26, 2011) 5.recurrent and progressive disease with progressive anemia.  Patient was started on IBRUTANIB (March, 2016)  taking 140 mg 3 pills a day  6. Patient was taken off IBRUTANIB in August of 2016 because of persistent myelosuppression even with reducing dose 7. Because of rising WBC count and anemia patient would be started back on a lower dose of improved on ibrutanib 140 mg 2 tablets a day -----------------------------------------------------   DIAGNOSIS: '[ ]'$  CLL  STAGE: 4 ; GOALS: Control  CURRENT/MOST RECENT THERAPY [2016] ibrutinib      Chronic lymphocytic leukemia ([place])   06/06/2014 Initial Diagnosis    Chronic lymphocytic leukemia     INTERVAL HISTORY:  Heather [name] 85 y.o.  female pleasant patient above history of CLL on ibrutinib is here for follow-up.  Patient continues to feel poorly.  She complains of worsening fatigue.  Generalized weakness.  Difficulty getting up and moving around.  Poor appetite.  Weight loss.  Shortness of breath on exertion.  Denies any pains.  Review of Systems  Constitutional: Positive for malaise/fatigue and weight loss. Negative for chills, diaphoresis and fever.  HENT: Negative for nosebleeds and sore throat.   Eyes: Negative for double vision.  Respiratory: Positive for shortness of breath (Shortness of breath only with exertion.). Negative for cough, hemoptysis, sputum production and wheezing.   Cardiovascular:  Negative for chest pain, palpitations, orthopnea and leg swelling.  Gastrointestinal: Negative for abdominal pain, blood in stool, constipation, diarrhea, heartburn, melena, nausea and vomiting.  Genitourinary: Negative for dysuria, frequency and urgency.  Musculoskeletal: Negative for back pain and joint pain.  Skin: Negative.  Negative for itching and rash.  Neurological: Negative for dizziness, tingling, focal weakness, weakness and headaches.  Endo/Heme/Allergies: Does not bruise/bleed easily.  Psychiatric/Behavioral: Negative for depression. The patient is not nervous/anxious and does not have insomnia.       PAST MEDICAL HISTORY :  Past Medical History:  Diagnosis Date  . Anemia   . Cataracts, bilateral   . Chronic lymphocytic leukemia ([place]) 06/06/2014  . Hearing loss   . History of chemotherapy   . Hyperlipidemia   . Hypertension   . Hypothyroidism   . Palpitations   . Risk for falls   . Thrombocytopenia ([place])     PAST SURGICAL HISTORY :   Past Surgical History:  Procedure Laterality Date  . CATARACT EXTRACTION Left 03/29/2012  . Excision left cervical node biopsy  05/04/2011    FAMILY HISTORY :   Family History  Problem Relation Age of Onset  . Heart disease Mother   . Stomach cancer Father   . Diabetes Sister     SOCIAL HISTORY:   Social History   Tobacco Use  . Smoking status: Never Smoker  . Smokeless tobacco: Never Used  Substance Use Topics  . Alcohol use: No    Alcohol/week: 0.0 standard drinks  . Drug use: No    ALLERGIES:  has No Known Allergies.  MEDICATIONS:  Current Outpatient Medications  Medication Sig Dispense Refill  .  hydrALAZINE (APRESOLINE) 25 MG tablet TAKE 1 TABLET (25 MG TOTAL) BY MOUTH 2 (TWO) TIMES DAILY. 180 tablet 3  . IMBRUVICA 280 MG TABS TAKE 1 TABLET BY MOUTH DAILY. 28 tablet 6  . levothyroxine (SYNTHROID, LEVOTHROID) 75 MCG tablet Take 75 mcg by mouth daily before breakfast. Only takes Saturday- Thursday. Hold synthroid  on Friday    . losartan (COZAAR) 100 MG tablet Take 100 mg by mouth daily.     No current facility-administered medications for this visit.     PHYSICAL EXAMINATION: ECOG PERFORMANCE STATUS: 2 - Symptomatic, <50% confined to bed  BP (!) 148/63 (BP Location: Left Arm, Patient Position: Sitting, Cuff Size: Normal)   Pulse (!) 103   Temp 97.6 F (36.4 C) (Tympanic)   Resp 16   Wt 136 lb 3.2 oz (61.8 kg)   BMI 24.91 kg/m   Filed Weights   02/17/18 1127  Weight: 136 lb 3.2 oz (61.8 kg)    Physical Exam  Constitutional: She is oriented to person, place, and time and well-developed, well-nourished, and in no distress.  Elderly frail female patient she is walking with a walker.  Accompanied by her sister.  HENT:  Head: Normocephalic and atraumatic.  Mouth/Throat: Oropharynx is clear and moist. No oropharyngeal exudate.  Eyes: Pupils are equal, round, and reactive to light.  Neck: Normal range of motion. Neck supple.  Cardiovascular: Normal rate and regular rhythm.  Pulmonary/Chest: No respiratory distress. She has no wheezes.  Abdominal: Soft. Bowel sounds are normal. She exhibits no distension and no mass. There is no abdominal tenderness. There is no rebound and no guarding.  Musculoskeletal: Normal range of motion.        General: No tenderness or edema.     Comments: Mild bilateral leg swelling.  Neurological: She is alert and oriented to person, place, and time.  Skin: Skin is warm.  Psychiatric: Affect normal.      LABORATORY DATA:  I have reviewed the data as listed    Component Value Date/Time   NA 134 (L) 02/23/2018 0355   NA 133 (L) 05/08/2014 1349   K 3.6 02/23/2018 0355   K 3.6 05/08/2014 1349   CL 103 02/23/2018 0355   CL 102 05/08/2014 1349   CO2 27 02/23/2018 0355   CO2 26 05/08/2014 1349   GLUCOSE 108 (H) 02/23/2018 0355   GLUCOSE 82 05/08/2014 1349   BUN 11 02/23/2018 0355   BUN 18 05/08/2014 1349   CREATININE 0.70 02/23/2018 0355   CREATININE  1.13 (H) 05/08/2014 1349   CALCIUM 8.1 (L) 02/23/2018 0355   CALCIUM 9.1 05/08/2014 1349   PROT 5.2 (L) 02/23/2018 0355   PROT 6.7 05/08/2014 1349   ALBUMIN 3.0 (L) 02/23/2018 0355   ALBUMIN 4.2 05/08/2014 1349   AST 37 02/23/2018 0355   AST 24 05/08/2014 1349   ALT 17 02/23/2018 0355   ALT 13 (L) 05/08/2014 1349   ALKPHOS 26 (L) 02/23/2018 0355   ALKPHOS 41 05/08/2014 1349   BILITOT 1.6 (H) 02/23/2018 0355   BILITOT 1.3 (H) 05/08/2014 1349   GFRNONAA >60 02/23/2018 0355   GFRNONAA 45 (L) 05/08/2014 1349   GFRAA >60 02/23/2018 0355   GFRAA 52 (L) 05/08/2014 1349    No results found for: SPEP, UPEP  Lab Results  Component Value Date   WBC 1.9 (L) 02/23/2018   NEUTROABS 0.6 (L) 02/23/2018   HGB 10.5 (L) 02/23/2018   HCT 32.9 (L) 02/23/2018   MCV 88.0 02/23/2018  PLT 91 (L) 02/23/2018      Chemistry      Component Value Date/Time   NA 134 (L) 02/23/2018 0355   NA 133 (L) 05/08/2014 1349   K 3.6 02/23/2018 0355   K 3.6 05/08/2014 1349   CL 103 02/23/2018 0355   CL 102 05/08/2014 1349   CO2 27 02/23/2018 0355   CO2 26 05/08/2014 1349   BUN 11 02/23/2018 0355   BUN 18 05/08/2014 1349   CREATININE 0.70 02/23/2018 0355   CREATININE 1.13 (H) 05/08/2014 1349      Component Value Date/Time   CALCIUM 8.1 (L) 02/23/2018 0355   CALCIUM 9.1 05/08/2014 1349   ALKPHOS 26 (L) 02/23/2018 0355   ALKPHOS 41 05/08/2014 1349   AST 37 02/23/2018 0355   AST 24 05/08/2014 1349   ALT 17 02/23/2018 0355   ALT 13 (L) 05/08/2014 1349   BILITOT 1.6 (H) 02/23/2018 0355   BILITOT 1.3 (H) 05/08/2014 1349       RADIOGRAPHIC STUDIES: I have personally reviewed the radiological images as listed and agreed with the findings in the report. No results found.   ASSESSMENT & PLAN:  Chronic lymphocytic leukemia ([place]) # CLL-on ibrutinib.  PET scan OCT 2019- moderate splenomegaly; slightly increased in size [compared to CT scan July 2019]; January 2020 MRI-approximately 11 cm splenic  lesion/chronic but slowly growing; also about inch in size splenic lesion-new.   #Clinical concerns for progression-again reviewed bone marrow biopsy patient agrees.  #Mild leukopenia/mild thrombocytopenia-normal hemoglobin-question ibrutinib versus others.  See above.  #Weight loss of 20 pounds over the last 3 to 4 months.  Weight about 1 biopsy.  # Hypertension-secondary to ibrutinib; stable.   #Debility-gait instability secondary to progressive disease-recommend home health.   # DISPOSITION:  # Bone marrow Biopsy asap # follow up with me in 1 week after bone marrow biopsy- MD/labs- cbc/bmp/ldh- Dr.B  CC: Dr.Tate.    Orders Placed This Encounter  Procedures  . CT BONE MARROW BIOPSY & ASPIRATION    Comprehensive morphologic evaluation of bone marrow biopsy/ aspirate; flow cytometry; cytogenetics; FISH others- as deemed appropriate by the pathologist. Please call me- if any question- at [phone].    Standing Status:   Future    Number of Occurrences:   1    Standing Expiration Date:   05/19/2019    Order Specific Question:   Reason for Exam (SYMPTOM  OR DIAGNOSIS REQUIRED)    Answer:   leucopenia/thrombocytopenia; CLL    Order Specific Question:   Preferred imaging location?    Answer:   [facility]    Order Specific Question:   Radiology Contrast Protocol - do NOT remove file path    Answer:   [email or web address]  . CBC with Differential/Platelet    Standing Status:   Future    Standing Expiration Date:   02/18/2019  . Basic metabolic panel    Standing Status:   Future    Standing Expiration Date:   02/18/2019  . Lactate dehydrogenase    Standing Status:   Future    Standing Expiration Date:   02/18/2019   All questions were answered. The patient knows to call the clinic with any problems, questions or concerns.      Cammie Sickle, MD 02/25/2018 2:54 PM

## 2018-02-17 NOTE — Assessment & Plan Note (Addendum)
#  CLL-on ibrutinib.  PET scan OCT 2019- moderate splenomegaly; slightly increased in size [compared to CT scan July 2019]; January 2020 MRI-approximately 11 cm splenic lesion/chronic but slowly growing; also about inch in size splenic lesion-new.   #Clinical concerns for progression-again reviewed bone marrow biopsy patient agrees.  #Mild leukopenia/mild thrombocytopenia-normal hemoglobin-question ibrutinib versus others.  See above.  #Weight loss of 20 pounds over the last 3 to 4 months. Await above biopsy.  # Hypertension-secondary to ibrutinib; stable.   #Debility-gait instability secondary to progressive disease-recommend home health.   # DISPOSITION:  # Bone marrow Biopsy asap # follow up with me in 1 week after bone marrow biopsy- MD/labs- cbc/bmp/ldh- Dr.B  CC: Dr.Tate.

## 2018-02-18 ENCOUNTER — Other Ambulatory Visit: Payer: Self-pay | Admitting: Nurse Practitioner

## 2018-02-18 ENCOUNTER — Other Ambulatory Visit: Payer: Self-pay

## 2018-02-18 ENCOUNTER — Emergency Department: Payer: Medicare Other

## 2018-02-18 ENCOUNTER — Other Ambulatory Visit: Payer: Self-pay | Admitting: *Deleted

## 2018-02-18 ENCOUNTER — Telehealth: Payer: Self-pay | Admitting: *Deleted

## 2018-02-18 ENCOUNTER — Emergency Department
Admission: EM | Admit: 2018-02-18 | Discharge: 2018-02-18 | Disposition: A | Discharge status: Home / Self Care | Payer: Medicare Other | Attending: Emergency Medicine | Admitting: Emergency Medicine

## 2018-02-18 DIAGNOSIS — E039 Hypothyroidism, unspecified: Secondary | ICD-10-CM | POA: Insufficient documentation

## 2018-02-18 DIAGNOSIS — C9110 Chronic lymphocytic leukemia of B-cell type not having achieved remission: Secondary | ICD-10-CM

## 2018-02-18 DIAGNOSIS — Z79899 Other long term (current) drug therapy: Secondary | ICD-10-CM | POA: Insufficient documentation

## 2018-02-18 DIAGNOSIS — R531 Weakness: Secondary | ICD-10-CM | POA: Insufficient documentation

## 2018-02-18 DIAGNOSIS — W19XXXA Unspecified fall, initial encounter: Secondary | ICD-10-CM

## 2018-02-18 DIAGNOSIS — I10 Essential (primary) hypertension: Secondary | ICD-10-CM | POA: Diagnosis not present

## 2018-02-18 LAB — URINALYSIS, COMPLETE (UACMP) WITH MICROSCOPIC
Bacteria, UA: NONE SEEN
Bilirubin Urine: NEGATIVE
GLUCOSE, UA: NEGATIVE mg/dL
Hgb urine dipstick: NEGATIVE
Ketones, ur: NEGATIVE mg/dL
Leukocytes, UA: NEGATIVE
Nitrite: NEGATIVE
Protein, ur: NEGATIVE mg/dL
Specific Gravity, Urine: 1.008 (ref 1.005–1.030)
Squamous Epithelial / HPF: NONE SEEN (ref 0–5)
pH: 7 (ref 5.0–8.0)

## 2018-02-18 LAB — CBC WITH DIFFERENTIAL/PLATELET
ABS IMMATURE GRANULOCYTES: 0.02 10*3/uL (ref 0.00–0.07)
Basophils Absolute: 0 10*3/uL (ref 0.0–0.1)
Basophils Relative: 1 %
Eosinophils Absolute: 0 10*3/uL (ref 0.0–0.5)
Eosinophils Relative: 0 %
HCT: 35.2 % — ABNORMAL LOW (ref 36.0–46.0)
Hemoglobin: 11.5 g/dL — ABNORMAL LOW (ref 12.0–15.0)
Immature Granulocytes: 1 %
Lymphocytes Relative: 50 %
Lymphs Abs: 1 10*3/uL (ref 0.7–4.0)
MCH: 28.5 pg (ref 26.0–34.0)
MCHC: 32.7 g/dL (ref 30.0–36.0)
MCV: 87.3 fL (ref 80.0–100.0)
Monocytes Absolute: 0.2 10*3/uL (ref 0.1–1.0)
Monocytes Relative: 12 %
NEUTROS ABS: 0.7 10*3/uL — AB (ref 1.7–7.7)
Neutrophils Relative %: 36 %
Platelets: 103 10*3/uL — ABNORMAL LOW (ref 150–400)
RBC: 4.03 MIL/uL (ref 3.87–5.11)
RDW: 13.7 % (ref 11.5–15.5)
WBC: 2 10*3/uL — ABNORMAL LOW (ref 4.0–10.5)
nRBC: 0 % (ref 0.0–0.2)

## 2018-02-18 LAB — BASIC METABOLIC PANEL
Anion gap: 7 (ref 5–15)
BUN: 11 mg/dL (ref 8–23)
CO2: 25 mmol/L (ref 22–32)
Calcium: 8.5 mg/dL — ABNORMAL LOW (ref 8.9–10.3)
Chloride: 97 mmol/L — ABNORMAL LOW (ref 98–111)
Creatinine, Ser: 0.87 mg/dL (ref 0.44–1.00)
GFR calc Af Amer: >60 mL/min (ref >60)
GFR, EST NON AFRICAN AMERICAN: 60 mL/min — AB (ref >60)
Glucose, Bld: 110 mg/dL — ABNORMAL HIGH (ref 70–99)
POTASSIUM: 3.9 mmol/L (ref 3.5–5.1)
Sodium: 129 mmol/L — ABNORMAL LOW (ref 135–145)

## 2018-02-18 NOTE — Telephone Encounter (Signed)
)   Ref Range & Units 1d ago 2wk ago 71mo ago 28mo ago 30mo ago 80mo ago 30mo ago  WBC 4.0 - 10.5 K/uL 2.7Low   2.6Low   2.3Low   2.6Low   3.3Low  R 2.3Low  R 2.5Low  R  RBC 3.87 - 5.11 MIL/uL 4.65  4.61  4.51  4.85  4.53 R 4.81 R 4.76 R  Hemoglobin 12.0 - 15.0 g/dL 13.1  12.9  13.0  14.0  13.2 R 14.6 R 14.4 R  HCT 36.0 - 46.0 % 40.7  40.2  39.7  42.9  39.5 R 42.5 R 42.2 R  MCV 80.0 - 100.0 fL 87.5  87.2  88.0  88.5  87.2  88.4  88.7   MCH 26.0 - 34.0 pg 28.2  28.0  28.8  28.9  29.2  30.5  30.2   MCHC 30.0 - 36.0 g/dL 32.2  32.1  32.7  32.6  33.4 R 34.5 R 34.0 R  RDW 11.5 - 15.5 % 13.7  13.4  13.8  13.2  14.4 R 14.0 R 14.2 R  Platelets 150 - 400 K/uL 119Low   115Low   96Low   90Low  CM 98Low  R 112Low  R 111Low  R  nRBC 0.0 - 0.2 % 0.0  0.0  0.0  0.0      Neutrophils Relative % % 32  28  30  26  25  31   32   Neutro Abs 1.7 - 7.7 K/uL 0.9Low   0.7Low   0.7Low   0.7Low   0.8Low  R 0.7Low  R 0.8Low  R  Lymphocytes Relative % 56  55  54  61  62  54  53   Lymphs Abs 0.7 - 4.0 K/uL 1.5  1.4  1.2  1.6  2.0 R 1.3 R 1.3 R  Monocytes Relative % 10  11  13  11  11  14  14    Monocytes Absolute 0.1 - 1.0 K/uL 0.3  0.3  0.3  0.3  0.4 R 0.3 R 0.3 R  Eosinophils Relative % 0  0  0  0  2  1  0   Eosinophils Absolute 0.0 - 0.5 K/uL 0.0  0.0  0.0  0.0  0.1 R 0.0 R 0.0 R  Basophils Relative % 1  1  0  1  0  0  1   Basophils Absolute 0.0 - 0.1 K/uL 0.0  0.0  0.0  0.0  0.0 R, CM 0.0 R, CM 0.0 R, CM  WBC Morphology  DIFF CONFIRMED BY MANUAL  DIFF CONFIRMED BY MANUAL        Immature Granulocytes % 1  5  3  1       Abs Immature Granulocytes 0.00 - 0.07 K/uL 0.03  0.13High  CM 0.07 CM 0.03 CM     Comment: Performed at [facility], [address] [place], [place] [address]  [place]  [facility] CL[facility] LAB [place] [facility] [place] [facility] [place] [facility] [place] [facility] [place] [facility]      Specimen Collected: 02/17/18 10:51  Last Resulted: 02/17/18 11:32     Lab Flowsheet    Order Details    View Encounter     Lab and Collection Details    Routing    Result History      CM=Additional commentsR=Reference range differs from displayed range      Other Results from 02/17/2018  Contains abnormal data Lactate dehydrogenase  Order: [id number]   Status:  Final result  Visible to patient:  No (Not Released)  Next appt:  02/21/2018 at 08:30 AM in Radiology ([facility])  Dx:  Chronic lymphocytic leukemia ([place])   Ref Range & Units 1d ago (02/17/18) 2wk ago (02/02/18) 25mo ago (01/07/18) 51mo ago (01/07/18) 78mo ago (10/29/17) 44mo ago (07/30/17) 71mo ago (06/18/17)  LDH 98 - 192 U/L 325High   315High  CM 290High  CM 286High  CM 280High  CM 232High  CM 235High  CM  Comment: Performed at [facility], [address] [place], [place] [address]  Resulting Agency  [place] CLIN LAB [place] [facility] [place] CL[facility] LAB [place] [facility] LAB [place] [facility] [place] [facility] [place] [facility] LAB      Specimen Collected: 02/17/18 10:51  Last Resulted: 02/17/18 11:31     Lab Flowsheet    Order Details    View Encounter    Lab and Collection Details    Routing    Result History      CM=Additional comments        Contains abnormal data Comprehensive metabolic panel  Order: [id number]   Status:  Final result  Visible to patient:  No (Not Released)  Next appt:  02/21/2018 at 08:30 AM in Radiology ([facility])  Dx:  Chronic lymphocytic leukemia ([place])   Ref Range & Units 1d ago 95mo ago 15mo ago 32mo ago 73mo ago 23mo ago 53mo ago  Sodium 135 - 145 mmol/L 132Low   139  139  137  139  137  138   Potassium 3.5 - 5.1 mmol/L 4.0  3.5  3.7  3.7  3.7  3.5  3.5   Chloride 98 - 111 mmol/L 98  106  106  106  107 CM 105 R 106 R  CO2 22 - 32 mmol/L 26  24  26   21Low   23  23  24    Glucose, Bld 70 - 99 mg/dL 97  94  92  108High   108High  CM 117High  R 139High  R  BUN 8 - 23 mg/dL 10  14  12  12  10  CM 14 R 10 R  Creatinine, Ser 0.44 - 1.00 mg/dL 0.80  0.95  0.93  1.00  0.94  0.83  0.86   Calcium 8.9 - 10.3 mg/dL 9.0  8.8Low   9.5  10.1   9.2  9.5  9.1   Total Protein 6.5 - 8.1 g/dL 6.9  6.3Low   6.5   6.5  7.0  6.4Low    Albumin 3.5 - 5.0 g/dL 4.0  4.0  4.3   4.2  4.5  4.0   AST 15 - 41 U/L 43High   30  34   28  31  27    ALT 0 - 44 U/L 23  17  17   18  CM 18 R 13Low  R  Alkaline Phosphatase 38 - 126 U/L 36Low   34Low   30Low    38  37Low   39   Total Bilirubin 0.3 - 1.2 mg/dL 1.7High   1.5High   1.3High    1.5High   1.2  1.4High    GFR calc non Af Amer >60 mL/min >60  54Low   54Low   49Low   53Low   >60  59Low    GFR calc Af Amer >60 mL/min >60  >60  >60 CM 57Low  CM >60 CM >60  CM >60 CM  Anion gap 5 - 15 8  9  CM 7 CM 10 CM 9 CM 9 CM 8 CM  Comment: Performed at [facility], [address] [place], [place] [address]  Resulting Agency  [facility] CLIN LAB [place] CL[facility] LAB [place] [facility] [place] [facility] [place] [facility] [place] [facility] [place] [facility]      Specimen Collected: 02/17/18 10:51  Last Resulted: 02/17/18 11:31

## 2018-02-18 NOTE — ED Triage Notes (Signed)
Pt to ED via EMS from home after a mechanical fall. No pain, no LOC, Pt was able to ambulate after fall. Pt origially refused transportation to the ED but then started to become dizzy again.   130 144/56 94% RA 103 HR  Leukemia

## 2018-02-18 NOTE — Telephone Encounter (Signed)
Contacted patient with bone marrow biopsy date/time for Monday 02/21/2018 with arrival time at 730 am - procedure at 830 am. Patient agreeable. She was also educated to be NPO prior to the procedure. Teach back process performed with patient. Contacted speciality scheduling to confirm that pt is accepting this appointment.

## 2018-02-18 NOTE — Telephone Encounter (Signed)
This is a duplicat request

## 2018-02-18 NOTE — ED Notes (Signed)
Walker given to pt. Pt was able to ambulate with minimal assistance.

## 2018-02-18 NOTE — Discharge Instructions (Addendum)
Please stay with family tonight, tomorrow.  We are trying to arrange to have outpatient home health start for you on Monday.  Only walk using a walker.  We also advised that you obtain a life alert in case you do fall.  Return to the emergency room if you feel worse or have new or worrisome symptoms.

## 2018-02-18 NOTE — ED Provider Notes (Addendum)
[facility] Emergency Department Provider Note  ____________________________________________   I have reviewed the triage vital signs and the nursing notes. Where available I have reviewed prior notes and, if possible and indicated, outside hospital notes.    HISTORY  Chief Complaint Fall    HPI Heather [name] is a 85 y.o. female  With a history of palpitation, "risk for falls, thrombocytopenia, hypertension, history of CLL in the past, presents today complaining of a fall.  According to EMS and patient was a mechanical fall she tripped on the way to the bathroom.  She thinks "maybe I hit my head I am not sure".  Initially there were no complaints of injury and they were not going to transfer her but then she decided she wanted to come in.  She states she always feels weak and she does not feel much different today.  She denies any fever chills nausea or vomiting.  She denies dysuria urinary frequency.   Past Medical History:  Diagnosis Date  . Anemia   . Cataracts, bilateral   . Chronic lymphocytic leukemia ([place]) 06/06/2014  . Hearing loss   . History of chemotherapy   . Hyperlipidemia   . Hypertension   . Hypothyroidism   . Palpitations   . Risk for falls   . Thrombocytopenia [facility])     Patient Active Problem List   Diagnosis Date Noted  . Sepsis ([place]) 02/15/2017  . Chronic lymphocytic leukemia ([place]) 06/06/2014    Past Surgical History:  Procedure Laterality Date  . CATARACT EXTRACTION Left 03/29/2012  . Excision left cervical node biopsy  05/04/2011    Prior to Admission medications   Medication Sig Start Date End Date Taking? Authorizing Provider  hydrALAZINE (APRESOLINE) 25 MG tablet TAKE 1 TABLET (25 MG TOTAL) BY MOUTH 2 (TWO) TIMES DAILY. 10/04/17   Cammie Sickle, MD  IMBRUVICA 280 MG TABS TAKE 1 TABLET BY MOUTH DAILY. 06/28/17   Verlon Au, NP  levothyroxine (SYNTHROID, LEVOTHROID) 75 MCG tablet Take 75 mcg by mouth daily before  breakfast. Only takes Saturday- Thursday. Hold synthroid on Friday 05/08/14   [provider]  losartan (COZAAR) 100 MG tablet Take 100 mg by mouth daily.    [provider]    Allergies Patient has no known allergies.  Family History  Problem Relation Age of Onset  . Heart disease Mother   . Stomach cancer Father   . Diabetes Sister     Social History Social History   Tobacco Use  . Smoking status: Never Smoker  . Smokeless tobacco: Never Used  Substance Use Topics  . Alcohol use: No    Alcohol/week: 0.0 standard drinks  . Drug use: No    Review of Systems Constitutional: No fever/chills Eyes: No visual changes. ENT: No sore throat. No stiff neck no neck pain Cardiovascular: Denies chest pain. Respiratory: Denies shortness of breath. Gastrointestinal:   no vomiting.  No diarrhea.  No constipation. Genitourinary: Negative for dysuria. Musculoskeletal: Negative lower extremity swelling Skin: Negative for rash. Neurological: Negative for severe headaches, focal weakness or numbness.   ____________________________________________   PHYSICAL EXAM:  VITAL SIGNS: ED Triage Vitals  Enc Vitals Group     BP 02/18/18 1919 (!) 163/71     Pulse Rate 02/18/18 1919 99     Resp 02/18/18 1919 16     Temp 02/18/18 1919 98.8 F (37.1 C)     Temp Source 02/18/18 1919 Oral     SpO2 02/18/18 1919  95 %     Weight 02/18/18 1915 135 lb (61.2 kg)     Height 02/18/18 1915 5' (1.524 m)     Head Circumference --      Peak Flow --      Pain Score 02/18/18 1915 0     Pain Loc --      Pain Edu? --      Excl. in [place]? --     Constitutional: Alert and oriented in no acute distress.  Patient with no evidence of acute toxidrome Eyes: Conjunctivae are normal Head: Atraumatic HEENT: No congestion/rhinnorhea. Mucous membranes are moist.  Oropharynx non-erythematous Neck:   Nontender with no meningismus, no masses, no stridor Cardiovascular: Normal rate, regular rhythm.  Grossly normal heart sounds.  Good peripheral circulation. Respiratory: Normal respiratory effort.  No retractions. Lungs CTAB. Abdominal: Soft and nontender. No distention. No guarding no rebound Back:  There is no focal tenderness or step off.  there is no midline tenderness there are no lesions noted. there is no CVA tenderness Musculoskeletal: No lower extremity tenderness, no upper extremity tenderness. No joint effusions, no DVT signs strong distal pulses no edema Neurologic:  Normal speech and language. No gross focal neurologic deficits are appreciated.  Skin:  Skin is warm, dry and intact. No rash noted. Psychiatric: Mood and affect are normal. Speech and behavior are normal.  ____________________________________________   LABS (all labs ordered are listed, but only abnormal results are displayed)  Labs Reviewed  URINALYSIS, COMPLETE (UACMP) WITH MICROSCOPIC  CBC WITH DIFFERENTIAL/PLATELET  BASIC METABOLIC PANEL    Pertinent labs  results that were available during my care of the patient were reviewed by me and considered in my medical decision making (see chart for details). ____________________________________________  EKG  I personally interpreted any EKGs ordered by me or triage Sinus rhythm rate 87 bpm no acute ST elevation or depression, normal axis unremarkable EKG ____________________________________________  RADIOLOGY  Pertinent labs & imaging results that were available during my care of the patient were reviewed by me and considered in my medical decision making (see chart for details). If possible, patient and/or family made aware of any abnormal findings.  No results found. ____________________________________________    PROCEDURES  Procedure(s) performed: None  Procedures  Critical Care performed: None  ____________________________________________   INITIAL IMPRESSION / ASSESSMENT AND PLAN / ED COURSE  Pertinent labs & imaging results that were  available during my care of the patient were reviewed by me and considered in my medical decision making (see chart for details).  Patient here with a reported non-syncopal fall, does have history of thrombocytopenia so check basic blood work and a CT scan, does not appear to be in any acute distress no evidence of hip fracture, we will check for urinalysis as she states she always feels weak and is hard to tell if there is something else going on.  However, overall well-appearing.  ----------------------------------------- 9:11 PM on 02/18/2018 -----------------------------------------  Is in no acute distress she is able to ambulate she wants to go home she is eating and drinking, she does live by herself, I am a little concerned about that so I will see about trying to arrange for home health.  I have asked family to stay with her tonight.  We will put in a face-to-face for home PT, nurse aide and nurse to see about her living arrangements and if she can the helped there.  She does have some evidence of poor body habitus which I think  she would benefit from nursing help    ____________________________________________   FINAL CLINICAL IMPRESSION(S) / ED DIAGNOSES  Final diagnoses:  None      This chart was dictated using voice recognition software.  Despite best efforts to proofread,  errors can occur which can change meaning.      Schuyler Amor, MD 02/18/18 1925    Schuyler Amor, MD 02/18/18 2112

## 2018-02-21 ENCOUNTER — Other Ambulatory Visit (HOSPITAL_COMMUNITY)
Admission: RE | Admit: 2018-02-21 | Disposition: A | Discharge status: Home / Self Care | Payer: Medicare Other | Source: Ambulatory Visit | Attending: Internal Medicine | Admitting: Internal Medicine

## 2018-02-21 ENCOUNTER — Ambulatory Visit
Admission: RE | Admit: 2018-02-21 | Discharge: 2018-02-21 | Disposition: A | Discharge status: Home / Self Care | Payer: Medicare Other | Source: Ambulatory Visit | Attending: Internal Medicine | Admitting: Internal Medicine

## 2018-02-21 ENCOUNTER — Inpatient Hospital Stay (HOSPITAL_COMMUNITY): Admission: RE | Admit: 2018-02-21 | Payer: Self-pay | Source: Home / Self Care

## 2018-02-21 DIAGNOSIS — C9110 Chronic lymphocytic leukemia of B-cell type not having achieved remission: Secondary | ICD-10-CM | POA: Insufficient documentation

## 2018-02-21 DIAGNOSIS — D61818 Other pancytopenia: Secondary | ICD-10-CM | POA: Insufficient documentation

## 2018-02-21 LAB — CBC WITH DIFFERENTIAL/PLATELET
Abs Immature Granulocytes: 0.1 10*3/uL — ABNORMAL HIGH (ref 0.00–0.07)
Basophils Absolute: 0 10*3/uL (ref 0.0–0.1)
Basophils Relative: 1 %
EOS ABS: 0 10*3/uL (ref 0.0–0.5)
Eosinophils Relative: 0 %
HCT: 38.5 % (ref 36.0–46.0)
Hemoglobin: 12.4 g/dL (ref 12.0–15.0)
Immature Granulocytes: 5 %
Lymphocytes Relative: 51 %
Lymphs Abs: 1.1 10*3/uL (ref 0.7–4.0)
MCH: 28.4 pg (ref 26.0–34.0)
MCHC: 32.2 g/dL (ref 30.0–36.0)
MCV: 88.3 fL (ref 80.0–100.0)
Monocytes Absolute: 0.2 10*3/uL (ref 0.1–1.0)
Monocytes Relative: 11 %
Neutro Abs: 0.7 10*3/uL — ABNORMAL LOW (ref 1.7–7.7)
Neutrophils Relative %: 32 %
PLATELETS: 101 10*3/uL — AB (ref 150–400)
RBC: 4.36 MIL/uL (ref 3.87–5.11)
RDW: 13.6 % (ref 11.5–15.5)
WBC: 2.2 10*3/uL — ABNORMAL LOW (ref 4.0–10.5)
nRBC: 0 % (ref 0.0–0.2)

## 2018-02-21 LAB — PROTIME-INR
INR: 1.17
Prothrombin Time: 14.8 seconds (ref 11.4–15.2)

## 2018-02-21 MED ORDER — HEPARIN SOD (PORK) LOCK FLUSH 100 UNIT/ML IV SOLN
INTRAVENOUS | Status: AC
  Filled 2018-02-21: qty 5

## 2018-02-21 MED ORDER — MIDAZOLAM HCL 5 MG/5ML IJ SOLN
INTRAMUSCULAR | Status: AC
  Filled 2018-02-21: qty 5

## 2018-02-21 MED ORDER — FENTANYL CITRATE (PF) 100 MCG/2ML IJ SOLN
INTRAMUSCULAR | Status: AC | PRN
  Administered 2018-02-21: 25 ug via INTRAVENOUS

## 2018-02-21 MED ORDER — HYDROCODONE-ACETAMINOPHEN 5-325 MG PO TABS: 1.0000 | ORAL_TABLET | ORAL | Status: DC | PRN

## 2018-02-21 MED ORDER — MIDAZOLAM HCL 5 MG/5ML IJ SOLN
INTRAMUSCULAR | Status: AC | PRN
  Administered 2018-02-21: 0.5 mg via INTRAVENOUS

## 2018-02-21 MED ORDER — FENTANYL CITRATE (PF) 100 MCG/2ML IJ SOLN
INTRAMUSCULAR | Status: AC
  Filled 2018-02-21: qty 2

## 2018-02-21 NOTE — Procedures (Signed)
  Procedure: CT bone marrow biopsy R iliac EBL:   minimal Complications:  none immediate  See full dictation in [facility].  Dillard Cannon MD Main # [phone] Pager  [phone]

## 2018-02-21 NOTE — Sedation Documentation (Signed)
Total sedation: Versed 0.5 mg IV, Fentanyl 25 mcg IV. Pt. Tolerated procedure well.

## 2018-02-21 NOTE — Discharge Instructions (Signed)
Bone Marrow Aspiration and Bone Marrow Biopsy, Adult, Care After °This sheet gives you information about how to care for yourself after your procedure. If you have problems or questions, contact your health care provider. ° °What can I expect after the procedure? ° °After the procedure, it is common to have: °· Mild pain and tenderness. °· Swelling. °· Bruising. ° °Follow these instructions at home: °· Take over-the-counter or prescription medicines only as told by your health care provider. °· You may shower tomorrow °? Remove band aid tomorrow, replace with site has any drainage from biopsy site. °? Wash your hands with soap and water before you touch your biopsy site  If soap and water are not available, use hand sanitizer. °? Change your dressing frequently for bleeding and/or drainage. °· Check your puncture site every day for signs of infection. Check for: °? More redness, swelling, or pain. °? More fluid or blood. °? Warmth. °? Pus or a bad smell. °· Return to your normal activities in 24hours.  °· Do not drive for 24 hours if you were given a medicine to help you relax (sedative). °· Keep all follow-up visits as told by your health care provider. This is important. °Contact a health care provider if: °· You have more redness, swelling, or pain around the puncture site. °· You have more fluid or blood coming from the puncture site. °· Your puncture site feels warm to the touch. °· You have pus or a bad smell coming from the puncture site. °· You have a fever. °· Your pain is not controlled with medicine. °This information is not intended to replace advice given to you by your health care provider. Make sure you discuss any questions you have with your health care provider. °Document Released: 07/25/2004 Document Revised: 07/26/2015 Document Reviewed: 06/19/2015 °Elsevier Interactive Patient Education © 2018 [employer] °

## 2018-02-21 NOTE — Telephone Encounter (Signed)
Discussed with Dr. Rogue Bussing who advises, not refilling at this time as he has discussed proceeding with bone marrow.

## 2018-02-23 ENCOUNTER — Emergency Department
Admission: EM | Admit: 2018-02-23 | Discharge: 2018-02-23 | Disposition: A | Discharge status: Home / Self Care | Payer: Medicare Other | Attending: Emergency Medicine | Admitting: Emergency Medicine

## 2018-02-23 ENCOUNTER — Telehealth: Payer: Self-pay | Admitting: *Deleted

## 2018-02-23 ENCOUNTER — Other Ambulatory Visit: Payer: Self-pay

## 2018-02-23 DIAGNOSIS — Z79899 Other long term (current) drug therapy: Secondary | ICD-10-CM | POA: Insufficient documentation

## 2018-02-23 DIAGNOSIS — E039 Hypothyroidism, unspecified: Secondary | ICD-10-CM | POA: Insufficient documentation

## 2018-02-23 DIAGNOSIS — I10 Essential (primary) hypertension: Secondary | ICD-10-CM | POA: Diagnosis not present

## 2018-02-23 DIAGNOSIS — R531 Weakness: Secondary | ICD-10-CM

## 2018-02-23 DIAGNOSIS — M6281 Muscle weakness (generalized): Secondary | ICD-10-CM | POA: Insufficient documentation

## 2018-02-23 LAB — MAGNESIUM: Magnesium: 2 mg/dL (ref 1.7–2.4)

## 2018-02-23 LAB — TROPONIN I: Troponin I: <0.03 ng/mL (ref <0.03)

## 2018-02-23 LAB — CBC WITH DIFFERENTIAL/PLATELET
ABS IMMATURE GRANULOCYTES: 0.05 10*3/uL (ref 0.00–0.07)
BASOS PCT: 1 %
Basophils Absolute: 0 10*3/uL (ref 0.0–0.1)
EOS ABS: 0 10*3/uL (ref 0.0–0.5)
Eosinophils Relative: 1 %
HCT: 32.9 % — ABNORMAL LOW (ref 36.0–46.0)
Hemoglobin: 10.5 g/dL — ABNORMAL LOW (ref 12.0–15.0)
Immature Granulocytes: 3 %
Lymphocytes Relative: 53 %
Lymphs Abs: 1 10*3/uL (ref 0.7–4.0)
MCH: 28.1 pg (ref 26.0–34.0)
MCHC: 31.9 g/dL (ref 30.0–36.0)
MCV: 88 fL (ref 80.0–100.0)
Monocytes Absolute: 0.3 10*3/uL (ref 0.1–1.0)
Monocytes Relative: 13 %
Neutro Abs: 0.6 10*3/uL — ABNORMAL LOW (ref 1.7–7.7)
Neutrophils Relative %: 29 %
Platelets: 91 10*3/uL — ABNORMAL LOW (ref 150–400)
RBC: 3.74 MIL/uL — ABNORMAL LOW (ref 3.87–5.11)
RDW: 13.6 % (ref 11.5–15.5)
WBC: 1.9 10*3/uL — ABNORMAL LOW (ref 4.0–10.5)
nRBC: 0 % (ref 0.0–0.2)

## 2018-02-23 LAB — COMPREHENSIVE METABOLIC PANEL
ALT: 17 U/L (ref 0–44)
AST: 37 U/L (ref 15–41)
Albumin: 3 g/dL — ABNORMAL LOW (ref 3.5–5.0)
Alkaline Phosphatase: 26 U/L — ABNORMAL LOW (ref 38–126)
Anion gap: 4 — ABNORMAL LOW (ref 5–15)
BUN: 11 mg/dL (ref 8–23)
CO2: 27 mmol/L (ref 22–32)
CREATININE: 0.7 mg/dL (ref 0.44–1.00)
Calcium: 8.1 mg/dL — ABNORMAL LOW (ref 8.9–10.3)
Chloride: 103 mmol/L (ref 98–111)
GFR calc Af Amer: >60 mL/min (ref >60)
GFR calc non Af Amer: >60 mL/min (ref >60)
Glucose, Bld: 108 mg/dL — ABNORMAL HIGH (ref 70–99)
Potassium: 3.6 mmol/L (ref 3.5–5.1)
Sodium: 134 mmol/L — ABNORMAL LOW (ref 135–145)
Total Bilirubin: 1.6 mg/dL — ABNORMAL HIGH (ref 0.3–1.2)
Total Protein: 5.2 g/dL — ABNORMAL LOW (ref 6.5–8.1)

## 2018-02-23 LAB — LACTIC ACID, PLASMA: Lactic Acid, Venous: 0.7 mmol/L (ref 0.5–1.9)

## 2018-02-23 NOTE — ED Notes (Signed)
Pt alert and oriented X4, active, cooperative, pt in NAD. RR even and unlabored, color WNL.  Pt informed to return if any life threatening symptoms occur.  Discharge and followup instructions reviewed. Ambulates to wheelchair safely. Left with all of belongings including purse.

## 2018-02-23 NOTE — ED Triage Notes (Signed)
Patient arrived by [facility] from [facility]. Patient has Hx leukemia was seen in at Cancer center 02/21/2018 and since then has not been feeling well. Patient states, "just don't feel good." Patient denies chest pain, or pain anywhere else in body. Patient is alert and oriented.

## 2018-02-23 NOTE — ED Provider Notes (Signed)
[facility] Emergency Department Provider Note  ____________________________________________   First MD Initiated Contact with Patient 02/23/18 0424     (approximate)  I have reviewed the triage vital signs and the nursing notes.   HISTORY  Chief Complaint Weakness    HPI Heather [name] is a 85 y.o. female with a history of CLL which recently has become more of an issue for her and for which she sees Dr. Rogue Bussing and is currently undergoing treatment.  She presents by EMS tonight for generalized weakness.  She says that she has no specific symptoms, just that she does not feel well in general and feels weak.  She denies fever/chills, chest pain, shortness of breath, nausea, vomiting, abdominal pain, and dysuria.  Nothing makes her symptoms better nor worse.  These have been going on for weeks but seem to be worse yesterday and tonight.  She has no focal weakness in either of her arms or legs, is not having any trouble speaking or finding words, and cannot provide any additional history or symptoms.  Past Medical History:  Diagnosis Date  . Anemia   . Cataracts, bilateral   . Chronic lymphocytic leukemia ([place]) 06/06/2014  . Hearing loss   . History of chemotherapy   . Hyperlipidemia   . Hypertension   . Hypothyroidism   . Palpitations   . Risk for falls   . Thrombocytopenia [facility])     Patient Active Problem List   Diagnosis Date Noted  . Sepsis ([place]) 02/15/2017  . Chronic lymphocytic leukemia ([place]) 06/06/2014    Past Surgical History:  Procedure Laterality Date  . CATARACT EXTRACTION Left 03/29/2012  . Excision left cervical node biopsy  05/04/2011    Prior to Admission medications   Medication Sig Start Date End Date Taking? Authorizing Provider  hydrALAZINE (APRESOLINE) 25 MG tablet TAKE 1 TABLET (25 MG TOTAL) BY MOUTH 2 (TWO) TIMES DAILY. 10/04/17  Yes Cammie Sickle, MD  IMBRUVICA 280 MG TABS TAKE 1 TABLET BY MOUTH DAILY. 06/28/17  Yes  Verlon Au, NP  levothyroxine (SYNTHROID, LEVOTHROID) 75 MCG tablet Take 75 mcg by mouth daily before breakfast. Only takes Saturday- Thursday. Hold synthroid on Friday 05/08/14  Yes [provider]  losartan (COZAAR) 100 MG tablet Take 100 mg by mouth daily.   Yes [provider]    Allergies Patient has no known allergies.  Family History  Problem Relation Age of Onset  . Heart disease Mother   . Stomach cancer Father   . Diabetes Sister     Social History Social History   Tobacco Use  . Smoking status: Never Smoker  . Smokeless tobacco: Never Used  Substance Use Topics  . Alcohol use: No    Alcohol/week: 0.0 standard drinks  . Drug use: No    Review of Systems Constitutional: Generalized weakness.  No fever/chills Eyes: No visual changes. ENT: No sore throat. Cardiovascular: Denies chest pain. Respiratory: Denies shortness of breath. Gastrointestinal: No abdominal pain.  No nausea, no vomiting.  No diarrhea.  No constipation. Genitourinary: Negative for dysuria. Musculoskeletal: Negative for neck pain.  Negative for back pain. Integumentary: Negative for rash. Neurological: Negative for headaches, focal weakness or numbness.   ____________________________________________   PHYSICAL EXAM:  VITAL SIGNS: ED Triage Vitals  Enc Vitals Group     BP 02/23/18 0400 (!) 136/52     Pulse Rate 02/23/18 0400 90     Resp 02/23/18 0400 (!) 8     Temp  02/23/18 0403 99 F (37.2 C)     Temp Source 02/23/18 0403 Oral     SpO2 02/23/18 0357 96 %     Weight 02/23/18 0404 61.2 kg (135 lb)     Height 02/23/18 0404 1.524 m (5')     Head Circumference --      Peak Flow --      Pain Score 02/23/18 0404 0     Pain Loc --      Pain Edu? --      Excl. in [place]? --     Constitutional: Alert and oriented.  Appears elderly and chronically ill but without any acute distress. Eyes: Conjunctivae are normal.  Head: Atraumatic. Nose: No  congestion/rhinnorhea. Mouth/Throat: Mucous membranes are moist. Neck: No stridor.  No meningeal signs.   Cardiovascular: Normal rate, regular rhythm. Good peripheral circulation. Grossly normal heart sounds. Respiratory: Normal respiratory effort.  No retractions. Lungs CTAB. Gastrointestinal: Soft and nontender. No distention.  Musculoskeletal: No lower extremity tenderness nor edema. No gross deformities of extremities. Neurologic:  Normal speech and language. No gross focal neurologic deficits are appreciated.  Skin:  Skin is warm, dry and intact. No rash noted.   ____________________________________________   LABS (all labs ordered are listed, but only abnormal results are displayed)  Labs Reviewed  CBC WITH DIFFERENTIAL/PLATELET - Abnormal; Notable for the following components:      Result Value   WBC 1.9 (*)    RBC 3.74 (*)    Hemoglobin 10.5 (*)    HCT 32.9 (*)    Platelets 91 (*)    Neutro Abs 0.6 (*)    All other components within normal limits  COMPREHENSIVE METABOLIC PANEL - Abnormal; Notable for the following components:   Sodium 134 (*)    Glucose, Bld 108 (*)    Calcium 8.1 (*)    Total Protein 5.2 (*)    Albumin 3.0 (*)    Alkaline Phosphatase 26 (*)    Total Bilirubin 1.6 (*)    Anion gap 4 (*)    All other components within normal limits  LACTIC ACID, PLASMA  MAGNESIUM  TROPONIN I  URINALYSIS, ROUTINE W REFLEX MICROSCOPIC   ____________________________________________  EKG  ED ECG REPORT I, Hinda Kehr, the attending physician, personally viewed and interpreted this ECG.  Date: 02/23/2018 EKG Time: 3:49 AM Rate: 92 Rhythm: normal sinus rhythm QRS Axis: normal Intervals: normal ST/T Wave abnormalities: Non-specific ST segment / T-wave changes, but no clear evidence of acute ischemia. Narrative Interpretation: no definitive evidence of acute ischemia; does not meet STEMI  criteria.   ____________________________________________  RADIOLOGY   ED MD interpretation: No indication for imaging  Official radiology report(s): No results found.  ____________________________________________   PROCEDURES  Critical Care performed: No   Procedure(s) performed:   Procedures   ____________________________________________   INITIAL IMPRESSION / ASSESSMENT AND PLAN / ED COURSE  As part of my medical decision making, I reviewed the following data within the [place] History obtained from family, Nursing notes reviewed and incorporated, Labs reviewed , EKG interpreted , Old chart reviewed, Radiograph reviewed  and Notes from prior ED visits    Differential diagnosis includes, but is not limited to, failure to thrive, worsening CLL, electrolyte or metabolic abnormality, acute infection.  I evaluated the patient broadly and her labs are stable from prior with a leukopenia which is stable from before, comprehensive metabolic panel that is essentially normal other than hypokalemia and hypoalbuminemia.  Troponin is negative, lactic  acid is normal.  Urine specimen was not provided.  I provided reassurance to the patient and her sister.  They are comfortable with the plan to follow up with Dr. Jacinto Reap.  I sent a message via [facility] to Dr. Rogue Bussing to let him know about the visit.  I gave my usual customary return precautions.     ____________________________________________  FINAL CLINICAL IMPRESSION(S) / ED DIAGNOSES  Final diagnoses:  Generalized weakness     MEDICATIONS GIVEN DURING THIS VISIT:  Medications - No data to display   ED Discharge Orders    None       Note:  This document was prepared using Dragon voice recognition software and may include unintentional dictation errors.   Hinda Kehr, MD 02/23/18 [phone]

## 2018-02-23 NOTE — Discharge Instructions (Signed)
As we discussed, in spite of your leukemia, your work-up today was generally reassuring.  You have no evidence of any acute abnormalities that require you to stay in the hospital.  We encourage you to follow-up with Dr. Rogue Bussing at the next available opportunity to discuss additional options that may be available to you including the possibility of additional home health or assistance at home.    Return to the emergency department if you develop new or worsening symptoms that concern you.

## 2018-02-23 NOTE — ED Notes (Signed)
Family at bedside. 

## 2018-02-23 NOTE — Telephone Encounter (Signed)
Dr. Rogue Bussing, I spoke with the patient regarding her concerns of mobility. She has a walker and a shower chair. However, she would like a home health aide to assist with bathing and ADLs.

## 2018-02-24 NOTE — Telephone Encounter (Signed)
Spoke with Dr. Rogue Bussing. Orders written for [facility]. Pending md - addendum to notes to include home health referral

## 2018-02-28 ENCOUNTER — Encounter (HOSPITAL_COMMUNITY): Payer: Self-pay | Admitting: Internal Medicine

## 2018-03-01 NOTE — Telephone Encounter (Signed)
Home health orders signed and faxed to [facility]

## 2018-03-04 ENCOUNTER — Inpatient Hospital Stay: Payer: Medicare Other | Attending: Internal Medicine | Admitting: Internal Medicine

## 2018-03-04 ENCOUNTER — Telehealth: Payer: Self-pay | Admitting: Pharmacist

## 2018-03-04 ENCOUNTER — Inpatient Hospital Stay: Payer: Medicare Other

## 2018-03-04 ENCOUNTER — Telehealth: Payer: Self-pay | Admitting: Internal Medicine

## 2018-03-04 ENCOUNTER — Telehealth: Payer: Self-pay | Admitting: Pharmacy Technician

## 2018-03-04 ENCOUNTER — Other Ambulatory Visit: Payer: Self-pay

## 2018-03-04 VITALS — BP 124/67 | HR 94 | Temp 98.3°F | Resp 15

## 2018-03-04 DIAGNOSIS — D6959 Other secondary thrombocytopenia: Secondary | ICD-10-CM | POA: Diagnosis not present

## 2018-03-04 DIAGNOSIS — R269 Unspecified abnormalities of gait and mobility: Secondary | ICD-10-CM

## 2018-03-04 DIAGNOSIS — D701 Agranulocytosis secondary to cancer chemotherapy: Secondary | ICD-10-CM | POA: Diagnosis not present

## 2018-03-04 DIAGNOSIS — R0609 Other forms of dyspnea: Secondary | ICD-10-CM

## 2018-03-04 DIAGNOSIS — C9110 Chronic lymphocytic leukemia of B-cell type not having achieved remission: Secondary | ICD-10-CM

## 2018-03-04 DIAGNOSIS — R634 Abnormal weight loss: Secondary | ICD-10-CM

## 2018-03-04 DIAGNOSIS — R5383 Other fatigue: Secondary | ICD-10-CM

## 2018-03-04 LAB — CBC WITH DIFFERENTIAL/PLATELET
ABS IMMATURE GRANULOCYTES: 0.07 10*3/uL (ref 0.00–0.07)
Basophils Absolute: 0 10*3/uL (ref 0.0–0.1)
Basophils Relative: 1 %
Eosinophils Absolute: 0 10*3/uL (ref 0.0–0.5)
Eosinophils Relative: 1 %
HCT: 35.7 % — ABNORMAL LOW (ref 36.0–46.0)
Hemoglobin: 11.5 g/dL — ABNORMAL LOW (ref 12.0–15.0)
Immature Granulocytes: 2 %
LYMPHS ABS: 1.9 10*3/uL (ref 0.7–4.0)
LYMPHS PCT: 51 %
MCH: 27.9 pg (ref 26.0–34.0)
MCHC: 32.2 g/dL (ref 30.0–36.0)
MCV: 86.7 fL (ref 80.0–100.0)
Monocytes Absolute: 0.5 10*3/uL (ref 0.1–1.0)
Monocytes Relative: 14 %
Neutro Abs: 1.1 10*3/uL — ABNORMAL LOW (ref 1.7–7.7)
Neutrophils Relative %: 31 %
Platelets: 100 10*3/uL — ABNORMAL LOW (ref 150–400)
RBC: 4.12 MIL/uL (ref 3.87–5.11)
RDW: 14 % (ref 11.5–15.5)
Smear Review: DECREASED
WBC: 3.6 10*3/uL — AB (ref 4.0–10.5)
nRBC: 0 % (ref 0.0–0.2)

## 2018-03-04 LAB — BASIC METABOLIC PANEL
Anion gap: 7 (ref 5–15)
BUN: 17 mg/dL (ref 8–23)
CHLORIDE: 103 mmol/L (ref 98–111)
CO2: 26 mmol/L (ref 22–32)
Calcium: 10.2 mg/dL (ref 8.9–10.3)
Creatinine, Ser: 0.85 mg/dL (ref 0.44–1.00)
GFR calc Af Amer: >60 mL/min (ref >60)
GFR calc non Af Amer: >60 mL/min (ref >60)
Glucose, Bld: 122 mg/dL — ABNORMAL HIGH (ref 70–99)
Potassium: 4 mmol/L (ref 3.5–5.1)
Sodium: 136 mmol/L (ref 135–145)

## 2018-03-04 LAB — LACTATE DEHYDROGENASE: LDH: 318 U/L — ABNORMAL HIGH (ref 98–192)

## 2018-03-04 MED ORDER — VENETOCLAX 10 & 50 & 100 MG PO TBPK: ORAL_TABLET | ORAL | 0 refills | Status: DC

## 2018-03-04 MED ORDER — ALLOPURINOL 300 MG PO TABS: 300.0000 mg | ORAL_TABLET | Freq: Two times a day (BID) | ORAL | 0 refills | Status: AC

## 2018-03-04 MED ORDER — PREDNISONE 20 MG PO TABS: 20.0000 mg | ORAL_TABLET | Freq: Every day | ORAL | 0 refills | Status: DC

## 2018-03-04 NOTE — Telephone Encounter (Signed)
Oral Chemotherapy Pharmacist Encounter  Patient Education I spoke with patient for overview of new oral chemotherapy medication: Venclexta (venetoclax) for the treatment of CLL in combination with rituximab, planned duration until disease progression or unacceptable drug toxicity. Patient has had disease progression while on ibrutinib and is now transitioning to venetoclax and rituximab.  Counseled patient on administration, dosing, side effects, monitoring, drug-food interactions, safe handling, storage, and disposal. Patient will start her treatment using the venetoclax starter kit. She will take 0 mg orally once daily during week 1; 50 mg once daily during week 2; 100 mg once daily during week 3; 200 mg once daily during week 4, then 400 mg once daily during week 5. She will continue on '400mg'$  daily following the ramp-up. This schedule is subject to change based on patient experience during the ramp-up.  Side effects include but not limited to: TLS, diarrhea, fatigue, N/V.    Reviewed with patient importance of keeping a medication schedule and plan for any missed doses.  Ms. Joynt voiced understanding and appreciation. All questions answered. Medication handout provided and consent obtained.  Provided patient with [facility] phone number. Patient knows to call the office with questions or concerns. Oral Chemotherapy [facility] will continue to follow.  Darl Pikes, PharmD, BCPS, [facility] Hematology/Oncology Clinical Pharmacist [facility] [phone]  03/04/2018 3:21 PM

## 2018-03-04 NOTE — Progress Notes (Signed)
[place] OFFICE PROGRESS NOTE  Patient Care Team: Albina Billet, MD as PCP - General (Internal Medicine)  Cancer Staging No matching staging information was found for the patient.   Oncology History   1. Ultrasound revealed abdominal lymphadenopathy(March, 2013) 2. Biopsy as well as peripheral blood be suggestive of chronic lymphocytic leukemia (April, 2013) 3. Started on bendamustin and Rituxan from May of 2013 4. Has finished her 6 cycles of chemotherapy with Rituxan and bendamustine (October 26, 2011) 5.recurrent and progressive disease with progressive anemia.  Patient was started on IBRUTANIB (March, 2016)  taking 140 mg 3 pills a day  6. Patient was taken off IBRUTANIB in August of 2016 because of persistent myelosuppression even with reducing dose 7. Because of rising WBC count and anemia patient would be started back on a lower dose of improved on ibrutanib 140 mg 2 tablets a day -----------------------------------------------------   DIAGNOSIS: '[ ]'$  CLL  STAGE: 4 ; GOALS: Control  CURRENT/MOST RECENT THERAPY [2016] ibrutinib      Chronic lymphocytic leukemia ([place])   06/06/2014 Initial Diagnosis    Chronic lymphocytic leukemia     INTERVAL HISTORY:  Heather [name] 85 y.o.  female pleasant patient above history of CLL on ibrutinib is here for follow-up/review the results of the bone marrow biopsy.  Patient continues to feel poorly.  Extreme fatigue.  Shortness of breath on exertion.  Difficulty in get back moving around.  Patient currently has home meds at home.  Possible weight loss.  Poor appetite.  Denies any pain.  Review of Systems  Constitutional: Positive for malaise/fatigue and weight loss. Negative for chills, diaphoresis and fever.  HENT: Negative for nosebleeds and sore throat.   Eyes: Negative for double vision.  Respiratory: Positive for shortness of breath (Shortness of breath only with exertion.). Negative for cough, hemoptysis,  sputum production and wheezing.   Cardiovascular: Negative for chest pain, palpitations, orthopnea and leg swelling.  Gastrointestinal: Negative for abdominal pain, blood in stool, constipation, diarrhea, heartburn, melena, nausea and vomiting.  Genitourinary: Negative for dysuria, frequency and urgency.  Musculoskeletal: Negative for back pain and joint pain.  Skin: Negative.  Negative for itching and rash.  Neurological: Negative for dizziness, tingling, focal weakness, weakness and headaches.  Endo/Heme/Allergies: Does not bruise/bleed easily.  Psychiatric/Behavioral: Negative for depression. The patient is not nervous/anxious and does not have insomnia.       PAST MEDICAL HISTORY :  Past Medical History:  Diagnosis Date  . Anemia   . Cataracts, bilateral   . Chronic lymphocytic leukemia ([place]) 06/06/2014  . Hearing loss   . History of chemotherapy   . Hyperlipidemia   . Hypertension   . Hypothyroidism   . Palpitations   . Risk for falls   . Thrombocytopenia ([place])     PAST SURGICAL HISTORY :   Past Surgical History:  Procedure Laterality Date  . CATARACT EXTRACTION Left 03/29/2012  . Excision left cervical node biopsy  05/04/2011    FAMILY HISTORY :   Family History  Problem Relation Age of Onset  . Heart disease Mother   . Stomach cancer Father   . Diabetes Sister     SOCIAL HISTORY:   Social History   Tobacco Use  . Smoking status: Never Smoker  . Smokeless tobacco: Never Used  Substance Use Topics  . Alcohol use: No    Alcohol/week: 0.0 standard drinks  . Drug use: No    ALLERGIES:  has No Known Allergies.  MEDICATIONS:  Current Outpatient Medications  Medication Sig Dispense Refill  . hydrALAZINE (APRESOLINE) 25 MG tablet TAKE 1 TABLET (25 MG TOTAL) BY MOUTH 2 (TWO) TIMES DAILY. 180 tablet 3  . IMBRUVICA 280 MG TABS TAKE 1 TABLET BY MOUTH DAILY. 28 tablet 6  . levothyroxine (SYNTHROID, LEVOTHROID) 75 MCG tablet Take 75 mcg by mouth daily before  breakfast. Only takes Saturday- Thursday. Hold synthroid on Friday    . allopurinol (ZYLOPRIM) 300 MG tablet Take 1 tablet (300 mg total) by mouth 2 (two) times daily. 120 tablet 0  . losartan (COZAAR) 100 MG tablet Take 100 mg by mouth daily.    . predniSONE (DELTASONE) 20 MG tablet Take 1 tablet (20 mg total) by mouth daily with breakfast. Once a day with food x 10 days. 10 tablet 0  . venetoclax (VENCLEXTA STARTING PACK) 10 & 50 & 100 MG TBPK Take '20mg'$  by mouth daily for week one, '50mg'$  daily for week 2, '100mg'$  daily for week 3, '200mg'$  daily for week 4. Take as directed. 42 each 0   No current facility-administered medications for this visit.     PHYSICAL EXAMINATION: ECOG PERFORMANCE STATUS: 2 - Symptomatic, <50% confined to bed  BP 124/67 (BP Location: Left Arm, Patient Position: Sitting, Cuff Size: Normal)   Pulse 94   Temp 98.3 F (36.8 C) (Tympanic)   Resp 15   There were no vitals filed for this visit.  Physical Exam  Constitutional: She is oriented to person, place, and time and well-developed, well-nourished, and in no distress.  Elderly frail female patient she is walking with a walker.  Accompanied by her sister.  HENT:  Head: Normocephalic and atraumatic.  Mouth/Throat: Oropharynx is clear and moist. No oropharyngeal exudate.  Eyes: Pupils are equal, round, and reactive to light.  Neck: Normal range of motion. Neck supple.  Cardiovascular: Normal rate and regular rhythm.  Pulmonary/Chest: No respiratory distress. She has no wheezes.  Abdominal: Soft. Bowel sounds are normal. She exhibits no distension and no mass. There is no abdominal tenderness. There is no rebound and no guarding.  Musculoskeletal: Normal range of motion.        General: No tenderness or edema.     Comments: Mild bilateral leg swelling.  Neurological: She is alert and oriented to person, place, and time.  Skin: Skin is warm.  Psychiatric: Affect normal.      LABORATORY DATA:  I have reviewed  the data as listed    Component Value Date/Time   NA 136 03/04/2018 1322   NA 133 (L) 05/08/2014 1349   K 4.0 03/04/2018 1322   K 3.6 05/08/2014 1349   CL 103 03/04/2018 1322   CL 102 05/08/2014 1349   CO2 26 03/04/2018 1322   CO2 26 05/08/2014 1349   GLUCOSE 122 (H) 03/04/2018 1322   GLUCOSE 82 05/08/2014 1349   BUN 17 03/04/2018 1322   BUN 18 05/08/2014 1349   CREATININE 0.85 03/04/2018 1322   CREATININE 1.13 (H) 05/08/2014 1349   CALCIUM 10.2 03/04/2018 1322   CALCIUM 9.1 05/08/2014 1349   PROT 5.2 (L) 02/23/2018 0355   PROT 6.7 05/08/2014 1349   ALBUMIN 3.0 (L) 02/23/2018 0355   ALBUMIN 4.2 05/08/2014 1349   AST 37 02/23/2018 0355   AST 24 05/08/2014 1349   ALT 17 02/23/2018 0355   ALT 13 (L) 05/08/2014 1349   ALKPHOS 26 (L) 02/23/2018 0355   ALKPHOS 41 05/08/2014 1349   BILITOT 1.6 (H) 02/23/2018 0355   BILITOT  1.3 (H) 05/08/2014 1349   GFRNONAA >60 03/04/2018 1322   GFRNONAA 45 (L) 05/08/2014 1349   GFRAA >60 03/04/2018 1322   GFRAA 52 (L) 05/08/2014 1349    No results found for: SPEP, UPEP  Lab Results  Component Value Date   WBC 3.6 (L) 03/04/2018   NEUTROABS 1.1 (L) 03/04/2018   HGB 11.5 (L) 03/04/2018   HCT 35.7 (L) 03/04/2018   MCV 86.7 03/04/2018   PLT 100 (L) 03/04/2018      Chemistry      Component Value Date/Time   NA 136 03/04/2018 1322   NA 133 (L) 05/08/2014 1349   K 4.0 03/04/2018 1322   K 3.6 05/08/2014 1349   CL 103 03/04/2018 1322   CL 102 05/08/2014 1349   CO2 26 03/04/2018 1322   CO2 26 05/08/2014 1349   BUN 17 03/04/2018 1322   BUN 18 05/08/2014 1349   CREATININE 0.85 03/04/2018 1322   CREATININE 1.13 (H) 05/08/2014 1349      Component Value Date/Time   CALCIUM 10.2 03/04/2018 1322   CALCIUM 9.1 05/08/2014 1349   ALKPHOS 26 (L) 02/23/2018 0355   ALKPHOS 41 05/08/2014 1349   AST 37 02/23/2018 0355   AST 24 05/08/2014 1349   ALT 17 02/23/2018 0355   ALT 13 (L) 05/08/2014 1349   BILITOT 1.6 (H) 02/23/2018 0355   BILITOT  1.3 (H) 05/08/2014 1349       RADIOGRAPHIC STUDIES: I have personally reviewed the radiological images as listed and agreed with the findings in the report. No results found.   ASSESSMENT & PLAN:  Chronic lymphocytic leukemia ([place]) # CLL-most recently on Ibrutinib. PET scan OCT 2019- moderate splenomegaly; slightly increased in size [compared to CT scan July 2019]; January 2020 MRI-approximately 11 cm splenic lesion/chronic but slowly growing; also about inch in size splenic lesion-new.   #Unfortunately/as expectedly- bone marrow biopsy shows involvement by CLL.  Karyotype normal.  Check FISH panel.  #Reviewed the findings with the patient sister in detail.  Given her continued fatigue/poor appetite-concern for progression of CLL.  I would recommend next therapy with venetoclax.  Start patient on tumor lysis precautions/start allopurinol.  #Discussed the mechanism of action potential side effects including but not limited to tumor lysis syndrome which sometimes could be fatal.  Given her age borderline performance status recommend starting inpatient.  Patient agreeable.  #Mild leukopenia/mild thrombocytopenia-normal hemoglobin-secondary to CLL/no evidence of MDS.  #Weight loss of 20 pounds over the last 3 to 4 months.  Worsening; recommend prednisone 20 mg a day.  #Debility-gait instability secondary to progressive disease continue home health.  #Also discussed with Dr. Hall Busing patient's PCP.  # DISPOSITION:  # TBD.   Addendum: Discussed with Ebony Hail from pharmacy.  Venetoclax should be available on 2/17.  We will plan to start treatment/ in pt on 2/18.   # 40 minutes face-to-face with the patient discussing the above plan of care; more than 50% of time spent on prognosis/ natural history; counseling and coordination.   CC: Dr.Tate.    No orders of the defined types were placed in this encounter.  All questions were answered. The patient knows to call the clinic with any  problems, questions or concerns.      Cammie Sickle, MD 03/04/2018 4:04 PM

## 2018-03-04 NOTE — Assessment & Plan Note (Addendum)
#  CLL-most recently on Ibrutinib. PET scan OCT 2019- moderate splenomegaly; slightly increased in size [compared to CT scan July 2019]; January 2020 MRI-approximately 11 cm splenic lesion/chronic but slowly growing; also about inch in size splenic lesion-new.   #Unfortunately/as expectedly- bone marrow biopsy shows involvement by CLL.  Karyotype normal.  Check FISH panel.  #Reviewed the findings with the patient sister in detail.  Given her continued fatigue/poor appetite-concern for progression of CLL.  I would recommend next therapy with venetoclax.  Start patient on tumor lysis precautions/start allopurinol.  #Discussed the mechanism of action potential side effects including but not limited to tumor lysis syndrome which sometimes could be fatal.  Given her age borderline performance status recommend starting inpatient.  Patient agreeable.  #Mild leukopenia/mild thrombocytopenia-normal hemoglobin-secondary to CLL/no evidence of MDS.  #Weight loss of 20 pounds over the last 3 to 4 months.  Worsening; recommend prednisone 20 mg a day.  #Debility-gait instability secondary to progressive disease continue home health.  #Also discussed with Dr. Hall Busing patient's PCP.  # DISPOSITION:  # TBD.   Addendum: Discussed with Ebony Hail from pharmacy.  Venetoclax should be available on 2/17.  We will plan to start treatment/ in pt on 2/18.   # 40 minutes face-to-face with the patient discussing the above plan of care; more than 50% of time spent on prognosis/ natural history; counseling and coordination.   CC: Dr.Tate.

## 2018-03-04 NOTE — Telephone Encounter (Signed)
Heather [name]-as per [name]-patient should have her venatoclax pills on 2/17.   #Please inform that I sent the prescription of prednisone/allopurinol to her pharmacy.  Start them today.  # We will plan to admit the patient on 2/18-Tuesday to the hospital directly.  Please inform/confirm with the patient/sister.  #Patient will need bed on 1C/telemetry.  Thanks GB

## 2018-03-04 NOTE — Telephone Encounter (Signed)
Oral Oncology Patient Advocate Encounter  Received notification from [employer] that prior authorization for Venclexta is required.  PA submitted on CoverMyMeds Key [id number] Status is pending  Oral Oncology Clinic will continue to follow.  [place] Patient [place] Phone [phone] Fax [phone] 03/04/2018 3:32 PM

## 2018-03-04 NOTE — Telephone Encounter (Signed)
Oral Oncology Pharmacist Encounter  Received new prescription for venetoclax (Venclexta) for the treatment of CLL in conjunction with rituximab, planned duration until disease progression or unacceptable toxicity. Patient with disease progression despite ibrutinib therapy.   Labs from 03/04/2018 assessed, ok for treatment. Due to patient overall status will be admitted inpatient (plan for Tuesday 2/18 or Wednesday 2/19) for close monitoring and adequate hydration due to concern for TLS.  Prescription dose and frequency assessed, agree with ramp up dosing per venetoclax package insert with rituximab starting on week 5.   Would recommend to start the patient on allopurinol 300mg  PO daily and to increase oral hydration before initiation of venetoclax.  Will provide a calendar for recommended TLS lab monitoring and IV hydration while initiating venetoclax ramp-up. Patient will be monitored closely for clinical and/ or laboratory TLS.    Current medication list in Epic reviewed, no DDIs with venetoclax identified.  Prescription has been e-scribed to the [facility] for benefits analysis and approval.  Oral Oncology Clinic will continue to follow for insurance authorization, copayment issues, initial counseling and start date.  Jalene Mullet, PharmD PGY2 Hematology/ Oncology Pharmacy Resident [facility] [phone]  03/04/2018 3:42 PM

## 2018-03-04 NOTE — Telephone Encounter (Signed)
Oral Oncology Patient Advocate Encounter  Prior Authorization for Heather [name] has been approved.    PA# [id number] Effective dates: 03/04/2018 through 01/19/2019  Patients co-pay is $3.90  Oral Oncology Clinic will continue to follow.   [place] Patient [place] Phone [phone] Fax [phone] 03/04/2018 3:33 PM

## 2018-03-07 ENCOUNTER — Other Ambulatory Visit: Payer: Self-pay | Admitting: *Deleted

## 2018-03-07 ENCOUNTER — Telehealth: Payer: Self-pay | Admitting: *Deleted

## 2018-03-07 DIAGNOSIS — C9110 Chronic lymphocytic leukemia of B-cell type not having achieved remission: Secondary | ICD-10-CM

## 2018-03-07 MED FILL — VENCLEXTA STARTING PACK: 10 & 50 & 1 | 28 days supply | Qty: 42 | Fill #0

## 2018-03-07 NOTE — Telephone Encounter (Signed)
Asking when patient is to start the Allopurinol. Please advise

## 2018-03-07 NOTE — Telephone Encounter (Signed)
Scheduled medication to be filled at [facility] on Monday 2/17.  Heather [name] will pick up medication and bring to clinic.

## 2018-03-07 NOTE — Telephone Encounter (Addendum)
At 10 am, I contacted the patient's sister [name] this am with these medication instructions and discussed why the premedications were to be taken (to prevent tumor lysis syndrome). Per sister [name], pt has already started the prednisone on Friday but didn't know about the start time for the allopurinol. I confirmed that she has both of these medications at the patient's home.  I explained to her that the patient will be admitted to 1 C for monitoring of the oral chemotherapy. [name] was made aware that the patient would have labs drawn about every 4 hours to ensure the safety of the patient as well as have IV fluids administered. I instructed the patient's sister, [name], that I would call her in the am with the bed assignment and when to report to admit/reg. Desk. She gave verbal understanding that when the bed is ready, she would report to the medical mall for registration process and would subsequently go to the unit. Teach back process performed.    Given that the patient's other sister, Izora Gala has called. I have returned Nancy's phone call and also reiterated the above plan of care. Teach back process performed. Patient is to start the allopurinol/prednisone today.

## 2018-03-08 ENCOUNTER — Other Ambulatory Visit: Payer: Self-pay

## 2018-03-08 ENCOUNTER — Inpatient Hospital Stay: Payer: Medicare Other

## 2018-03-08 ENCOUNTER — Inpatient Hospital Stay
Admission: AD | Admit: 2018-03-08 | Discharge: 2018-03-10 | DRG: 847 | Disposition: A | Discharge status: Home Health | Payer: Medicare Other | Source: Ambulatory Visit | Attending: Internal Medicine | Admitting: Internal Medicine

## 2018-03-08 ENCOUNTER — Inpatient Hospital Stay (HOSPITAL_BASED_OUTPATIENT_CLINIC_OR_DEPARTMENT_OTHER): Payer: Medicare Other | Admitting: Internal Medicine

## 2018-03-08 ENCOUNTER — Encounter: Payer: Self-pay | Admitting: Internal Medicine

## 2018-03-08 ENCOUNTER — Telehealth: Payer: Self-pay | Admitting: Pharmacist

## 2018-03-08 VITALS — BP 123/65 | HR 96 | Temp 98.2°F | Resp 22 | Ht 60.0 in | Wt 135.0 lb

## 2018-03-08 DIAGNOSIS — Z8249 Family history of ischemic heart disease and other diseases of the circulatory system: Secondary | ICD-10-CM

## 2018-03-08 DIAGNOSIS — D696 Thrombocytopenia, unspecified: Secondary | ICD-10-CM | POA: Diagnosis not present

## 2018-03-08 DIAGNOSIS — R634 Abnormal weight loss: Secondary | ICD-10-CM

## 2018-03-08 DIAGNOSIS — E785 Hyperlipidemia, unspecified: Secondary | ICD-10-CM | POA: Diagnosis present

## 2018-03-08 DIAGNOSIS — R2689 Other abnormalities of gait and mobility: Secondary | ICD-10-CM | POA: Diagnosis present

## 2018-03-08 DIAGNOSIS — Z833 Family history of diabetes mellitus: Secondary | ICD-10-CM | POA: Diagnosis not present

## 2018-03-08 DIAGNOSIS — I472 Ventricular tachycardia: Secondary | ICD-10-CM | POA: Diagnosis present

## 2018-03-08 DIAGNOSIS — Z66 Do not resuscitate: Secondary | ICD-10-CM | POA: Diagnosis present

## 2018-03-08 DIAGNOSIS — R630 Anorexia: Secondary | ICD-10-CM

## 2018-03-08 DIAGNOSIS — R269 Unspecified abnormalities of gait and mobility: Secondary | ICD-10-CM

## 2018-03-08 DIAGNOSIS — Z5111 Encounter for antineoplastic chemotherapy: Secondary | ICD-10-CM | POA: Diagnosis present

## 2018-03-08 DIAGNOSIS — E039 Hypothyroidism, unspecified: Secondary | ICD-10-CM | POA: Diagnosis present

## 2018-03-08 DIAGNOSIS — C9110 Chronic lymphocytic leukemia of B-cell type not having achieved remission: Secondary | ICD-10-CM | POA: Diagnosis present

## 2018-03-08 DIAGNOSIS — R0609 Other forms of dyspnea: Secondary | ICD-10-CM

## 2018-03-08 DIAGNOSIS — Z79899 Other long term (current) drug therapy: Secondary | ICD-10-CM | POA: Diagnosis not present

## 2018-03-08 DIAGNOSIS — R5381 Other malaise: Secondary | ICD-10-CM | POA: Diagnosis present

## 2018-03-08 DIAGNOSIS — I340 Nonrheumatic mitral (valve) insufficiency: Secondary | ICD-10-CM | POA: Diagnosis not present

## 2018-03-08 DIAGNOSIS — Z9181 History of falling: Secondary | ICD-10-CM

## 2018-03-08 DIAGNOSIS — D6959 Other secondary thrombocytopenia: Secondary | ICD-10-CM

## 2018-03-08 DIAGNOSIS — Z800 Family history of malignant neoplasm of digestive organs: Secondary | ICD-10-CM | POA: Diagnosis not present

## 2018-03-08 DIAGNOSIS — Z888 Allergy status to other drugs, medicaments and biological substances status: Secondary | ICD-10-CM

## 2018-03-08 DIAGNOSIS — Z9221 Personal history of antineoplastic chemotherapy: Secondary | ICD-10-CM | POA: Diagnosis not present

## 2018-03-08 DIAGNOSIS — R5383 Other fatigue: Secondary | ICD-10-CM

## 2018-03-08 DIAGNOSIS — I361 Nonrheumatic tricuspid (valve) insufficiency: Secondary | ICD-10-CM | POA: Diagnosis not present

## 2018-03-08 DIAGNOSIS — D72819 Decreased white blood cell count, unspecified: Secondary | ICD-10-CM | POA: Diagnosis not present

## 2018-03-08 DIAGNOSIS — Z7189 Other specified counseling: Secondary | ICD-10-CM | POA: Insufficient documentation

## 2018-03-08 DIAGNOSIS — Z6826 Body mass index (BMI) 26.0-26.9, adult: Secondary | ICD-10-CM | POA: Diagnosis not present

## 2018-03-08 DIAGNOSIS — I10 Essential (primary) hypertension: Secondary | ICD-10-CM | POA: Diagnosis present

## 2018-03-08 DIAGNOSIS — D630 Anemia in neoplastic disease: Secondary | ICD-10-CM | POA: Diagnosis present

## 2018-03-08 DIAGNOSIS — Z79890 Hormone replacement therapy (postmenopausal): Secondary | ICD-10-CM

## 2018-03-08 DIAGNOSIS — E883 Tumor lysis syndrome: Secondary | ICD-10-CM | POA: Diagnosis present

## 2018-03-08 LAB — LACTATE DEHYDROGENASE
LDH: 339 U/L — AB (ref 98–192)
LDH: 269 U/L — AB (ref 98–192)

## 2018-03-08 LAB — CBC WITH DIFFERENTIAL/PLATELET
Abs Immature Granulocytes: 0.09 10*3/uL — ABNORMAL HIGH (ref 0.00–0.07)
Basophils Absolute: 0 10*3/uL (ref 0.0–0.1)
Basophils Relative: 1 %
EOS ABS: 0 10*3/uL (ref 0.0–0.5)
Eosinophils Relative: 0 %
HCT: 34.8 % — ABNORMAL LOW (ref 36.0–46.0)
Hemoglobin: 11.1 g/dL — ABNORMAL LOW (ref 12.0–15.0)
Immature Granulocytes: 3 %
Lymphocytes Relative: 44 %
Lymphs Abs: 1.7 10*3/uL (ref 0.7–4.0)
MCH: 27.8 pg (ref 26.0–34.0)
MCHC: 31.9 g/dL (ref 30.0–36.0)
MCV: 87.2 fL (ref 80.0–100.0)
Monocytes Absolute: 0.7 10*3/uL (ref 0.1–1.0)
Monocytes Relative: 18 %
NEUTROS ABS: 1.3 10*3/uL — AB (ref 1.7–7.7)
Neutrophils Relative %: 34 %
Platelets: 111 10*3/uL — ABNORMAL LOW (ref 150–400)
RBC: 3.99 MIL/uL (ref 3.87–5.11)
RDW: 14.3 % (ref 11.5–15.5)
WBC: 3.7 10*3/uL — ABNORMAL LOW (ref 4.0–10.5)
nRBC: 0 % (ref 0.0–0.2)

## 2018-03-08 LAB — BASIC METABOLIC PANEL
Anion gap: 6 (ref 5–15)
BUN: 21 mg/dL (ref 8–23)
CALCIUM: 9.1 mg/dL (ref 8.9–10.3)
CO2: 25 mmol/L (ref 22–32)
CREATININE: 0.98 mg/dL (ref 0.44–1.00)
Chloride: 104 mmol/L (ref 98–111)
GFR calc Af Amer: >60 mL/min (ref >60)
GFR calc non Af Amer: 52 mL/min — ABNORMAL LOW (ref >60)
Glucose, Bld: 97 mg/dL (ref 70–99)
Potassium: 3.8 mmol/L (ref 3.5–5.1)
Sodium: 135 mmol/L (ref 135–145)

## 2018-03-08 LAB — URIC ACID
Uric Acid, Serum: 4.1 mg/dL (ref 2.5–7.1)
Uric Acid, Serum: 3.8 mg/dL (ref 2.5–7.1)

## 2018-03-08 LAB — MAGNESIUM
Magnesium: 1.8 mg/dL (ref 1.7–2.4)
Magnesium: 1.7 mg/dL (ref 1.7–2.4)

## 2018-03-08 LAB — COMPREHENSIVE METABOLIC PANEL
ALT: 54 U/L — ABNORMAL HIGH (ref 0–44)
AST: 79 U/L — ABNORMAL HIGH (ref 15–41)
Albumin: 3.6 g/dL (ref 3.5–5.0)
Alkaline Phosphatase: 35 U/L — ABNORMAL LOW (ref 38–126)
Anion gap: 8 (ref 5–15)
BUN: 18 mg/dL (ref 8–23)
CHLORIDE: 103 mmol/L (ref 98–111)
CO2: 26 mmol/L (ref 22–32)
Calcium: 10 mg/dL (ref 8.9–10.3)
Creatinine, Ser: 0.92 mg/dL (ref 0.44–1.00)
GFR calc Af Amer: >60 mL/min (ref >60)
GFR calc non Af Amer: 56 mL/min — ABNORMAL LOW (ref >60)
Glucose, Bld: 107 mg/dL — ABNORMAL HIGH (ref 70–99)
Potassium: 3.9 mmol/L (ref 3.5–5.1)
Sodium: 137 mmol/L (ref 135–145)
Total Bilirubin: 1.3 mg/dL — ABNORMAL HIGH (ref 0.3–1.2)
Total Protein: 6.4 g/dL — ABNORMAL LOW (ref 6.5–8.1)

## 2018-03-08 LAB — T4, FREE: Free T4: 1.04 ng/dL (ref 0.82–1.77)

## 2018-03-08 LAB — PHOSPHORUS
PHOSPHORUS: 3 mg/dL (ref 2.5–4.6)
Phosphorus: 3.2 mg/dL (ref 2.5–4.6)

## 2018-03-08 LAB — TSH: TSH: 8.503 u[IU]/mL — ABNORMAL HIGH (ref 0.350–4.500)

## 2018-03-08 MED ORDER — ACETAMINOPHEN 325 MG PO TABS: 650.0000 mg | ORAL_TABLET | Freq: Four times a day (QID) | ORAL | Status: DC | PRN

## 2018-03-08 MED ORDER — ACETAMINOPHEN 650 MG RE SUPP: 650.0000 mg | Freq: Four times a day (QID) | RECTAL | Status: DC | PRN

## 2018-03-08 MED ORDER — ENOXAPARIN SODIUM 40 MG/0.4ML ~~LOC~~ SOLN
40.0000 mg | Freq: Every day | SUBCUTANEOUS | Status: DC
  Administered 2018-03-08 – 2018-03-09 (×2): 40 mg via SUBCUTANEOUS
  Filled 2018-03-08 (×2): qty 0.4

## 2018-03-08 MED ORDER — ONDANSETRON HCL 4 MG/2ML IJ SOLN: 4.0000 mg | Freq: Four times a day (QID) | INTRAMUSCULAR | Status: DC | PRN

## 2018-03-08 MED ORDER — PREDNISONE 20 MG PO TABS
20.0000 mg | ORAL_TABLET | Freq: Every day | ORAL | Status: DC
  Administered 2018-03-09 – 2018-03-10 (×2): 20 mg via ORAL
  Filled 2018-03-08 (×2): qty 1

## 2018-03-08 MED ORDER — HYDRALAZINE HCL 50 MG PO TABS
25.0000 mg | ORAL_TABLET | Freq: Two times a day (BID) | ORAL | Status: DC
  Administered 2018-03-08 (×2): 25 mg via ORAL
  Filled 2018-03-08 (×2): qty 1

## 2018-03-08 MED ORDER — LEVOTHYROXINE SODIUM 50 MCG PO TABS
75.0000 ug | ORAL_TABLET | Freq: Every day | ORAL | Status: DC
  Administered 2018-03-09 – 2018-03-10 (×2): 75 ug via ORAL
  Filled 2018-03-08 (×3): qty 1

## 2018-03-08 MED ORDER — VENETOCLAX 10 MG PO TABS
20.0000 mg | ORAL_TABLET | Freq: Every day | ORAL | Status: DC
  Administered 2018-03-08 – 2018-03-09 (×2): 20 mg via ORAL
  Filled 2018-03-08 (×2): qty 2

## 2018-03-08 MED ORDER — SODIUM CHLORIDE 0.9 % IV SOLN
INTRAVENOUS | Status: DC
  Administered 2018-03-08 – 2018-03-09 (×4): via INTRAVENOUS

## 2018-03-08 MED ORDER — ALLOPURINOL 100 MG PO TABS
300.0000 mg | ORAL_TABLET | Freq: Two times a day (BID) | ORAL | Status: DC
  Administered 2018-03-08 – 2018-03-10 (×5): 300 mg via ORAL
  Filled 2018-03-08 (×5): qty 3

## 2018-03-08 MED ORDER — ONDANSETRON HCL 4 MG PO TABS: 4.0000 mg | ORAL_TABLET | Freq: Four times a day (QID) | ORAL | Status: DC | PRN

## 2018-03-08 NOTE — Progress Notes (Signed)
   [place] at [place] [facility] Day: 0 days Heather [name] is a 85 y.o. female presenting with No chief complaint on file. .   Advance care planning discussed with patient at bedside.  She is alert and oriented and able to participate in the conversation. All questions in regards to overall condition and expected prognosis answered.   Patient has mentioned that she would want to continue treatment for now.  But in the event of cardiac arrest, she would not want to be resuscitated.  She expressed a desire for natural death.  The decision was made to change current code status to DNR  CODE STATUS: DNR Time spent: 18 minutes

## 2018-03-08 NOTE — Telephone Encounter (Signed)
Oral Chemotherapy Pharmacist Encounter   Week one of venetoclax hand delivered to the unit for Ms. Pezzullo. Spoke with RN that will be caring for Ms. Munns about the medication and left the medication with the floor pharmacist Sheema.   Darl Pikes, PharmD, BCPS, [facility] Hematology/Oncology Clinical Pharmacist [facility] [place] [facility] [phone]  03/08/2018 11:31 AM

## 2018-03-08 NOTE — Progress Notes (Signed)
[place] OFFICE PROGRESS NOTE  Patient Care Team: Albina Billet, MD as PCP - General (Internal Medicine)  Cancer Staging No matching staging information was found for the patient.   Oncology History   1. Ultrasound revealed abdominal lymphadenopathy(March, 2013) 2. Biopsy as well as peripheral blood be suggestive of chronic lymphocytic leukemia (April, 2013) 3. Started on bendamustin and Rituxan from May of 2013 4. Has finished her 6 cycles of chemotherapy with Rituxan and bendamustine (October 26, 2011) 5.recurrent and progressive disease with progressive anemia.  Patient was started on IBRUTANIB (March, 2016)  taking 140 mg 3 pills a day  6. Patient was taken off IBRUTANIB in August of 2016 because of persistent myelosuppression even with reducing dose 7. Because of rising WBC count and anemia patient would be started back on a lower dose of improved on ibrutanib 140 mg 2 tablets a day -----------------------------------------------------   DIAGNOSIS: '[ ]'$  CLL  STAGE: 4 ; GOALS: Control  CURRENT/MOST RECENT THERAPY [2016] ibrutinib      Chronic lymphocytic leukemia ([place])   06/06/2014 Initial Diagnosis    Chronic lymphocytic leukemia     INTERVAL HISTORY:  Heather [name] 85 y.o.  female pleasant patient above history of CLL most recently progressed on ibrutinib.  She is here to proceed to be admitted the hospital for inpatient administration of venetoclax.   Patient continues to feel poorly.  Complains of extreme fatigue.  Shortness of breath on exertion.  Patient has difficulty getting up and moving around at home.  Poor appetite.  No nausea vomiting.  Weight loss.  Denies any pain.  Review of Systems  Constitutional: Positive for malaise/fatigue and weight loss. Negative for chills, diaphoresis and fever.  HENT: Negative for nosebleeds and sore throat.   Eyes: Negative for double vision.  Respiratory: Positive for shortness of breath (Shortness of  breath only with exertion.). Negative for cough, hemoptysis, sputum production and wheezing.   Cardiovascular: Negative for chest pain, palpitations, orthopnea and leg swelling.  Gastrointestinal: Negative for abdominal pain, blood in stool, constipation, diarrhea, heartburn, melena, nausea and vomiting.  Genitourinary: Negative for dysuria, frequency and urgency.  Musculoskeletal: Negative for back pain and joint pain.  Skin: Negative.  Negative for itching and rash.  Neurological: Negative for dizziness, tingling, focal weakness, weakness and headaches.  Endo/Heme/Allergies: Does not bruise/bleed easily.  Psychiatric/Behavioral: Negative for depression. The patient is not nervous/anxious and does not have insomnia.       PAST MEDICAL HISTORY :  Past Medical History:  Diagnosis Date  . Anemia   . Cataracts, bilateral   . Chronic lymphocytic leukemia ([place]) 06/06/2014  . Hearing loss   . History of chemotherapy   . Hyperlipidemia   . Hypertension   . Hypothyroidism   . Palpitations   . Risk for falls   . Thrombocytopenia ([place])     PAST SURGICAL HISTORY :   Past Surgical History:  Procedure Laterality Date  . CATARACT EXTRACTION Left 03/29/2012  . Excision left cervical node biopsy  05/04/2011    FAMILY HISTORY :   Family History  Problem Relation Age of Onset  . Heart disease Mother   . Stomach cancer Father   . Diabetes Sister     SOCIAL HISTORY:   Social History   Tobacco Use  . Smoking status: Never Smoker  . Smokeless tobacco: Never Used  Substance Use Topics  . Alcohol use: No    Alcohol/week: 0.0 standard drinks  . Drug use: No  ALLERGIES:  is allergic to losartan.  MEDICATIONS:  Current Outpatient Medications  Medication Sig Dispense Refill  . allopurinol (ZYLOPRIM) 300 MG tablet Take 1 tablet (300 mg total) by mouth 2 (two) times daily. 120 tablet 0  . hydrALAZINE (APRESOLINE) 25 MG tablet TAKE 1 TABLET (25 MG TOTAL) BY MOUTH 2 (TWO) TIMES DAILY.  180 tablet 3  . levothyroxine (SYNTHROID, LEVOTHROID) 75 MCG tablet Take 75 mcg by mouth daily before breakfast. Only takes Saturday- Thursday. Hold synthroid on Friday    . predniSONE (DELTASONE) 20 MG tablet Take 1 tablet (20 mg total) by mouth daily with breakfast. Once a day with food x 10 days. 10 tablet 0  . losartan (COZAAR) 100 MG tablet Take 100 mg by mouth daily.    Marland Kitchen venetoclax (VENCLEXTA STARTING PACK) 10 & 50 & 100 MG TBPK Take '20mg'$  by mouth daily for week one, '50mg'$  daily for week 2, '100mg'$  daily for week 3, '200mg'$  daily for week 4. Take as directed. (Patient not taking: Reported on 03/08/2018) 42 each 0   No current facility-administered medications for this visit.     PHYSICAL EXAMINATION: ECOG PERFORMANCE STATUS: 2 - Symptomatic, <50% confined to bed  BP 123/65 (BP Location: Left Arm, Patient Position: Sitting)   Pulse 96   Temp 98.2 F (36.8 C) (Oral)   Resp (!) 22   Ht 5' (1.524 m)   Wt 135 lb (61.2 kg)   BMI 26.37 kg/m   Filed Weights   03/08/18 0919  Weight: 135 lb (61.2 kg)    Physical Exam  Constitutional: She is oriented to person, place, and time and well-developed, well-nourished, and in no distress.  Elderly frail female patient she is walking with a walker.  Accompanied by her sister.  HENT:  Head: Normocephalic and atraumatic.  Mouth/Throat: Oropharynx is clear and moist. No oropharyngeal exudate.  Eyes: Pupils are equal, round, and reactive to light.  Neck: Normal range of motion. Neck supple.  Cardiovascular: Normal rate and regular rhythm.  Pulmonary/Chest: No respiratory distress. She has no wheezes.  Abdominal: Soft. Bowel sounds are normal. She exhibits no distension and no mass. There is no abdominal tenderness. There is no rebound and no guarding.  Musculoskeletal: Normal range of motion.        General: No tenderness or edema.     Comments: Mild bilateral leg swelling.  Neurological: She is alert and oriented to person, place, and time.   Skin: Skin is warm.  Psychiatric: Affect normal.      LABORATORY DATA:  I have reviewed the data as listed    Component Value Date/Time   NA 137 03/08/2018 0857   NA 133 (L) 05/08/2014 1349   K 3.9 03/08/2018 0857   K 3.6 05/08/2014 1349   CL 103 03/08/2018 0857   CL 102 05/08/2014 1349   CO2 26 03/08/2018 0857   CO2 26 05/08/2014 1349   GLUCOSE 107 (H) 03/08/2018 0857   GLUCOSE 82 05/08/2014 1349   BUN 18 03/08/2018 0857   BUN 18 05/08/2014 1349   CREATININE 0.92 03/08/2018 0857   CREATININE 1.13 (H) 05/08/2014 1349   CALCIUM 10.0 03/08/2018 0857   CALCIUM 9.1 05/08/2014 1349   PROT 6.4 (L) 03/08/2018 0857   PROT 6.7 05/08/2014 1349   ALBUMIN 3.6 03/08/2018 0857   ALBUMIN 4.2 05/08/2014 1349   AST 79 (H) 03/08/2018 0857   AST 24 05/08/2014 1349   ALT 54 (H) 03/08/2018 0857   ALT 13 (L) 05/08/2014 1349  ALKPHOS 35 (L) 03/08/2018 0857   ALKPHOS 41 05/08/2014 1349   BILITOT 1.3 (H) 03/08/2018 0857   BILITOT 1.3 (H) 05/08/2014 1349   GFRNONAA 56 (L) 03/08/2018 0857   GFRNONAA 45 (L) 05/08/2014 1349   GFRAA >60 03/08/2018 0857   GFRAA 52 (L) 05/08/2014 1349    No results found for: SPEP, UPEP  Lab Results  Component Value Date   WBC 3.7 (L) 03/08/2018   NEUTROABS 1.3 (L) 03/08/2018   HGB 11.1 (L) 03/08/2018   HCT 34.8 (L) 03/08/2018   MCV 87.2 03/08/2018   PLT 111 (L) 03/08/2018      Chemistry      Component Value Date/Time   NA 137 03/08/2018 0857   NA 133 (L) 05/08/2014 1349   K 3.9 03/08/2018 0857   K 3.6 05/08/2014 1349   CL 103 03/08/2018 0857   CL 102 05/08/2014 1349   CO2 26 03/08/2018 0857   CO2 26 05/08/2014 1349   BUN 18 03/08/2018 0857   BUN 18 05/08/2014 1349   CREATININE 0.92 03/08/2018 0857   CREATININE 1.13 (H) 05/08/2014 1349      Component Value Date/Time   CALCIUM 10.0 03/08/2018 0857   CALCIUM 9.1 05/08/2014 1349   ALKPHOS 35 (L) 03/08/2018 0857   ALKPHOS 41 05/08/2014 1349   AST 79 (H) 03/08/2018 0857   AST 24 05/08/2014  1349   ALT 54 (H) 03/08/2018 0857   ALT 13 (L) 05/08/2014 1349   BILITOT 1.3 (H) 03/08/2018 0857   BILITOT 1.3 (H) 05/08/2014 1349       RADIOGRAPHIC STUDIES: I have personally reviewed the radiological images as listed and agreed with the findings in the report. No results found.   ASSESSMENT & PLAN:  Chronic lymphocytic leukemia ([place]) # CLL-most recently on Ibrutinib. PET scan OCT 2019- moderate splenomegaly; slightly increased in size [compared to CT scan July 2019]; January 2020 MRI-approximately 11 cm splenic lesion/chronic but slowly growing; also about inch in size splenic lesion-new.   #Bone marrow biopsy shows involvement by CLL.  Karyotype is normal.  FISH panel is pending.  #Given the clinical progression-I would recommend switching the patient to venetoclax.  However, given the concerns of tumor lysis-recommend admission the hospital 2-3 nights stay.   #Tumor lysis prophylaxis-IV fluids 0.9 normal saline 100 cc an hour; continue allopurinol.  Baseline labs-CBC CMP LDH uric acid magnesium phosphorus.  We will also repeat LDH uric acid magnesium phosphorus electrolytes every 4 hours.  Telemetry.  #Mild leukopenia/mild thrombocytopenia-normal hemoglobin-secondary to CLL- STABLE; no evidence of MDS.  #Weight loss of 20 pounds over the last 3 to 4 months.STABLE.   Continue recommend prednisone 20 mg a day.  #Gait instability/debility secondary to underlying CLL progression; recommend home health at discharge.  #  CC: Dr.Tate.    No orders of the defined types were placed in this encounter.  All questions were answered. The patient knows to call the clinic with any problems, questions or concerns.      Cammie Sickle, MD 03/08/2018 10:20 AM

## 2018-03-08 NOTE — Assessment & Plan Note (Addendum)
#  CLL-most recently on Ibrutinib. PET scan OCT 2019- moderate splenomegaly; slightly increased in size [compared to CT scan July 2019]; January 2020 MRI-approximately 11 cm splenic lesion/chronic but slowly growing; also about inch in size splenic lesion-new.   #Bone marrow biopsy shows involvement by CLL.  Karyotype is normal.  FISH panel is pending.  #Given the clinical progression-I would recommend switching the patient to venetoclax.  However, given the concerns of tumor lysis-recommend admission the hospital 2-3 nights stay.   #Tumor lysis prophylaxis-IV fluids 0.9 normal saline 100 cc an hour; continue allopurinol 300 mg BID;.  Baseline labs-CBC CMP LDH uric acid magnesium phosphorus.  We will also repeat LDH uric acid magnesium phosphorus electrolytes every 4 hours.  Telemetry.  #Mild leukopenia/mild thrombocytopenia-normal hemoglobin-secondary to CLL- STABLE; no evidence of MDS.  #Weight loss of 20 pounds over the last 3 to 4 months.STABLE.   Continue recommend prednisone 20 mg a day.  # Hypothyroidism- on synrthoid.  add TSH to labs today.   #Gait instability/debility secondary to underlying CLL progression; recommend home health at discharge.  # DISPOSITION:  # will be admitted to hospital for Venatoclax. Discussed with Dr.Kalisetti; kindly agrees to admit pt. # follow up to be decided based on discharge from hospital.   CC: Dr.Tate.

## 2018-03-08 NOTE — Progress Notes (Signed)
1028-Contacted bed placement- bed assigned to room 109. Will transport patient to Medical Mall/Admit/Registration. Call report to be provided to 1C

## 2018-03-08 NOTE — H&P (Signed)
[facility] NAME: Heather [name]    MR#:  [id number]  DATE OF BIRTH:  September 09, 1928  DATE OF ADMISSION:  03/08/2018  PRIMARY CARE PHYSICIAN: Albina Billet, MD   REQUESTING/REFERRING PHYSICIAN: Dr. Charlaine Dalton  CHIEF COMPLAINT:  No chief complaint on file.   HISTORY OF PRESENT ILLNESS:  Heather [name]  is a 85 y.o. female with a known history of CLL diagnosed in 2013, progressive disease in spite of ibrutinib recently, hypertension and hyperlipidemia was brought into hospital from oncology office secondary to being started on new chemotherapy. Patient was diagnosed with CLL in 2013, initially received chemotherapy with Rituxan and bendamustine.  With recurrent and progressive disease, she received ibrutinib until recently.  Most recent scan showing progressive splenomegaly and bone marrow biopsy confirming marrow involvement of CLL, plan is to start new chemotherapy with venteloclax and so oncologist requested admission to monitor for tumor lysis syndrome.  Patient stays at home by herself, no children but sisters check on her.  Ambulates with a cane.  Denies any complaints otherwise.  PAST MEDICAL HISTORY:   Past Medical History:  Diagnosis Date  . Anemia   . Cataracts, bilateral   . Chronic lymphocytic leukemia ([place]) 06/06/2014  . Hearing loss   . History of chemotherapy   . Hyperlipidemia   . Hypertension   . Hypothyroidism   . Palpitations   . Risk for falls   . Thrombocytopenia ([place])     PAST SURGICAL HISTORY:   Past Surgical History:  Procedure Laterality Date  . CATARACT EXTRACTION Left 03/29/2012  . Excision left cervical node biopsy  05/04/2011    SOCIAL HISTORY:   Social History   Tobacco Use  . Smoking status: Never Smoker  . Smokeless tobacco: Never Used  Substance Use Topics  . Alcohol use: No    Alcohol/week: 0.0 standard drinks    FAMILY HISTORY:   Family History  Problem Relation Age of Onset    . Heart disease Mother   . Stomach cancer Father   . Diabetes Sister     DRUG ALLERGIES:   Allergies  Allergen Reactions  . Losartan Other (See Comments)    Causes State of anxiety and hopelessness. Reported to RN on 03/08/18 that she is no longer taking this medication.    REVIEW OF SYSTEMS:   Review of Systems  Constitutional: Positive for malaise/fatigue. Negative for chills, fever and weight loss.  HENT: Negative for ear discharge, ear pain, hearing loss, nosebleeds and tinnitus.   Eyes: Negative for blurred vision, double vision and photophobia.  Respiratory: Negative for cough, hemoptysis, shortness of breath and wheezing.   Cardiovascular: Negative for chest pain, palpitations, orthopnea and leg swelling.  Gastrointestinal: Negative for abdominal pain, constipation, diarrhea, heartburn, melena, nausea and vomiting.  Genitourinary: Negative for dysuria, frequency, hematuria and urgency.  Musculoskeletal: Negative for back pain, myalgias and neck pain.  Skin: Negative for rash.  Neurological: Negative for dizziness, tingling, tremors, sensory change, speech change, focal weakness and headaches.  Endo/Heme/Allergies: Does not bruise/bleed easily.  Psychiatric/Behavioral: Negative for depression.    MEDICATIONS AT HOME:   Prior to Admission medications   Medication Sig Start Date End Date Taking? Authorizing Provider  allopurinol (ZYLOPRIM) 300 MG tablet Take 1 tablet (300 mg total) by mouth 2 (two) times daily. 03/04/18  Yes Cammie Sickle, MD  hydrALAZINE (APRESOLINE) 25 MG tablet TAKE 1 TABLET (25 MG TOTAL) BY MOUTH 2 (TWO) TIMES DAILY. 10/04/17  Yes Cammie Sickle, MD  levothyroxine (SYNTHROID, LEVOTHROID) 75 MCG tablet Take 75 mcg by mouth daily before breakfast. Only takes Saturday- Thursday. Hold synthroid on Friday 05/08/14  Yes [provider]  predniSONE (DELTASONE) 20 MG tablet Take 1 tablet (20 mg total) by mouth daily with breakfast. Once a  day with food x 10 days. 03/04/18  Yes Cammie Sickle, MD  venetoclax (VENCLEXTA STARTING PACK) 10 & 50 & 100 MG TBPK Take '20mg'$  by mouth daily for week one, '50mg'$  daily for week 2, '100mg'$  daily for week 3, '200mg'$  daily for week 4. Take as directed. 03/04/18  Yes Cammie Sickle, MD      VITAL SIGNS:  Blood pressure (!) 122/45, pulse 79, temperature 98.5 F (36.9 C), temperature source Oral, resp. rate 18, height 5' (1.524 m), weight 60.8 kg, SpO2 92 %.  PHYSICAL EXAMINATION:   Physical Exam  GENERAL:  85 y.o.-year-old elderly  patient lying in the bed with no acute distress.  EYES: Pupils equal, round, reactive to light and accommodation. No scleral icterus. Extraocular muscles intact.  HEENT: Head atraumatic, normocephalic. Oropharynx and nasopharynx clear.  NECK:  Supple, no jugular venous distention. No thyroid enlargement, no tenderness.  LUNGS: Normal breath sounds bilaterally, no wheezing, rales,rhonchi or crepitation. No use of accessory muscles of respiration.  CARDIOVASCULAR: S1, S2 normal. No murmurs, rubs, or gallops.  ABDOMEN: Soft, nontender, slightly distended. Bowel sounds present. No  mass. ? spleenomegaly EXTREMITIES: No pedal edema, cyanosis, or clubbing.  NEUROLOGIC: Cranial nerves II through XII are intact. Muscle strength 5/5 in all extremities. Sensation intact. Gait not checked. Global weakness noted PSYCHIATRIC: The patient is alert and oriented x 3.  SKIN: No obvious rash, lesion, or ulcer.   LABORATORY PANEL:   CBC Recent Labs  Lab 03/08/18 0857  WBC 3.7*  HGB 11.1*  HCT 34.8*  PLT 111*   ------------------------------------------------------------------------------------------------------------------  Chemistries  Recent Labs  Lab 03/08/18 0857  NA 137  K 3.9  CL 103  CO2 26  GLUCOSE 107*  BUN 18  CREATININE 0.92  CALCIUM 10.0  MG 1.8  AST 79*  ALT 54*  ALKPHOS 35*  BILITOT 1.3*    ------------------------------------------------------------------------------------------------------------------  Cardiac Enzymes No results for input(s): TROPONINI in the last 168 hours. ------------------------------------------------------------------------------------------------------------------  RADIOLOGY:  No results found.  EKG:   Orders placed or performed in visit on 02/23/18  . EKG 12-Lead  . EKG 12-Lead  . EKG 12-Lead    IMPRESSION AND PLAN:   Anabella Capshaw  is a 85 y.o. female with a known history of CLL diagnosed in 2013, progressive disease in spite of ibrutinib recently, hypertension and hyperlipidemia was brought into hospital from oncology office secondary to being started on new chemotherapy.  1.  Monitor for tumor lysis syndrome-being started on new chemotherapy by mouth, venetoclax as recommended by oncology. -Labs have been ordered.  She is already on allopurinol.  IV fluids started -Monitor uric acid, LDH, serum creatinine, potassium, phosphorus  2.  CLL-management as mentioned above.  Started on new chemotherapy  3.  Anemia, leukopenia and thrombocytopenia-secondary to CLL.  Stable at this time.  No MDS noted on bone marrow biopsy per oncology  4 4.  Hypothyroidism-check TSH level.  Restart Synthroid  5.  DVT prophylaxis-Lovenox  Physical therapy consult, might need home health at discharge    All the records are reviewed and case discussed with ED provider. Management plans discussed with the patient, family and they are in agreement.  CODE  STATUS: DNR  TOTAL TIME TAKING CARE OF THIS PATIENT: 51 minutes.    Gladstone Lighter M.D on 03/08/2018 at 5:13 PM  Between 7am to 6pm - Pager - [phone]  After 6pm go to [email or web address] - password [email or web address] [facility]  Office  [phone]  CC: Primary care physician; Albina Billet, MD

## 2018-03-08 NOTE — Progress Notes (Signed)
Patient presents to clinic for admission for close surveillance for [facility] new start.  She has taken her allopurinol and prednisone as directed for premeds.  Pt reports that she is no longer taking her losartan due to "feelings of anxiety and hopelessness everytime she takes the tablet." Reported to RN on 03/08/18 that she is no longer taking this medication.  Patient's gait is unstable. She has a walker at the home. *High fall risk. Pt states that she has not heard back from [facility] regarding the home health referral (which was faxed on 02/23/2018). I contacted [facility] and spoke to [place] (Glass blower/designer for [employer]). Confirmed that the home health referral was received by [facility], but the nurses processed the orders yesterday. A call was attempted this am by Colletta Maryland to reach the patient. I explained to her that the patient was in our office. Hand off of care was provided and Colletta Maryland was updated on the plan to admit the patient today for a minimal of 24 hours and perhaps longer if patient experiences any side effects to her oral chemotherapy. I reiterated that the patient's sisters are a key point of contact in arranging home health apts at the patient's residence. I also spoke to [place] Curator). Hand off of care was also provided to [place]. Sharee Pimple will attempt to arrange a home health visit on Thursday (pending patient is not in the hospital). She will also pass this information on to [place] (a point of contact for [facility] to [facility] Sharee Pimple will personally be out of the office this Thursday-Monday; however, her coworker Rip Harbour could also facilitate any home nursing care. Fax number was provided for the [facility] office to fax discharge summary to [facility] [phone]). [facility] phone number is [phone].

## 2018-03-08 NOTE — Telephone Encounter (Signed)
Oral Chemotherapy Pharmacist Encounter  Met with Heather [name] and her sister this morning for venetoclax re-education prior to her inpatient admission for venetoclax ramp-up and TLS monitoring. Provided patient with a dosing calendar and another copy of the venetoclax patient education sheet. Discussed importance of increasing oral hydration as able once discharged. Patient and sister were also provided with the phone number for the Oral Chemotherapy Navigation clinic and know to call with any questions or concerns.  Venetoclax start pack for week 1 will be delivered to the [facility] for dispensing during her inpatient stay.   Jalene Mullet, PharmD PGY2 Hematology/ Oncology Pharmacy Resident [facility]/HP/AP [facility] [phone]  03/08/2018 10:08 AM

## 2018-03-09 ENCOUNTER — Inpatient Hospital Stay (HOSPITAL_COMMUNITY)
Admission: AD | Admit: 2018-03-09 | Discharge: 2018-03-09 | Disposition: A | Discharge status: Home / Self Care | Payer: Medicare Other | Source: Ambulatory Visit | Attending: Internal Medicine | Admitting: Internal Medicine

## 2018-03-09 ENCOUNTER — Inpatient Hospital Stay: Admission: AD | Admit: 2018-03-09 | Payer: Medicare Other | Source: Ambulatory Visit | Admitting: Internal Medicine

## 2018-03-09 ENCOUNTER — Other Ambulatory Visit: Payer: Medicare Other

## 2018-03-09 DIAGNOSIS — I361 Nonrheumatic tricuspid (valve) insufficiency: Secondary | ICD-10-CM

## 2018-03-09 DIAGNOSIS — R5381 Other malaise: Secondary | ICD-10-CM

## 2018-03-09 DIAGNOSIS — D696 Thrombocytopenia, unspecified: Secondary | ICD-10-CM

## 2018-03-09 DIAGNOSIS — C9110 Chronic lymphocytic leukemia of B-cell type not having achieved remission: Secondary | ICD-10-CM

## 2018-03-09 DIAGNOSIS — E039 Hypothyroidism, unspecified: Secondary | ICD-10-CM

## 2018-03-09 DIAGNOSIS — I340 Nonrheumatic mitral (valve) insufficiency: Secondary | ICD-10-CM

## 2018-03-09 LAB — BASIC METABOLIC PANEL
Anion gap: 6 (ref 5–15)
Anion gap: 5 (ref 5–15)
Anion gap: 4 — ABNORMAL LOW (ref 5–15)
BUN: 18 mg/dL (ref 8–23)
BUN: 17 mg/dL (ref 8–23)
BUN: 16 mg/dL (ref 8–23)
CO2: 24 mmol/L (ref 22–32)
CO2: 23 mmol/L (ref 22–32)
CO2: 24 mmol/L (ref 22–32)
Calcium: 8.8 mg/dL — ABNORMAL LOW (ref 8.9–10.3)
Calcium: 8.8 mg/dL — ABNORMAL LOW (ref 8.9–10.3)
Calcium: 8.9 mg/dL (ref 8.9–10.3)
Chloride: 106 mmol/L (ref 98–111)
Chloride: 107 mmol/L (ref 98–111)
Chloride: 107 mmol/L (ref 98–111)
Creatinine, Ser: 0.85 mg/dL (ref 0.44–1.00)
Creatinine, Ser: 0.81 mg/dL (ref 0.44–1.00)
Creatinine, Ser: 0.76 mg/dL (ref 0.44–1.00)
GFR calc Af Amer: >60 mL/min (ref >60)
GFR calc Af Amer: >60 mL/min (ref >60)
GFR calc Af Amer: >60 mL/min (ref >60)
GFR calc non Af Amer: >60 mL/min (ref >60)
GFR calc non Af Amer: >60 mL/min (ref >60)
GFR calc non Af Amer: >60 mL/min (ref >60)
GLUCOSE: 99 mg/dL (ref 70–99)
GLUCOSE: 91 mg/dL (ref 70–99)
GLUCOSE: 128 mg/dL — AB (ref 70–99)
Potassium: 3.9 mmol/L (ref 3.5–5.1)
Potassium: 3.6 mmol/L (ref 3.5–5.1)
Potassium: 4 mmol/L (ref 3.5–5.1)
Sodium: 136 mmol/L (ref 135–145)
Sodium: 135 mmol/L (ref 135–145)
Sodium: 135 mmol/L (ref 135–145)

## 2018-03-09 LAB — LACTATE DEHYDROGENASE
LDH: 302 U/L — ABNORMAL HIGH (ref 98–192)
LDH: 339 U/L — ABNORMAL HIGH (ref 98–192)
LDH: 354 U/L — ABNORMAL HIGH (ref 98–192)

## 2018-03-09 LAB — PHOSPHORUS
Phosphorus: 3.2 mg/dL (ref 2.5–4.6)
Phosphorus: 3.2 mg/dL (ref 2.5–4.6)
Phosphorus: 3.1 mg/dL (ref 2.5–4.6)

## 2018-03-09 LAB — CBC
HCT: 31.4 % — ABNORMAL LOW (ref 36.0–46.0)
Hemoglobin: 9.8 g/dL — ABNORMAL LOW (ref 12.0–15.0)
MCH: 27.9 pg (ref 26.0–34.0)
MCHC: 31.2 g/dL (ref 30.0–36.0)
MCV: 89.5 fL (ref 80.0–100.0)
Platelets: 91 10*3/uL — ABNORMAL LOW (ref 150–400)
RBC: 3.51 MIL/uL — AB (ref 3.87–5.11)
RDW: 14.5 % (ref 11.5–15.5)
WBC: 2.5 10*3/uL — ABNORMAL LOW (ref 4.0–10.5)
nRBC: 0 % (ref 0.0–0.2)

## 2018-03-09 LAB — MAGNESIUM
Magnesium: 1.7 mg/dL (ref 1.7–2.4)
Magnesium: 1.7 mg/dL (ref 1.7–2.4)
Magnesium: 1.7 mg/dL (ref 1.7–2.4)

## 2018-03-09 LAB — ECHOCARDIOGRAM COMPLETE
Height: 60 in
Weight: 2144.63 oz

## 2018-03-09 LAB — URIC ACID
URIC ACID, SERUM: 2.9 mg/dL (ref 2.5–7.1)
Uric Acid, Serum: 3.4 mg/dL (ref 2.5–7.1)
Uric Acid, Serum: 3.2 mg/dL (ref 2.5–7.1)

## 2018-03-09 MED ORDER — METOPROLOL TARTRATE 25 MG PO TABS
25.0000 mg | ORAL_TABLET | Freq: Two times a day (BID) | ORAL | Status: DC
  Administered 2018-03-09 (×2): 25 mg via ORAL
  Filled 2018-03-09 (×2): qty 1

## 2018-03-09 MED ORDER — CARVEDILOL 3.125 MG PO TABS
3.1250 mg | ORAL_TABLET | Freq: Two times a day (BID) | ORAL | Status: DC
  Administered 2018-03-09: 3.125 mg via ORAL
  Filled 2018-03-09: qty 1

## 2018-03-09 NOTE — Assessment & Plan Note (Addendum)
#   CLL PET scan OCT 2019- moderate splenomegaly; slightly increased in size [compared to CT scan July 2019]; January 2020 MRI-approximately 11 cm splenic lesion/chronic but slowly growing; also about inch in size splenic lesion-new.   # currently on venatoclax 20 mg/day; week#1;day #2. NO obvious evidence of Tumor lysis.   #Tumor lysis prophylaxis-IV fluids 0.9; will decrease the fluids to 75 cc/hour.  Continue allopurinol.  # Mild leukopenia/mild thrombocytopenia-normal hemoglobin-secondary to CLL- monitor for low.   # run of Non-sustained VT- 6 beat run; check 2 d echo. Add low dose of coreg.   #Weight loss of 20 pounds over the last 3 to 4 months.STABLE.   Continue recommend prednisone 20 mg a day.  # Hypothyroidism- on synrthoid; TSH elevated at 8; Free T4- normal. continue current dose for now.   #Gait instability/debility secondary to underlying CLL progression; recommend home health at discharge.  Also recommend physical therapy evaluation.  # DISPOSITION: If clinically stable overnite today- can potentially be discharged tomorrow.   Thank you Dr.Kalisetti for allowing me to participate in the care of your pleasant patient. Please do not hesitate to contact me with questions or concerns in the interim.

## 2018-03-09 NOTE — Progress Notes (Signed)
*  PRELIMINARY RESULTS* Echocardiogram 2D Echocardiogram has been performed.  Sherrie Sport 03/09/2018, 1:15 PM

## 2018-03-09 NOTE — Consult Note (Signed)
[facility] NOTE  Patient Care Team: Albina Billet, MD as PCP - General (Internal Medicine)  CHIEF COMPLAINTS/PURPOSE OF CONSULTATION:  CLL/  HISTORY OF PRESENTING ILLNESS:  Heather [name] 85 y.o.  female with a history of progressive CLL on ibrutinib-is currently admitted to hospital for close monitoring for tumor lysis syndrome on venetoclax.  Patient had progressive weight loss poor appetite/generalized weakness.  Bone marrow biopsy done recently showed CLL; FISH findings pending.  Given the clinical progression of the disease patient was started on venetoclax.  Patient appetite is poor.  No nausea no vomiting.  No pain.  Feels weak.  Patient had episode of 6 run V. tach nonsustained last night.  Patient denies any chest pain.  Review of Systems  Constitutional: Positive for malaise/fatigue and weight loss. Negative for chills, diaphoresis and fever.  HENT: Negative for nosebleeds and sore throat.   Eyes: Negative for double vision.  Respiratory: Positive for shortness of breath. Negative for cough, hemoptysis, sputum production and wheezing.   Cardiovascular: Negative for chest pain, palpitations, orthopnea and leg swelling.  Gastrointestinal: Negative for abdominal pain, blood in stool, constipation, diarrhea, heartburn, melena, nausea and vomiting.  Genitourinary: Negative for dysuria, frequency and urgency.  Musculoskeletal: Negative for back pain and joint pain.  Skin: Negative.  Negative for itching and rash.  Neurological: Positive for weakness. Negative for dizziness, tingling, focal weakness and headaches.  Endo/Heme/Allergies: Does not bruise/bleed easily.  Psychiatric/Behavioral: Negative for depression. The patient is not nervous/anxious and does not have insomnia.      MEDICAL HISTORY:  Past Medical History:  Diagnosis Date  . Anemia   . Cataracts, bilateral   . Chronic lymphocytic leukemia ([facility]) 06/06/2014  . Hearing loss   . History of  chemotherapy   . Hyperlipidemia   . Hypertension   . Hypothyroidism   . Palpitations   . Risk for falls   . Thrombocytopenia ([place])     SURGICAL HISTORY: Past Surgical History:  Procedure Laterality Date  . CATARACT EXTRACTION Left 03/29/2012  . Excision left cervical node biopsy  05/04/2011    SOCIAL HISTORY: Social History   Socioeconomic History  . Marital status: Widowed    Spouse name: Not on file  . Number of children: Not on file  . Years of education: Not on file  . Highest education level: Not on file  Occupational History  . Not on file  Social Needs  . Financial resource strain: Not on file  . Food insecurity:    Worry: Never true    Inability: Never true  . Transportation needs:    Medical: No    Non-medical: No  Tobacco Use  . Smoking status: Never Smoker  . Smokeless tobacco: Never Used  Substance and Sexual Activity  . Alcohol use: No    Alcohol/week: 0.0 standard drinks  . Drug use: No  . Sexual activity: Not Currently  Lifestyle  . Physical activity:    Days per week: 0 days    Minutes per session: 0 min  . Stress: Only a little  Relationships  . Social connections:    Talks on phone: More than three times a week    Gets together: Three times a week    Attends religious service: 1 to 4 times per year    Active member of club or organization: Not on file    Attends meetings of clubs or organizations: Not on file    Relationship status: Widowed  . Intimate  partner violence:    Fear of current or ex partner: Not on file    Emotionally abused: No    Physically abused: No    Forced sexual activity: Not on file  Other Topics Concern  . Not on file  Social History Narrative  . Not on file    FAMILY HISTORY: Family History  Problem Relation Age of Onset  . Heart disease Mother   . Stomach cancer Father   . Diabetes Sister     ALLERGIES:  is allergic to losartan.  MEDICATIONS:  Current Facility-Administered Medications  Medication  Dose Route Frequency Provider Last Rate Last Dose  . 0.9 %  sodium chloride infusion   Intravenous Continuous Cammie Sickle, MD 75 mL/hr at 03/09/18 0802    . acetaminophen (TYLENOL) tablet 650 mg  650 mg Oral Q6H PRN Gladstone Lighter, MD       Or  . acetaminophen (TYLENOL) suppository 650 mg  650 mg Rectal Q6H PRN Gladstone Lighter, MD      . allopurinol (ZYLOPRIM) tablet 300 mg  300 mg Oral BID Gladstone Lighter, MD   300 mg at 03/08/18 2116  . carvedilol (COREG) tablet 3.125 mg  3.125 mg Oral BID WC Charlaine Dalton R, MD      . enoxaparin (LOVENOX) injection 40 mg  40 mg Subcutaneous Daily Gladstone Lighter, MD   40 mg at 03/08/18 1325  . hydrALAZINE (APRESOLINE) tablet 25 mg  25 mg Oral BID Gladstone Lighter, MD   25 mg at 03/08/18 2116  . levothyroxine (SYNTHROID, LEVOTHROID) tablet 75 mcg  75 mcg Oral QAC breakfast Gladstone Lighter, MD   75 mcg at 03/09/18 0645  . ondansetron (ZOFRAN) tablet 4 mg  4 mg Oral Q6H PRN Gladstone Lighter, MD       Or  . ondansetron (ZOFRAN) injection 4 mg  4 mg Intravenous Q6H PRN Gladstone Lighter, MD      . predniSONE (DELTASONE) tablet 20 mg  20 mg Oral Q breakfast Gladstone Lighter, MD      . venetoclax TABS 20 mg  20 mg Oral Daily Charlaine Dalton R, MD   20 mg at 03/08/18 1626      .  PHYSICAL EXAMINATION:  Vitals:   03/08/18 2047 03/09/18 0431  BP:  (!) 135/52  Pulse: 85 86  Resp:  17  Temp:  98.7 F (37.1 C)  SpO2: 95% 92%   Filed Weights   03/08/18 1142  Weight: 134 lb 0.6 oz (60.8 kg)    Physical Exam  Constitutional: She is oriented to person, place, and time.  Elderly frail-appearing Caucasian female patient.  She is resting in the bed.  HENT:  Head: Normocephalic and atraumatic.  Mouth/Throat: Oropharynx is clear and moist. No oropharyngeal exudate.  Eyes: Pupils are equal, round, and reactive to light.  Neck: Normal range of motion. Neck supple.  Cardiovascular: Normal rate and regular rhythm.   Pulmonary/Chest: Effort normal and breath sounds normal. No respiratory distress. She has no wheezes.  Abdominal: Soft. Bowel sounds are normal. She exhibits no distension and no mass. There is no abdominal tenderness. There is no rebound and no guarding.  Musculoskeletal: Normal range of motion.        General: No tenderness or edema.  Neurological: She is alert and oriented to person, place, and time.  Skin: Skin is warm.  Psychiatric: Affect normal.   LABORATORY DATA:  I have reviewed the data as listed Lab Results  Component Value Date   WBC  2.5 (L) 03/09/2018   HGB 9.8 (L) 03/09/2018   HCT 31.4 (L) 03/09/2018   MCV 89.5 03/09/2018   PLT 91 (L) 03/09/2018   Recent Labs    02/17/18 1051  02/23/18 0355  03/08/18 0857 03/08/18 2002 03/09/18 0142 03/09/18 0744  NA 132*   < > 134*   < > 137 135 136 135  K 4.0   < > 3.6   < > 3.9 3.8 3.9 3.6  CL 98   < > 103   < > 103 104 106 107  CO2 26   < > 27   < > '26 25 24 23  '$ GLUCOSE 97   < > 108*   < > 107* 97 99 91  BUN 10   < > 11   < > '18 21 18 17  '$ CREATININE 0.80   < > 0.70   < > 0.92 0.98 0.85 0.81  CALCIUM 9.0   < > 8.1*   < > 10.0 9.1 8.8* 8.8*  GFRNONAA >60   < > >60   < > 56* 52* >60 >60  GFRAA >60   < > >60   < > >60 >60 >60 >60  PROT 6.9  --  5.2*  --  6.4*  --   --   --   ALBUMIN 4.0  --  3.0*  --  3.6  --   --   --   AST 43*  --  37  --  79*  --   --   --   ALT 23  --  17  --  54*  --   --   --   ALKPHOS 36*  --  26*  --  35*  --   --   --   BILITOT 1.7*  --  1.6*  --  1.3*  --   --   --    < > = values in this interval not displayed.    RADIOGRAPHIC STUDIES: I have personally reviewed the radiological images as listed and agreed with the findings in the report. Ct Head Wo Contrast  Result Date: 02/18/2018 CLINICAL DATA:  Mechanical fall at home. No loss of consciousness. Dizziness. History of hypertension, hyperlipidemia and chronic lymphocytic leukemia. EXAM: CT HEAD WITHOUT CONTRAST TECHNIQUE: Contiguous axial  images were obtained from the base of the skull through the vertex without intravenous contrast. COMPARISON:  PET-CT October 27, 2017 FINDINGS: BRAIN: No intraparenchymal hemorrhage, mass effect nor midline shift. No parenchymal brain volume loss for age. Cavum velum interpositum. No hydrocephalus. Patchy supratentorial white matter hypodensities less than expected for patient's age, though non-specific are most compatible with chronic small vessel ischemic disease. Old RIGHT basal ganglia infarct with mild ex vacuo dilatation subjacent ventricle. No acute large vascular territory infarcts. No abnormal extra-axial fluid collections. Basal cisterns are patent. Fall seen extra-axial dural calcifications. VASCULAR: Moderate calcific atherosclerosis of the carotid siphons. SKULL: No skull fracture. No significant scalp soft tissue swelling. SINUSES/ORBITS: Trace paranasal sinus mucosal thickening. Trace mastoid effusions. Included ocular globes and orbital contents are non-suspicious. Status post bilateral ocular lens implants. OTHER: None. IMPRESSION: 1. No acute intracranial process. 2. Mild chronic small vessel ischemic changes and old RIGHT basal ganglia infarct. Electronically Signed   By: Elon Alas M.D.   On: 02/18/2018 19:47   Ct Bone Marrow Biopsy & Aspiration  Result Date: 02/21/2018 CLINICAL DATA:  Chronic lymphocytic leukemia EXAM: CT GUIDED DEEP ILIAC BONE ASPIRATION AND CORE BIOPSY TECHNIQUE: Patient  was placed prone on the CT gantry and limited axial scans through the pelvis were obtained. Appropriate skin entry site was identified. Skin site was marked, prepped with chlorhexidine, draped in usual sterile fashion, and infiltrated locally with 1% lidocaine. Intravenous Fentanyl and Versed were administered as conscious sedation during continuous monitoring of the patient's level of consciousness and physiological / cardiorespiratory status by the radiology RN, with a total moderate sedation time  of 8 minutes. Under CT fluoroscopic guidance an 11-gauge Cook trocar bone needle was advanced into the right iliac bone just lateral to the sacroiliac joint. Once needle tip position was confirmed, core and aspiration samples were obtained, submitted to pathology for approval. Post procedure scans show no hematoma or fracture. Patient tolerated procedure well. COMPLICATIONS: COMPLICATIONS none IMPRESSION: 1. Technically successful CT guided right iliac bone core and aspiration biopsy. Electronically Signed   By: Lucrezia Europe M.D.   On: 02/21/2018 11:16    Chronic lymphocytic leukemia ([place]) # CLL PET scan OCT 2019- moderate splenomegaly; slightly increased in size [compared to CT scan July 2019]; January 2020 MRI-approximately 11 cm splenic lesion/chronic but slowly growing; also about inch in size splenic lesion-new.   # currently on venatoclax 20 mg/day; week#1;day #2. NO obvious evidence of Tumor lysis.   #Tumor lysis prophylaxis-IV fluids 0.9; will decrease the fluids to 75 cc/hour.  Continue allopurinol.  # Mild leukopenia/mild thrombocytopenia-normal hemoglobin-secondary to CLL- monitor for low.   # run of Non-sustained VT- 6 beat run; check 2 d echo. Add low dose of coreg.   #Weight loss of 20 pounds over the last 3 to 4 months.STABLE.   Continue recommend prednisone 20 mg a day.  # Hypothyroidism- on synrthoid; TSH elevated at 8; Free T4- normal. continue current dose for now.   #Gait instability/debility secondary to underlying CLL progression; recommend home health at discharge.  Also recommend physical therapy evaluation.  # DISPOSITION: If clinically stable overnite today- can potentially be discharged tomorrow.   Thank you Dr.Kalisetti for allowing me to participate in the care of your pleasant patient. Please do not hesitate to contact me with questions or concerns in the interim.     All questions were answered. The patient knows to call the clinic with any problems, questions  or concerns.    Cammie Sickle, MD 03/09/2018 8:17 AM

## 2018-03-09 NOTE — Evaluation (Signed)
Occupational Therapy Evaluation Patient Details Name: Heather [name] MRN: [id number] DOB: 10/03/1928 Today's Date: 03/09/2018    History of Present Illness Pt is 85 y.o. female with a known history of CLL diagnosed in 2013, progressive disease in spite of ibrutinib recently, hypertension and hyperlipidemia was brought into hospital from oncology office secondary to being started on new chemotherapy. PMH of HTN, HLD, HOH, hypothyroidism   Clinical Impression   Pt seen for OT evaluation this date. Pt was independent in all ADLs and mobility, living in an ILF apartment, using a 4WW for MD appointments, and has her sister who helps with medication mgt, meals, and transportation. Pt reports becoming easily fatigued or out of breath with minimize exertion, making it increasingly difficult to perform basic ADL tasks independently. Pt currently requires Min-Mod assist for LB ADL and CGA for toilet transfers due to poor activity tolerance. Pt educated in energy conservation conservation strategies including pursed lip breathing, activity pacing, home/routines modifications, work simplification, AE/DME, prioritizing of meaningful occupations, and falls prevention. Handout provided. Pt verbalized understanding but would benefit from additional skilled OT services to maximize recall and carryover of learned techniques and facilitate implementation of learned techniques into daily routines. Upon discharge, recommend [facility] services. Also recommend [facility] Aide.     Follow Up Recommendations  Home health OT;Supervision - Intermittent    Equipment Recommendations  Other (comment);3 in 1 bedside commode(reacher, LH sponge, bed rail)    Recommendations for Other Services       Precautions / Restrictions Precautions Precautions: Fall Precaution Comments: chemotherapy precautions Restrictions Weight Bearing Restrictions: No      Mobility Bed Mobility     General bed mobility comments: deferred, up in recliner at  start and end of session  Transfers Overall transfer level: Needs assistance Equipment used: Rolling walker (2 wheeled) Transfers: Sit to/from Stand Sit to Stand: Min guard;Supervision         General transfer comment: VC for hand placement    Balance Overall balance assessment: Mild deficits observed, not formally tested                                         ADL either performed or assessed with clinical judgement   ADL Overall ADL's : Needs assistance/impaired                                       General ADL Comments: Pt requires Min-Mod A for LB ADL tasks and CGA for toilet transfers and functional mobility with RW, pt limited by fatigue     Vision Baseline Vision/History: (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 RN aware, RN and OT assisted in readjusting pt's position in recliner with pt reporting improvement in discomfort Pain Descriptors / Indicators: Discomfort Pain Intervention(s): Limited activity within patient's tolerance;Monitored during session;Repositioned     Hand Dominance     Extremity/Trunk Assessment Upper Extremity Assessment Upper Extremity Assessment: Generalized weakness   Lower Extremity Assessment Lower Extremity Assessment: Generalized weakness  Cervical / Trunk Assessment Cervical / Trunk Assessment: Kyphotic   Communication Communication Communication: HOH   Cognition Arousal/Alertness: Awake/alert Behavior During Therapy: WFL for tasks assessed/performed Overall Cognitive Status: No family/caregiver present to determine baseline cognitive functioning                                 General  Comments: Pt alert and oriented x3 (x place), able to follow simple commands, believed she was in her ILF apartment requiring additional cues to redirect and reorient her   General Comments       Exercises Other Exercises Other Exercises: pt educated in energy conservation strategies including home/routines modifications, AE/DME, positioning, work simplification, activity pacing, rest breaks, and pursed lip breathing to maximize pt's safety and independence with ADL tasks   Shoulder Instructions      Home Living Family/patient expects to be discharged to:: Private residence Living Arrangements: Alone Available Help at Discharge: Family;Available PRN/intermittently Type of Home: Independent living facility Home Access: Level entry     Home Layout: One level     Bathroom Shower/Tub: Occupational psychologist: Standard Bathroom Accessibility: Yes   Home Equipment: Environmental consultant - 4 wheels;Cane - single point;Grab bars - tub/shower   Additional Comments: reported 1 fall in the last 6 months      Prior Functioning/Environment Level of Independence: Needs assistance  Gait / Transfers Assistance Needed: Pt reported that she uses her 4WW when she goes out to doctors appts.  ADL's / Homemaking Assistance Needed: [employer], sister assists with grocery shopping and cooking occasionally, pt endorses that she usually snacks. Pt reports that ADL tasks are becoming increasingly difficult for her to perform 2/2 fatigue            OT Problem List: Decreased strength;Decreased knowledge of use of DME or AE;Decreased activity tolerance;Decreased cognition;Pain;Decreased safety awareness;Impaired balance (sitting and/or standing)      OT Treatment/Interventions: Self-care/ADL training;Balance training;Therapeutic exercise;Therapeutic activities;Energy conservation;DME and/or AE instruction;Cognitive remediation/compensation;Patient/family education    OT Goals(Current goals can be found in  the care plan section) Acute Rehab OT Goals Patient Stated Goal: Patient would to like to get strength back Time For Goal Achievement: 03/23/18 Potential to Achieve Goals: Good ADL Goals Pt Will Perform Lower Body Dressing: with min assist;sit to/from stand;with adaptive equipment Pt Will Transfer to Toilet: with supervision;ambulating(elevated commode, LRAD for amb) Additional ADL Goal #1: Pt will verbalize plan to implement at least 2 learned ECS to maximize safety and independence while minimizing risk of SOB or overexertion during ADL tasks. Additional ADL Goal #2: Pt will utilize learned PLB during ADL tasks to support energy conservation and minimize over exertion or SOB.  OT Frequency: Min 1X/week   Barriers to D/C: Decreased caregiver support          Co-evaluation              AM-PAC OT "6 Clicks" Daily Activity     Outcome Measure Help from another person eating meals?: None Help from another person taking care of personal grooming?: None Help from another person toileting, which includes using toliet, bedpan, or urinal?: A Little Help from another person bathing (including washing, rinsing, drying)?: A Lot Help from another person to put on and taking off regular upper body clothing?: None Help from another person to put on and taking off regular lower body clothing?: A Lot 6 Click Score: 19   End of Session  Activity Tolerance: Patient limited by fatigue Patient left: in chair;with chair alarm set  OT Visit Diagnosis: Other abnormalities of gait and mobility (R26.89);Muscle weakness (generalized) (M62.81);History of falling (Z91.81)                Time: 5102-5852 OT Time Calculation (min): 47 min Charges:  OT General Charges $OT Visit: 1 Visit OT Evaluation $OT Eval Moderate Complexity: 1 Mod OT Treatments $Self Care/Home Management : 23-37 mins  Jeni Salles, MPH, MS, OTR/L ascom [phone] 03/09/18, 11:58 AM

## 2018-03-09 NOTE — Care Management Note (Addendum)
Case Management Note  Patient Details  Name: Heather [name] MRN: [id number] Date of Birth: 05/20/1928  Subjective/Objective:                 Patient is admitted for initiation of new chemotherapy medication for CLL. Requires close observation for tumor lysis syndrome.  She resides in retirement [place].  She has emergency pulls in her apartment. Has walker, . Currently receiving no services in the home.  Her sisters bring her meals. This location is a community meal site but patient does not participate. She says she is very weak.  Would prefer to go home and agreeable to have home health services.  She has medicaid.  Patient would probably benefit and meet criteria for medicaid personal care services.  Her sisters take her to appointments and denies she has  problems obtaining her medications. She does admit to not getting out much now because "I just don't feel good.  Has She does not have agency preference for home health. Anticipate need for RN PT OT Aide SW  Action/Plan:  Spoke with Gaspar Bidding from [facility] and faxed him the referral for medicaid pcs for her oncologist to complete.  Expected Discharge Date:                  Expected Discharge Plan:     In-House Referral:     Discharge planning Services     Post Acute Care Choice:    Choice offered to:     DME Arranged:    DME Agency:     HH Arranged:    HH Agency:     Status of Service:     If discussed at [employer], dates discussed:    Additional Comments:  Katrina Stack, RN 03/09/2018, 4:21 PM

## 2018-03-09 NOTE — Plan of Care (Signed)
  Problem: Education: Goal: Knowledge of General Education information will improve Description Including pain rating scale, medication(s)/side effects and non-pharmacologic comfort measures Outcome: Progressing   Problem: Health Behavior/Discharge Planning: Goal: Ability to manage health-related needs will improve Outcome: Progressing   Problem: Clinical Measurements: Goal: Ability to maintain clinical measurements within normal limits will improve Outcome: Progressing Goal: Will remain free from infection Outcome: Progressing Goal: Diagnostic test results will improve Outcome: Progressing   Problem: Activity: Goal: Risk for activity intolerance will decrease Outcome: Progressing   Problem: Nutrition: Goal: Adequate nutrition will be maintained Outcome: Progressing   Problem: Pain Managment: Goal: General experience of comfort will improve Outcome: Progressing   Problem: Safety: Goal: Ability to remain free from injury will improve Outcome: Progressing   Problem: Skin Integrity: Goal: Risk for impaired skin integrity will decrease Outcome: Progressing   Problem: Education: Goal: Knowledge of the prescribed therapeutic regimen will improve Outcome: Progressing   Problem: Bowel/Gastric: Goal: Occurrences of nausea will decrease Outcome: Progressing Goal: Bowel function will improve Outcome: Progressing   Problem: Clinical Measurements: Goal: Will remain free from infection Outcome: Progressing

## 2018-03-09 NOTE — Progress Notes (Signed)
[facility] [place] NAME: Heather [name]    MR#:  [id number]  DATE OF BIRTH:  1928-01-30  SUBJECTIVE:  CHIEF COMPLAINT:  No chief complaint on file.  Admitted to monitoring hospital for tumor lysis syndrome with this new chemotherapy agent.  REVIEW OF SYSTEMS:  CONSTITUTIONAL: No fever, have fatigue or weakness.  EYES: No blurred or double vision.  EARS, NOSE, AND THROAT: No tinnitus or ear pain.  RESPIRATORY: No cough, shortness of breath, wheezing or hemoptysis.  CARDIOVASCULAR: No chest pain, orthopnea, edema.  GASTROINTESTINAL: No nausea, vomiting, diarrhea or abdominal pain.  GENITOURINARY: No dysuria, hematuria.  ENDOCRINE: No polyuria, nocturia,  HEMATOLOGY: No anemia, easy bruising or bleeding SKIN: No rash or lesion. MUSCULOSKELETAL: No joint pain or arthritis.   NEUROLOGIC: No tingling, numbness, weakness.  PSYCHIATRY: No anxiety or depression.   ROS  DRUG ALLERGIES:   Allergies  Allergen Reactions  . Losartan Other (See Comments)    Causes State of anxiety and hopelessness. Reported to RN on 03/08/18 that she is no longer taking this medication.    VITALS:  Blood pressure (!) 135/52, pulse 86, temperature 98.7 F (37.1 C), temperature source Oral, resp. rate 17, height 5' (1.524 m), weight 60.8 kg, SpO2 92 %.  PHYSICAL EXAMINATION:  GENERAL:  85 y.o.-year-old patient lying in the bed with no acute distress.  EYES: Pupils equal, round, reactive to light and accommodation. No scleral icterus. Extraocular muscles intact.  HEENT: Head atraumatic, normocephalic. Oropharynx and nasopharynx clear.  NECK:  Supple, no jugular venous distention. No thyroid enlargement, no tenderness.  LUNGS: Normal breath sounds bilaterally, no wheezing, rales,rhonchi or crepitation. No use of accessory muscles of respiration.  CARDIOVASCULAR: S1, S2 normal. No murmurs, rubs, or gallops.  ABDOMEN: Soft, nontender, nondistended. Bowel sounds present.  No organomegaly or mass.  EXTREMITIES: No pedal edema, cyanosis, or clubbing.  NEUROLOGIC: Cranial nerves II through XII are intact. Muscle strength 4/5 in all extremities.  Generalized weakness.  Sensation intact. Gait not checked.  PSYCHIATRIC: The patient is alert and oriented x 3.  SKIN: No obvious rash, lesion, or ulcer.   Physical Exam LABORATORY PANEL:   CBC Recent Labs  Lab 03/09/18 0142  WBC 2.5*  HGB 9.8*  HCT 31.4*  PLT 91*   ------------------------------------------------------------------------------------------------------------------  Chemistries  Recent Labs  Lab 03/08/18 0857  03/09/18 1418  NA 137   < > 135  K 3.9   < > 4.0  CL 103   < > 107  CO2 26   < > 24  GLUCOSE 107*   < > 128*  BUN 18   < > 16  CREATININE 0.92   < > 0.76  CALCIUM 10.0   < > 8.9  MG 1.8   < > 1.7  AST 79*  --   --   ALT 54*  --   --   ALKPHOS 35*  --   --   BILITOT 1.3*  --   --    < > = values in this interval not displayed.   ------------------------------------------------------------------------------------------------------------------  Cardiac Enzymes No results for input(s): TROPONINI in the last 168 hours. ------------------------------------------------------------------------------------------------------------------  RADIOLOGY:  No results found.  ASSESSMENT AND PLAN:   Active Problems:   Chronic lymphocytic leukemia ([place])   Tumor lysis syndrome  Heather [name]  is a 85 y.o. female with a known history of CLL diagnosed in 2013, progressive disease in spite of ibrutinib recently, hypertension and hyperlipidemia was brought into  hospital from oncology office secondary to being started on new chemotherapy.  1.  Monitor for tumor lysis syndrome-being started on new chemotherapy by mouth, venetoclax as   recommended by oncology. - Labs have been ordered.  She is  on allopurinol.  IV fluids. - Monitor uric acid, LDH, serum creatinine, potassium, phosphorus. -  Oncology advised to monitor for tonight.  2.  CLL-management as mentioned above.  Started on new chemotherapy  3.  Anemia, leukopenia and thrombocytopenia-secondary to CLL.  Stable at this time.  No MDS noted on bone marrow biopsy per oncology.  4.  Hypothyroidism-check TSH level.  Restart Synthroid  5.  DVT prophylaxis-Lovenox     All the records are reviewed and case discussed with Care Management/Social Workerr. Management plans discussed with the patient, family and they are in agreement.  CODE STATUS: DNR  TOTAL TIME TAKING CARE OF THIS PATIENT: 35 minutes.     POSSIBLE D/C IN 1-2 DAYS, DEPENDING ON CLINICAL CONDITION.   Vaughan Basta M.D on 03/09/2018   Between 7am to 6pm - Pager - [phone]  After 6pm go to [email or web address] - password [email or web address] [facility]  Office  [phone]  CC: Primary care physician; Albina Billet, MD  Note: This dictation was prepared with Dragon dictation along with smaller phrase technology. Any transcriptional errors that result from this process are unintentional.

## 2018-03-09 NOTE — Evaluation (Addendum)
Physical Therapy Evaluation Patient Details Name: Heather [name] MRN: [id number] DOB: 1928-10-29 Today's Date: 03/09/2018   History of Present Illness  Pt is 85 y.o. female with a known history of CLL diagnosed in 2013, progressive disease in spite of ibrutinib recently, hypertension and hyperlipidemia was brought into hospital from oncology office secondary to being started on new chemotherapy. PMH of HTN, HLD, HOH, hypothyroidism    Clinical Impression  Patient in bed, agreeable to PT, no complaints at rest. Patient reported that she lives alone, in an "senior living place", independent with dressing and bathing (sponge baths), sister assists with IADLs, pt endorsed that she uses a 4WW.   The patient demonstrated bed mobility with supervision and HOB elevated, extended time needed but able to complete without physical assist. Sit <> stand multiple times during session, verbal cues for hand placement and to improve safety, supervision/CGA. Patient was able to ambulate ~35ft in room with supervision/CGA and RW, no LOB, significantly decreased gait velocity, shuffling step.  Overall the patient demonstrated deficits (see "PT Problem List") that impede the patient's functional abilities, safety, and mobility and would benefit from skilled PT intervention. Recommendations is HHPT with intermittent supervision.      Follow Up Recommendations Home health PT;Supervision - Intermittent(RN&aide)    Equipment Recommendations  None recommended by PT(Pt reported have 4ww at home)    Recommendations for Other Services OT consult     Precautions / Restrictions Precautions Precautions: Fall Precaution Comments: chemotherapy precautions Restrictions Weight Bearing Restrictions: No      Mobility  Bed Mobility Overal bed mobility: Needs Assistance Bed Mobility: Supine to Sit     Supine to sit: Supervision;HOB elevated     General bed mobility comments: use of bed rails, extended time, but able to  complete without physical assist  Transfers Overall transfer level: Needs assistance Equipment used: Rolling walker (2 wheeled) Transfers: Sit to/from Stand Sit to Stand: Min guard;Supervision         General transfer comment: Verbal cues for hand placement for safe use of RW  Ambulation/Gait Ambulation/Gait assistance: Min guard;Supervision Gait Distance (Feet): 30 Feet Assistive device: Rolling walker (2 wheeled)   Gait velocity: decreased   General Gait Details: slow, shuffling steps, HR 110 BPM. Overall steady, no LOB noted.  Stairs            Wheelchair Mobility    Modified Rankin (Stroke Patients Only)       Balance Overall balance assessment: Mild deficits observed, not formally tested                                           Pertinent Vitals/Pain Pain Assessment: No/denies pain    Home Living Family/patient expects to be discharged to:: Private residence Living Arrangements: Alone Available Help at Discharge: Family;Available PRN/intermittently Type of Home: Independent living facility Home Access: Level entry     Home Layout: One level Home Equipment: Walker - 4 wheels;Cane - single point;Grab bars - tub/shower Additional Comments: reported 1 fall in the last 6 months    Prior Function Level of Independence: Needs assistance   Gait / Transfers Assistance Needed: Pt reported that she uses her 4WW when she goes out to doctors appts.   ADL's / Homemaking Assistance Needed: [employer], sister assists with grocery shopping and cooking occasionally, pt endorses that she usually snacks.  Hand Dominance        Extremity/Trunk Assessment   Upper Extremity Assessment Upper Extremity Assessment: Generalized weakness    Lower Extremity Assessment Lower Extremity Assessment: Generalized weakness    Cervical / Trunk Assessment Cervical / Trunk Assessment: Kyphotic  Communication   Communication: HOH  Cognition  Arousal/Alertness: Awake/alert Behavior During Therapy: WFL for tasks assessed/performed Overall Cognitive Status: Within Functional Limits for tasks assessed                                        General Comments      Exercises     Assessment/Plan    PT Assessment Patient needs continued PT services  PT Problem List         PT Treatment Interventions DME instruction;Therapeutic exercise;Gait training;Balance training;Stair training;Neuromuscular re-education;Functional mobility training;Therapeutic activities;Patient/family education    PT Goals (Current goals can be found in the Care Plan section)  Acute Rehab PT Goals Patient Stated Goal: Patient would to like to get strength back PT Goal Formulation: With patient Time For Goal Achievement: 03/23/18 Potential to Achieve Goals: Fair    Frequency Min 2X/week   Barriers to discharge        Co-evaluation               AM-PAC PT "6 Clicks" Mobility  Outcome Measure Help needed turning from your back to your side while in a flat bed without using bedrails?: A Little Help needed moving from lying on your back to sitting on the side of a flat bed without using bedrails?: A Little Help needed moving to and from a bed to a chair (including a wheelchair)?: A Little Help needed standing up from a chair using your arms (e.g., wheelchair or bedside chair)?: A Little Help needed to walk in hospital room?: A Little Help needed climbing 3-5 steps with a railing? : Total 6 Click Score: 16    End of Session Equipment Utilized During Treatment: Gait belt Activity Tolerance: Patient limited by fatigue Patient left: in chair;with chair alarm set;with call bell/phone within reach Nurse Communication: Mobility status PT Visit Diagnosis: Unsteadiness on feet (R26.81);Difficulty in walking, not elsewhere classified (R26.2);Muscle weakness (generalized) (M62.81);History of falling (Z91.81)    Time: 6720-9470 PT  Time Calculation (min) (ACUTE ONLY): 36 min   Charges:   PT Evaluation $PT Eval Moderate Complexity: 1 Mod PT Treatments $Therapeutic Activity: 8-22 mins      Lieutenant Diego PT, DPT 10:39 AM,03/09/18 [phone]

## 2018-03-10 ENCOUNTER — Telehealth: Payer: Self-pay | Admitting: Pharmacist

## 2018-03-10 ENCOUNTER — Telehealth: Payer: Self-pay | Admitting: Internal Medicine

## 2018-03-10 DIAGNOSIS — C9110 Chronic lymphocytic leukemia of B-cell type not having achieved remission: Secondary | ICD-10-CM

## 2018-03-10 LAB — CBC WITH DIFFERENTIAL/PLATELET
Abs Immature Granulocytes: 0.17 10*3/uL — ABNORMAL HIGH (ref 0.00–0.07)
BASOS PCT: 0 %
Basophils Absolute: 0 10*3/uL (ref 0.0–0.1)
EOS PCT: 0 %
Eosinophils Absolute: 0 10*3/uL (ref 0.0–0.5)
HCT: 33.4 % — ABNORMAL LOW (ref 36.0–46.0)
Hemoglobin: 10.4 g/dL — ABNORMAL LOW (ref 12.0–15.0)
Immature Granulocytes: 7 %
Lymphocytes Relative: 45 %
Lymphs Abs: 1 10*3/uL (ref 0.7–4.0)
MCH: 28 pg (ref 26.0–34.0)
MCHC: 31.1 g/dL (ref 30.0–36.0)
MCV: 90 fL (ref 80.0–100.0)
Monocytes Absolute: 0.4 10*3/uL (ref 0.1–1.0)
Monocytes Relative: 17 %
Neutro Abs: 0.7 10*3/uL — ABNORMAL LOW (ref 1.7–7.7)
Neutrophils Relative %: 31 %
Platelets: 96 10*3/uL — ABNORMAL LOW (ref 150–400)
RBC: 3.71 MIL/uL — ABNORMAL LOW (ref 3.87–5.11)
RDW: 14.4 % (ref 11.5–15.5)
WBC: 2.4 10*3/uL — ABNORMAL LOW (ref 4.0–10.5)
nRBC: 0 % (ref 0.0–0.2)

## 2018-03-10 LAB — BASIC METABOLIC PANEL
Anion gap: 4 — ABNORMAL LOW (ref 5–15)
BUN: 17 mg/dL (ref 8–23)
CO2: 23 mmol/L (ref 22–32)
Calcium: 8.6 mg/dL — ABNORMAL LOW (ref 8.9–10.3)
Chloride: 110 mmol/L (ref 98–111)
Creatinine, Ser: 0.79 mg/dL (ref 0.44–1.00)
GFR calc Af Amer: >60 mL/min (ref >60)
Glucose, Bld: 102 mg/dL — ABNORMAL HIGH (ref 70–99)
Potassium: 3.7 mmol/L (ref 3.5–5.1)
Sodium: 137 mmol/L (ref 135–145)

## 2018-03-10 LAB — LACTATE DEHYDROGENASE: LDH: 297 U/L — AB (ref 98–192)

## 2018-03-10 LAB — MAGNESIUM: Magnesium: 1.7 mg/dL (ref 1.7–2.4)

## 2018-03-10 LAB — URIC ACID: Uric Acid, Serum: 2.6 mg/dL (ref 2.5–7.1)

## 2018-03-10 LAB — PHOSPHORUS: Phosphorus: 4.1 mg/dL (ref 2.5–4.6)

## 2018-03-10 MED ORDER — METOPROLOL TARTRATE 25 MG PO TABS: 25.0000 mg | ORAL_TABLET | Freq: Two times a day (BID) | ORAL | 0 refills | Status: AC

## 2018-03-10 MED ORDER — VENETOCLAX 10 MG PO TABS: 20.0000 mg | ORAL_TABLET | Freq: Every day | ORAL | 0 refills | Status: DC

## 2018-03-10 NOTE — Telephone Encounter (Signed)
Please have pt follow up with me on 2/25- Tuesday AM- labs [ please order- cbc/cmp/ldh/phos/mag/uric acid].   Thanks GB

## 2018-03-10 NOTE — Progress Notes (Signed)
No evidence of tumor lysis noted.  Clinically stable.  pt discharged from hospital.  Discussed with Dr. Anselm Jungling.  follow-up with me in the clinic 1 week.

## 2018-03-10 NOTE — Discharge Summary (Signed)
[facility] NAME: Heather [name]    MR#:  [id number]  DATE OF BIRTH:  09-12-1928  DATE OF ADMISSION:  03/08/2018 ADMITTING PHYSICIAN: Gladstone Lighter, MD  DATE OF DISCHARGE: 03/10/2018 10:48 AM  PRIMARY CARE PHYSICIAN: Albina Billet, MD    ADMISSION DIAGNOSIS:  CLL  DISCHARGE DIAGNOSIS:  Active Problems:   Chronic lymphocytic leukemia ([place])   Tumor lysis syndrome   SECONDARY DIAGNOSIS:   Past Medical History:  Diagnosis Date  . Anemia   . Cataracts, bilateral   . Chronic lymphocytic leukemia ([place]) 06/06/2014  . Hearing loss   . History of chemotherapy   . [name]   . Heather   . Hypothyroidism   . Palpitations   . Risk for falls   . Thrombocytopenia [facility])     HOSPITAL COURSE:   Heather [name], Heather [name], Heather [name] was brought into hospital from oncology office secondary to being started on new chemotherapy.  1. Monitor for tumor lysis syndrome-being started on new chemotherapy by mouth, venetoclax as   recommended by oncology. - Labs have been ordered. She is  on allopurinol. IV fluids. - Monitor uric acid, LDH,serum creatinine, potassium, phosphorus. - Oncology advised to monitor - after 24 hrs more- labs stable. - advised to discharge and follow in Cancer center.  2. CLL-management as mentioned above. Started on new chemotherapy  3. Anemia, leukopenia and thrombocytopenia-secondary to CLL. Stable at this time. No MDS noted on bone marrow biopsy per oncology.  4. Hypothyroidism-check TSH level. Restart Synthroid  5. DVT prophylaxis-Lovenox  DISCHARGE CONDITIONS:   Stable.  CONSULTS OBTAINED:  Treatment Team:  Cammie Sickle, MD  DRUG ALLERGIES:   Allergies  Allergen Reactions  . Losartan Other (See Comments)    Causes State of anxiety  and hopelessness. Reported to RN on 03/08/18 that she is no longer taking this medication.    DISCHARGE MEDICATIONS:   Allergies as of 03/10/2018      Reactions   Losartan Other (See Comments)   Causes State of anxiety and hopelessness. Reported to RN on 03/08/18 that she is no longer taking this medication.      Medication List    STOP taking these medications   hydrALAZINE 25 MG tablet Commonly known as:  APRESOLINE     TAKE these medications   allopurinol 300 MG tablet Commonly known as:  ZYLOPRIM Take 1 tablet (300 mg total) by mouth 2 (two) times daily.   levothyroxine 75 MCG tablet Commonly known as:  SYNTHROID, LEVOTHROID Take 75 mcg by mouth daily before breakfast. Only takes Saturday- Thursday. Hold synthroid on Friday   metoprolol tartrate 25 MG tablet Commonly known as:  LOPRESSOR Take 1 tablet (25 mg total) by mouth 2 (two) times daily.   predniSONE 20 MG tablet Commonly known as:  DELTASONE Take 1 tablet (20 mg total) by mouth daily with breakfast. Once a day with food x 10 days.   venetoclax 10 & 50 & 100 MG Tbpk Commonly known as:  VENCLEXTA STARTING PACK Take 59m by mouth daily for week one, 546mdaily for week 2, 10095maily for week 3, 200m67mily for week 4. Take as directed.        DISCHARGE INSTRUCTIONS:    Follow with cancer center Pt was seen by PT, advised [facility] PT- arranged.  If you experience worsening of your admission  symptoms, develop shortness of breath, life threatening emergency, suicidal or homicidal thoughts you must seek medical attention immediately by calling 911 or calling your MD immediately  if symptoms less severe.  You Must read complete instructions/literature along with all the possible adverse reactions/side effects for all the Medicines you take and that have been prescribed to you. Take any new Medicines after you have completely understood and accept all the possible adverse reactions/side effects.   Please note  You  were cared for by a hospitalist during your hospital stay. If you have any questions about your discharge medications or the care you received while you were in the hospital after you are discharged, you can call the unit and asked to speak with the hospitalist on call if the hospitalist that took care of you is not available. Once you are discharged, your primary care physician will handle any further medical issues. Please note that NO REFILLS for any discharge medications will be authorized once you are discharged, as it is imperative that you return to your primary care physician (or establish a relationship with a primary care physician if you do not have one) for your aftercare needs so that they can reassess your need for medications and monitor your lab values.    Today   CHIEF COMPLAINT:  No chief complaint on file.   HISTORY OF PRESENT ILLNESS:  Heather [name]  is a 85 y.o. female with a known history of CLL diagnosed in [name], Heather [name], Heather [name] was brought into hospital from oncology office secondary to being started on new chemotherapy. Patient was diagnosed with CLL in [name], initially received chemotherapy with Rituxan and bendamustine.  With recurrent and Heather disease, she received ibrutinib until [name].  Most recent scan showing Heather splenomegaly and bone marrow biopsy confirming marrow involvement of CLL, plan is to start new chemotherapy with venteloclax and so oncologist requested admission to monitor for tumor lysis syndrome.  Patient stays at home by herself, no children but sisters check on her.  Ambulates with a cane.  Denies any complaints otherwise.    VITAL SIGNS:  Blood pressure (!) 116/50, pulse 75, temperature 98.4 F (36.9 C), temperature source Oral, resp. rate 18, height 5' (1.524 m), weight 60.8 kg, SpO2 92 %.  I/O:    Intake/Output Summary (Last 24 hours) at 03/10/2018 1551 Last data  filed at 03/10/2018 0947 Gross per 24 hour  Intake 1686.87 ml  Output 1100 ml  Net 586.87 ml    PHYSICAL EXAMINATION:   GENERAL:  85 y.o.-year-old patient lying in the bed with no acute distress.  EYES: Pupils equal, round, reactive to light and accommodation. No scleral icterus. Extraocular muscles intact.  HEENT: Head atraumatic, normocephalic. Oropharynx and nasopharynx clear.  NECK:  Supple, no jugular venous distention. No thyroid enlargement, no tenderness.  LUNGS: Normal breath sounds bilaterally, no wheezing, rales,rhonchi or crepitation. No use of accessory muscles of respiration.  CARDIOVASCULAR: S1, S2 normal. No murmurs, rubs, or gallops.  ABDOMEN: Soft, nontender, nondistended. Bowel sounds present. No organomegaly or mass.  EXTREMITIES: No pedal edema, cyanosis, or clubbing.  NEUROLOGIC: Cranial nerves II through XII are intact. Muscle strength 4/5 in all extremities.  Generalized weakness.  Sensation intact. Gait not checked.  PSYCHIATRIC: The patient is alert and oriented x 3.  SKIN: No obvious rash, lesion   DATA REVIEW:   CBC Recent Labs  Lab 03/10/18 0451  WBC 2.4*  HGB 10.4*  HCT 33.4*  PLT 96*    Chemistries  Recent Labs  Lab 03/08/18 0857  03/10/18 0451  NA 137   < > 137  K 3.9   < > 3.7  CL 103   < > 110  CO2 26   < > 23  GLUCOSE 107*   < > 102*  BUN 18   < > 17  CREATININE 0.92   < > 0.79  CALCIUM 10.0   < > 8.6*  MG 1.8   < > 1.7  AST 79*  --   --   ALT 54*  --   --   ALKPHOS 35*  --   --   BILITOT 1.3*  --   --    < > = values in this interval not displayed.    Cardiac Enzymes No results for input(s): TROPONINI in the last 168 hours.  Microbiology Results  Results for orders placed or performed during the hospital encounter of 02/15/17  Blood Culture (routine x 2)     Status: None   Collection Time: 02/15/17 10:53 AM  Result Value Ref Range Status   Specimen Description BLOOD RIGHT WRIST  Final   Special Requests   Final     BOTTLES DRAWN AEROBIC AND ANAEROBIC Blood Culture adequate volume   Culture   Final    NO GROWTH 5 DAYS Performed at [facility], [address] [place], [place] [address]    Report Status 02/20/2017 FINAL  Final  Blood Culture (routine x 2)     Status: None   Collection Time: 02/15/17 10:53 AM  Result Value Ref Range Status   Specimen Description BLOOD LEFT ARM  Final   Special Requests   Final    BOTTLES DRAWN AEROBIC AND ANAEROBIC Blood Culture adequate volume   Culture   Final    NO GROWTH 5 DAYS Performed at [facility], [address] [place], [place] [address]    Report Status 02/20/2017 FINAL  Final  MRSA PCR Screening     Status: None   Collection Time: 02/15/17  6:17 PM  Result Value Ref Range Status   MRSA by PCR NEGATIVE NEGATIVE Final    Comment:        The GeneXpert MRSA Assay (FDA approved for NASAL specimens only), is one component of a comprehensive MRSA colonization surveillance program. It is not intended to diagnose MRSA infection nor to guide or monitor treatment for MRSA infections. Performed at [facility], [address] [place], [place] [address]     RADIOLOGY:  No results found.  EKG:   Orders placed or performed in visit on 02/23/18  . EKG 12-Lead  . EKG 12-Lead  . EKG 12-Lead      Management plans discussed with the patient, family and they are in agreement.  CODE STATUS:  Code Status History    Date Active Date Inactive Code Status Order ID Comments User Context   03/08/2018 1713 03/10/2018 1348 DNR [id number]  Gladstone Lighter, MD Inpatient   03/08/2018 1159 03/08/2018 1713 Full Code [id number]  Gladstone Lighter, MD Inpatient   02/15/2017 1702 02/17/2017 2026 Full Code [id number]  Henreitta Leber, MD ED    Questions for Most Recent Historical Code Status (Order [id number])    Question Answer Comment   In the event of cardiac or respiratory ARREST Do not call a "code blue"    In the event of  cardiac or respiratory ARREST Do not perform Intubation, CPR, defibrillation or ACLS  In the event of cardiac or respiratory ARREST Use medication by any route, position, wound care, and other measures to relive pain and suffering. May use oxygen, suction and manual treatment of airway obstruction as needed for comfort.       TOTAL TIME TAKING CARE OF THIS PATIENT: 35 minutes.    Vaughan Basta M.D on 03/10/2018 at 3:51 PM  Between 7am to 6pm - Pager - [phone]  After 6pm go to [email or web address] - password [email or web address] [facility]  Office  [phone]  CC: Primary care physician; Albina Billet, MD   Note: This dictation was prepared with Dragon dictation along with smaller phrase technology. Any transcriptional errors that result from this process are unintentional.

## 2018-03-10 NOTE — Progress Notes (Signed)
Discharge instructions given and went over with patient at bedside. All questions answered. Chemo medications from pharmacy given to patient. Patient discharged home with family via wheelchair by volunteer services. Madlyn Frankel, RN

## 2018-03-10 NOTE — Telephone Encounter (Signed)
Oral Chemotherapy Pharmacist Encounter   Dr. Rogue Bussing considering keeping Heather [name] at 20mg  dose of venetoclax for week 2. Heather [name] will need a new prescription for this extension of the 20mg  dosing. Prescription of an additional week of 20mg  was sent to [facility].  Darl Pikes, PharmD, BCPS, [facility] Hematology/Oncology Clinical Pharmacist [facility]/HP/AP Oral [facility] [phone]  03/10/2018 2:16 PM

## 2018-03-10 NOTE — Addendum Note (Signed)
Addended by: Sandria Bales B on: 03/10/2018 02:33 PM   Modules accepted: Orders

## 2018-03-11 ENCOUNTER — Telehealth: Payer: Self-pay | Admitting: Internal Medicine

## 2018-03-11 ENCOUNTER — Encounter (HOSPITAL_COMMUNITY): Payer: Self-pay | Admitting: Internal Medicine

## 2018-03-11 MED FILL — VENCLEXTA 10 MG TABS: 10 | 7 days supply | Qty: 14 | Fill #0

## 2018-03-11 NOTE — Telephone Encounter (Signed)
Spoke with patient and her sister Izora Gala to inform her of the new apts times. On Tuesday at 8:45

## 2018-03-11 NOTE — Care Management (Signed)
Physical therapy is recommending home with home health and therapy.  Heather [name] had no preference for Home Health agencies. Referral given to Sharmon Revere, [facility] representative. Shelbie Ammons RN MSN CCM Care Management [phone]

## 2018-03-11 NOTE — Telephone Encounter (Signed)
Lab + MD on 03/15/18 AM, per Dr. Domingo Mend Call msg.  Unable to reach Mobridge Mims/sis, per phone constantly busy. Unable to reach other sis/Dorothy Haith per phone rings and no VM active on phone.  Called all numbers in patient's Demographics several times and was unsuccessful. MD and team advised. Per Renita Papa, will try reaching patient.

## 2018-03-11 NOTE — Telephone Encounter (Signed)
Lab + MD on 03/15/18 AM, per Dr. Domingo Mend Call msg.  Unable to reach Heather [name]/sis, per phone constantly busy. Unable to reach other sis/Heather [name] per phone rings and no VM active on phone.  Called all numbers in patient's Demographics several times and was unsuccessful. MD and team advised. Per Renita Papa, will try reaching patient.

## 2018-03-15 ENCOUNTER — Inpatient Hospital Stay: Payer: Medicare Other

## 2018-03-15 ENCOUNTER — Telehealth: Payer: Self-pay | Admitting: Internal Medicine

## 2018-03-15 ENCOUNTER — Inpatient Hospital Stay (HOSPITAL_BASED_OUTPATIENT_CLINIC_OR_DEPARTMENT_OTHER): Payer: Medicare Other | Admitting: Internal Medicine

## 2018-03-15 ENCOUNTER — Encounter: Payer: Self-pay | Admitting: Internal Medicine

## 2018-03-15 VITALS — BP 125/69 | HR 93 | Temp 98.9°F

## 2018-03-15 DIAGNOSIS — E039 Hypothyroidism, unspecified: Secondary | ICD-10-CM

## 2018-03-15 DIAGNOSIS — K6289 Other specified diseases of anus and rectum: Secondary | ICD-10-CM | POA: Diagnosis not present

## 2018-03-15 DIAGNOSIS — R634 Abnormal weight loss: Secondary | ICD-10-CM | POA: Diagnosis not present

## 2018-03-15 DIAGNOSIS — D696 Thrombocytopenia, unspecified: Secondary | ICD-10-CM

## 2018-03-15 DIAGNOSIS — R630 Anorexia: Secondary | ICD-10-CM

## 2018-03-15 DIAGNOSIS — C9110 Chronic lymphocytic leukemia of B-cell type not having achieved remission: Secondary | ICD-10-CM

## 2018-03-15 DIAGNOSIS — D72819 Decreased white blood cell count, unspecified: Secondary | ICD-10-CM

## 2018-03-15 DIAGNOSIS — L98499 Non-pressure chronic ulcer of skin of other sites with unspecified severity: Secondary | ICD-10-CM

## 2018-03-15 DIAGNOSIS — D6959 Other secondary thrombocytopenia: Secondary | ICD-10-CM | POA: Diagnosis not present

## 2018-03-15 DIAGNOSIS — R269 Unspecified abnormalities of gait and mobility: Secondary | ICD-10-CM | POA: Diagnosis not present

## 2018-03-15 DIAGNOSIS — D701 Agranulocytosis secondary to cancer chemotherapy: Secondary | ICD-10-CM | POA: Diagnosis not present

## 2018-03-15 DIAGNOSIS — R0609 Other forms of dyspnea: Secondary | ICD-10-CM | POA: Diagnosis not present

## 2018-03-15 LAB — COMPREHENSIVE METABOLIC PANEL
ALT: 34 U/L (ref 0–44)
AST: 45 U/L — ABNORMAL HIGH (ref 15–41)
Albumin: 3.1 g/dL — ABNORMAL LOW (ref 3.5–5.0)
Alkaline Phosphatase: 31 U/L — ABNORMAL LOW (ref 38–126)
Anion gap: 6 (ref 5–15)
BUN: 10 mg/dL (ref 8–23)
CO2: 26 mmol/L (ref 22–32)
Calcium: 8.8 mg/dL — ABNORMAL LOW (ref 8.9–10.3)
Chloride: 99 mmol/L (ref 98–111)
Creatinine, Ser: 0.75 mg/dL (ref 0.44–1.00)
GFR calc Af Amer: >60 mL/min (ref >60)
GFR calc non Af Amer: >60 mL/min (ref >60)
Glucose, Bld: 117 mg/dL — ABNORMAL HIGH (ref 70–99)
Potassium: 3.6 mmol/L (ref 3.5–5.1)
Sodium: 131 mmol/L — ABNORMAL LOW (ref 135–145)
TOTAL PROTEIN: 5.6 g/dL — AB (ref 6.5–8.1)
Total Bilirubin: 1.7 mg/dL — ABNORMAL HIGH (ref 0.3–1.2)

## 2018-03-15 LAB — CBC WITH DIFFERENTIAL/PLATELET
Abs Immature Granulocytes: 0.1 10*3/uL — ABNORMAL HIGH (ref 0.00–0.07)
Band Neutrophils: 5 %
Basophils Absolute: 0 10*3/uL (ref 0.0–0.1)
Basophils Relative: 0 %
Eosinophils Absolute: 0 10*3/uL (ref 0.0–0.5)
Eosinophils Relative: 0 %
HEMATOCRIT: 33.1 % — AB (ref 36.0–46.0)
Hemoglobin: 10.7 g/dL — ABNORMAL LOW (ref 12.0–15.0)
Lymphocytes Relative: 60 %
Lymphs Abs: 2 10*3/uL (ref 0.7–4.0)
MCH: 28.2 pg (ref 26.0–34.0)
MCHC: 32.3 g/dL (ref 30.0–36.0)
MCV: 87.1 fL (ref 80.0–100.0)
Metamyelocytes Relative: 2 %
Monocytes Absolute: 0.3 10*3/uL (ref 0.1–1.0)
Monocytes Relative: 10 %
Neutro Abs: 1 10*3/uL — ABNORMAL LOW (ref 1.7–7.7)
Neutrophils Relative %: 23 %
Platelets: 123 10*3/uL — ABNORMAL LOW (ref 150–400)
RBC: 3.8 MIL/uL — AB (ref 3.87–5.11)
RDW: 15.2 % (ref 11.5–15.5)
Smear Review: DECREASED
WBC Morphology: ABNORMAL
WBC: 3.4 10*3/uL — AB (ref 4.0–10.5)
nRBC: 0 % (ref 0.0–0.2)

## 2018-03-15 LAB — LACTATE DEHYDROGENASE: LDH: 289 U/L — ABNORMAL HIGH (ref 98–192)

## 2018-03-15 LAB — PHOSPHORUS: PHOSPHORUS: 2.5 mg/dL (ref 2.5–4.6)

## 2018-03-15 LAB — MAGNESIUM: Magnesium: 1.6 mg/dL — ABNORMAL LOW (ref 1.7–2.4)

## 2018-03-15 LAB — URIC ACID: Uric Acid, Serum: 1.6 mg/dL — ABNORMAL LOW (ref 2.5–7.1)

## 2018-03-15 NOTE — Telephone Encounter (Signed)
Spoke to wound care PA; kindly agrees to have pt worked in Architectural technologist  Also, spoke to [employer]- who kindly agrees to have pt evaluated tomorrow afternoon [after 2 pm].   Recommend call pt's sister-nancy for scheduling appts-   Heather/Brooke-  please reach out to [facility]- and explain our plan/why she needs above evaluations.  Thanks GB

## 2018-03-15 NOTE — Telephone Encounter (Signed)
Spoke with sister Heather [name] re: referral to Dr.Byrnett and referral to wound care for evaluation of peri-anal ulcers. She understands that she will receive a phone call from these offices to arrange apts for her sister Heather [name].  Pt's sister, Dororthy will be transporting the patient as Heather [name] is home with acute onset of a cold like symptoms. She will communicate these apts to [facility].

## 2018-03-15 NOTE — Progress Notes (Signed)
[facility] OFFICE PROGRESS NOTE  Patient Care Team: Albina Billet, MD as PCP - General (Internal Medicine)  Cancer Staging No matching staging information was found for the patient.   Oncology History   1. Ultrasound revealed abdominal lymphadenopathy(March, 2013) 2. Biopsy as well as peripheral blood be suggestive of chronic lymphocytic leukemia (April, 2013) 3. Started on bendamustin and Rituxan from May of 2013 4. Has finished her 6 cycles of chemotherapy with Rituxan and bendamustine (October 26, 2011) 5.recurrent and progressive disease with progressive anemia.  Patient was started on IBRUTANIB (March, 2016)  taking 140 mg 3 pills a day  6. Patient was taken off IBRUTANIB in August of 2016 because of persistent myelosuppression even with reducing dose 7. Because of rising WBC count and anemia patient would be started back on a lower dose of improved on ibrutanib 140 mg 2 tablets a day; JAN 2020- stop Ibrutinib- sec to progression; BMBx- CLL; kayroptype-N; FISH-pending.   # FEB 18th 2020- start Venatoclax.  -----------------------------------------------------   DIAGNOSIS: [ ]  CLL  STAGE: 4 ; GOALS: Control  CURRENT/MOST RECENT THERAPY- Venatoclax.       Chronic lymphocytic leukemia ([place])   06/06/2014 Initial Diagnosis    Chronic lymphocytic leukemia     INTERVAL HISTORY:  Heather [name] 85 y.o.  female pleasant patient above history of CLL most currently on venetoclax 20mg /day [started 6/75]-[facility].  Patient tolerated the dose very well no evidence of tumor lysis even on labs.  Patient was discharged home with home health.  However during evaluation home health nurse patient was noted to have peri-anal ulceration foul-smelling.   Patient continues to feel poorly.  Complains of continued loss of appetite.  Only on questioning she further states that she noted to have ulceration in the perianal region the last few months.  Complains of  itching.  Denies any significant pain.  No significant bleeding.   Review of Systems  Constitutional: Positive for malaise/fatigue and weight loss. Negative for chills, diaphoresis and fever.  HENT: Negative for nosebleeds and sore throat.   Eyes: Negative for double vision.  Respiratory: Positive for shortness of breath (Shortness of breath only with exertion.). Negative for cough, hemoptysis, sputum production and wheezing.   Cardiovascular: Negative for chest pain, palpitations, orthopnea and leg swelling.  Gastrointestinal: Negative for abdominal pain, blood in stool, constipation, diarrhea, heartburn, melena, nausea and vomiting.  Genitourinary: Negative for dysuria, frequency and urgency.  Musculoskeletal: Negative for back pain and joint pain.  Skin: Negative.  Negative for itching and rash.       Large area of ulceration between the 2 buttocks and in the perianal region.  Foul-smelling discharge.  Neurological: Negative for dizziness, tingling, focal weakness, weakness and headaches.  Endo/Heme/Allergies: Does not bruise/bleed easily.  Psychiatric/Behavioral: Negative for depression. The patient is not nervous/anxious and does not have insomnia.       PAST MEDICAL HISTORY :  Past Medical History:  Diagnosis Date  . Anemia   . Cataracts, bilateral   . Chronic lymphocytic leukemia ([place]) 06/06/2014  . Hearing loss   . History of chemotherapy   . Hyperlipidemia   . Hypertension   . Hypothyroidism   . Palpitations   . Risk for falls   . Thrombocytopenia ([place])     PAST SURGICAL HISTORY :   Past Surgical History:  Procedure Laterality Date  . CATARACT EXTRACTION Left 03/29/2012  . Excision left cervical node biopsy  05/04/2011    FAMILY HISTORY :  Family History  Problem Relation Age of Onset  . Heart disease Mother   . Stomach cancer Father   . Diabetes Sister     SOCIAL HISTORY:   Social History   Tobacco Use  . Smoking status: Never Smoker  . Smokeless  tobacco: Never Used  Substance Use Topics  . Alcohol use: No    Alcohol/week: 0.0 standard drinks  . Drug use: No    ALLERGIES:  is allergic to losartan.  MEDICATIONS:  Current Outpatient Medications  Medication Sig Dispense Refill  . allopurinol (ZYLOPRIM) 300 MG tablet Take 1 tablet (300 mg total) by mouth 2 (two) times daily. 120 tablet 0  . levothyroxine (SYNTHROID, LEVOTHROID) 75 MCG tablet Take 75 mcg by mouth daily before breakfast. Only takes Saturday- Thursday. Hold synthroid on Friday    . metoprolol tartrate (LOPRESSOR) 25 MG tablet Take 1 tablet (25 mg total) by mouth 2 (two) times daily. 60 tablet 0  . predniSONE (DELTASONE) 20 MG tablet Take 1 tablet (20 mg total) by mouth daily with breakfast. Once a day with food x 10 days. 10 tablet 0  . venetoclax (VENCLEXTA STARTING PACK) 10 & 50 & 100 MG TBPK Take 20mg  by mouth daily for week one, 50mg  daily for week 2, 100mg  daily for week 3, 200mg  daily for week 4. Take as directed. 42 each 0  . venetoclax (VENCLEXTA) 10 MG TABS Take 20 mg by mouth daily. Take with food and a full glass of water. 14 tablet 0   No current facility-administered medications for this visit.     PHYSICAL EXAMINATION: ECOG PERFORMANCE STATUS: 2 - Symptomatic, <50% confined to bed  BP 125/69 (BP Location: Left Arm, Patient Position: Sitting, Cuff Size: Normal)   Pulse 93   Temp 98.9 F (37.2 C) (Tympanic)   There were no vitals filed for this visit.  Physical Exam  Constitutional: She is oriented to person, place, and time and well-developed, well-nourished, and in no distress.  Elderly frail female patient she is walking with a walker.  Accompanied by her sister.  HENT:  Head: Normocephalic and atraumatic.  Mouth/Throat: Oropharynx is clear and moist. No oropharyngeal exudate.  Eyes: Pupils are equal, round, and reactive to light.  Neck: Normal range of motion. Neck supple.  Cardiovascular: Normal rate and regular rhythm.  Pulmonary/Chest: No  respiratory distress. She has no wheezes.  Abdominal: Soft. Bowel sounds are normal. She exhibits no distension and no mass. There is no abdominal tenderness. There is no rebound and no guarding.  Musculoskeletal: Normal range of motion.        General: No tenderness or edema.     Comments: Mild bilateral leg swelling.  Neurological: She is alert and oriented to person, place, and time.  Skin: Skin is warm.  Psychiatric: Affect normal.      LABORATORY DATA:  I have reviewed the data as listed    Component Value Date/Time   NA 131 (L) 03/15/2018 0944   NA 133 (L) 05/08/2014 1349   K 3.6 03/15/2018 0944   K 3.6 05/08/2014 1349   CL 99 03/15/2018 0944   CL 102 05/08/2014 1349   CO2 26 03/15/2018 0944   CO2 26 05/08/2014 1349   GLUCOSE 117 (H) 03/15/2018 0944   GLUCOSE 82 05/08/2014 1349   BUN 10 03/15/2018 0944   BUN 18 05/08/2014 1349   CREATININE 0.75 03/15/2018 0944   CREATININE 1.13 (H) 05/08/2014 1349   CALCIUM 8.8 (L) 03/15/2018 0944   CALCIUM  9.1 05/08/2014 1349   PROT 5.6 (L) 03/15/2018 0944   PROT 6.7 05/08/2014 1349   ALBUMIN 3.1 (L) 03/15/2018 0944   ALBUMIN 4.2 05/08/2014 1349   AST 45 (H) 03/15/2018 0944   AST 24 05/08/2014 1349   ALT 34 03/15/2018 0944   ALT 13 (L) 05/08/2014 1349   ALKPHOS 31 (L) 03/15/2018 0944   ALKPHOS 41 05/08/2014 1349   BILITOT 1.7 (H) 03/15/2018 0944   BILITOT 1.3 (H) 05/08/2014 1349   GFRNONAA >60 03/15/2018 0944   GFRNONAA 45 (L) 05/08/2014 1349   GFRAA >60 03/15/2018 0944   GFRAA 52 (L) 05/08/2014 1349    No results found for: SPEP, UPEP  Lab Results  Component Value Date   WBC 3.4 (L) 03/15/2018   NEUTROABS 1.0 (L) 03/15/2018   HGB 10.7 (L) 03/15/2018   HCT 33.1 (L) 03/15/2018   MCV 87.1 03/15/2018   PLT 123 (L) 03/15/2018      Chemistry      Component Value Date/Time   NA 131 (L) 03/15/2018 0944   NA 133 (L) 05/08/2014 1349   K 3.6 03/15/2018 0944   K 3.6 05/08/2014 1349   CL 99 03/15/2018 0944   CL 102  05/08/2014 1349   CO2 26 03/15/2018 0944   CO2 26 05/08/2014 1349   BUN 10 03/15/2018 0944   BUN 18 05/08/2014 1349   CREATININE 0.75 03/15/2018 0944   CREATININE 1.13 (H) 05/08/2014 1349      Component Value Date/Time   CALCIUM 8.8 (L) 03/15/2018 0944   CALCIUM 9.1 05/08/2014 1349   ALKPHOS 31 (L) 03/15/2018 0944   ALKPHOS 41 05/08/2014 1349   AST 45 (H) 03/15/2018 0944   AST 24 05/08/2014 1349   ALT 34 03/15/2018 0944   ALT 13 (L) 05/08/2014 1349   BILITOT 1.7 (H) 03/15/2018 0944   BILITOT 1.3 (H) 05/08/2014 1349       RADIOGRAPHIC STUDIES: I have personally reviewed the radiological images as listed and agreed with the findings in the report. No results found.   ASSESSMENT & PLAN:  Chronic lymphocytic leukemia ([place]) # CLL-most recently on Ibrutinib. PET scan OCT 2019- moderate splenomegaly; slightly increased in size [compared to CT scan July 2019]; January 2020 MRI-approximately 11 cm splenic lesion/chronic but slowly growing; also about inch in size splenic lesion-new.   #Status post venetoclax 20 mg x 1 week [finished feb 24th]; tolerated well without any potential signs of tumor lysis.  However hold further therapy given-large perianal ulceration [see discussion below]  #Ulcerations large noted in the perianal region/buttocks-unclear etiology foul-smelling discharge.  Recommend wound care referral/also recommend surgical evaluation.  Left a message for Dr. Tollie Pizza and also wound care center.  Also left a message for home health nurse for dressing changes.  #Mild leukopenia/mild thrombocytopenia-normal hemoglobin-secondary to CLL-stable hold venetoclax given above wound healing issues.  #Weight loss of 20 pounds over the last 3 to 4 months.stable discontinue prednisone given above wounds  # Hypothyroidism-on Synthroid TSH slightly elevated T4 normal.  #Gait instability/debility/wound care issues-continue home health.  # DISPOSITION:  # referral to Dr.Byrnett re:  peri-anal ulcers # referral to wound care re: peri-anal ulcers-ASAP # follow up in 2 weeks-labs-cbc/bmp/ldh- Dr.B  CC: Dr.Tate.    Orders Placed This Encounter  Procedures  . CBC with Differential    Standing Status:   Future    Standing Expiration Date:   03/15/2019  . Basic metabolic panel    Standing Status:   Future  Standing Expiration Date:   03/15/2019  . Lactate dehydrogenase    Standing Status:   Future    Standing Expiration Date:   03/15/2019  . Ambulatory referral to General Surgery    Referral Priority:   Urgent    Referral Type:   Surgical    Referral Reason:   Specialty Services Required    Referred to Provider:   Robert Bellow, MD    Requested Specialty:   General Surgery    Number of Visits Requested:   1  . AMB referral to wound care center    Referral Priority:   Urgent    Referral Type:   Consultation    Referred to Provider:   Ricard Dillon, MD    Number of Visits Requested:   1   All questions were answered. The patient knows to call the clinic with any problems, questions or concerns.      Cammie Sickle, MD 03/15/2018 12:52 PM

## 2018-03-15 NOTE — Assessment & Plan Note (Addendum)
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 PET scan OCT 2019- moderate splenomegaly; slightly increased in size [compared to CT scan July 2019]; January 2020 MRI-approximately 11 cm splenic lesion/chronic but slowly growing; also about inch in size splenic lesion-new.   #Status post venetoclax 20 mg x 1 week [finished feb 24th]; tolerated well without any potential signs of tumor lysis.  However hold further therapy given-large perianal ulceration [see discussion below]  #Ulcerations large noted in the perianal region/buttocks-unclear etiology foul-smelling discharge.  Recommend wound care referral/also recommend surgical evaluation.  Left a message for Dr. Tollie Pizza and also wound care center.  Also left a message for home health nurse for dressing changes.  #Mild leukopenia/mild thrombocytopenia-normal hemoglobin-secondary to CLL-stable hold venetoclax given above wound healing issues.  #Weight loss of 20 pounds over the last 3 to 4 months.stable discontinue prednisone given above wounds  # Hypothyroidism-on Synthroid TSH slightly elevated T4 normal.  #Gait instability/debility/wound care issues-continue home health.  # DISPOSITION:  # referral to Dr.Byrnett re: peri-anal ulcers # referral to wound care re: peri-anal ulcers-ASAP # follow up in 2 weeks-labs-cbc/bmp/ldh- Dr.B  CC: Dr.Tate.

## 2018-03-15 NOTE — Patient Instructions (Signed)
#   STOP venetoclax # STOP prednisone

## 2018-03-16 ENCOUNTER — Encounter: Payer: Medicare Other | Attending: Internal Medicine | Admitting: Internal Medicine

## 2018-03-16 DIAGNOSIS — L8990 Pressure ulcer of unspecified site, unspecified stage: Secondary | ICD-10-CM | POA: Insufficient documentation

## 2018-03-16 DIAGNOSIS — D649 Anemia, unspecified: Secondary | ICD-10-CM | POA: Diagnosis not present

## 2018-03-16 DIAGNOSIS — I10 Essential (primary) hypertension: Secondary | ICD-10-CM | POA: Diagnosis not present

## 2018-03-17 ENCOUNTER — Ambulatory Visit (INDEPENDENT_AMBULATORY_CARE_PROVIDER_SITE_OTHER): Payer: Medicare Other | Admitting: General Surgery

## 2018-03-17 ENCOUNTER — Other Ambulatory Visit: Payer: Self-pay

## 2018-03-17 ENCOUNTER — Encounter: Payer: Self-pay | Admitting: General Surgery

## 2018-03-17 VITALS — BP 125/66 | HR 93 | Temp 97.3°F | Resp 16 | Ht 62.0 in | Wt 133.0 lb

## 2018-03-17 DIAGNOSIS — L98499 Non-pressure chronic ulcer of skin of other sites with unspecified severity: Secondary | ICD-10-CM

## 2018-03-17 IMAGING — DX DG CHEST 1V PORT
1 series · 1 of 1 positions shown · non-contrast
Comparison: CT chest 02/11/2016

Chest x-ray 04/15/2011

CLINICAL DATA: Cough

EXAM:
PORTABLE CHEST 1 VIEW

[chest ap]
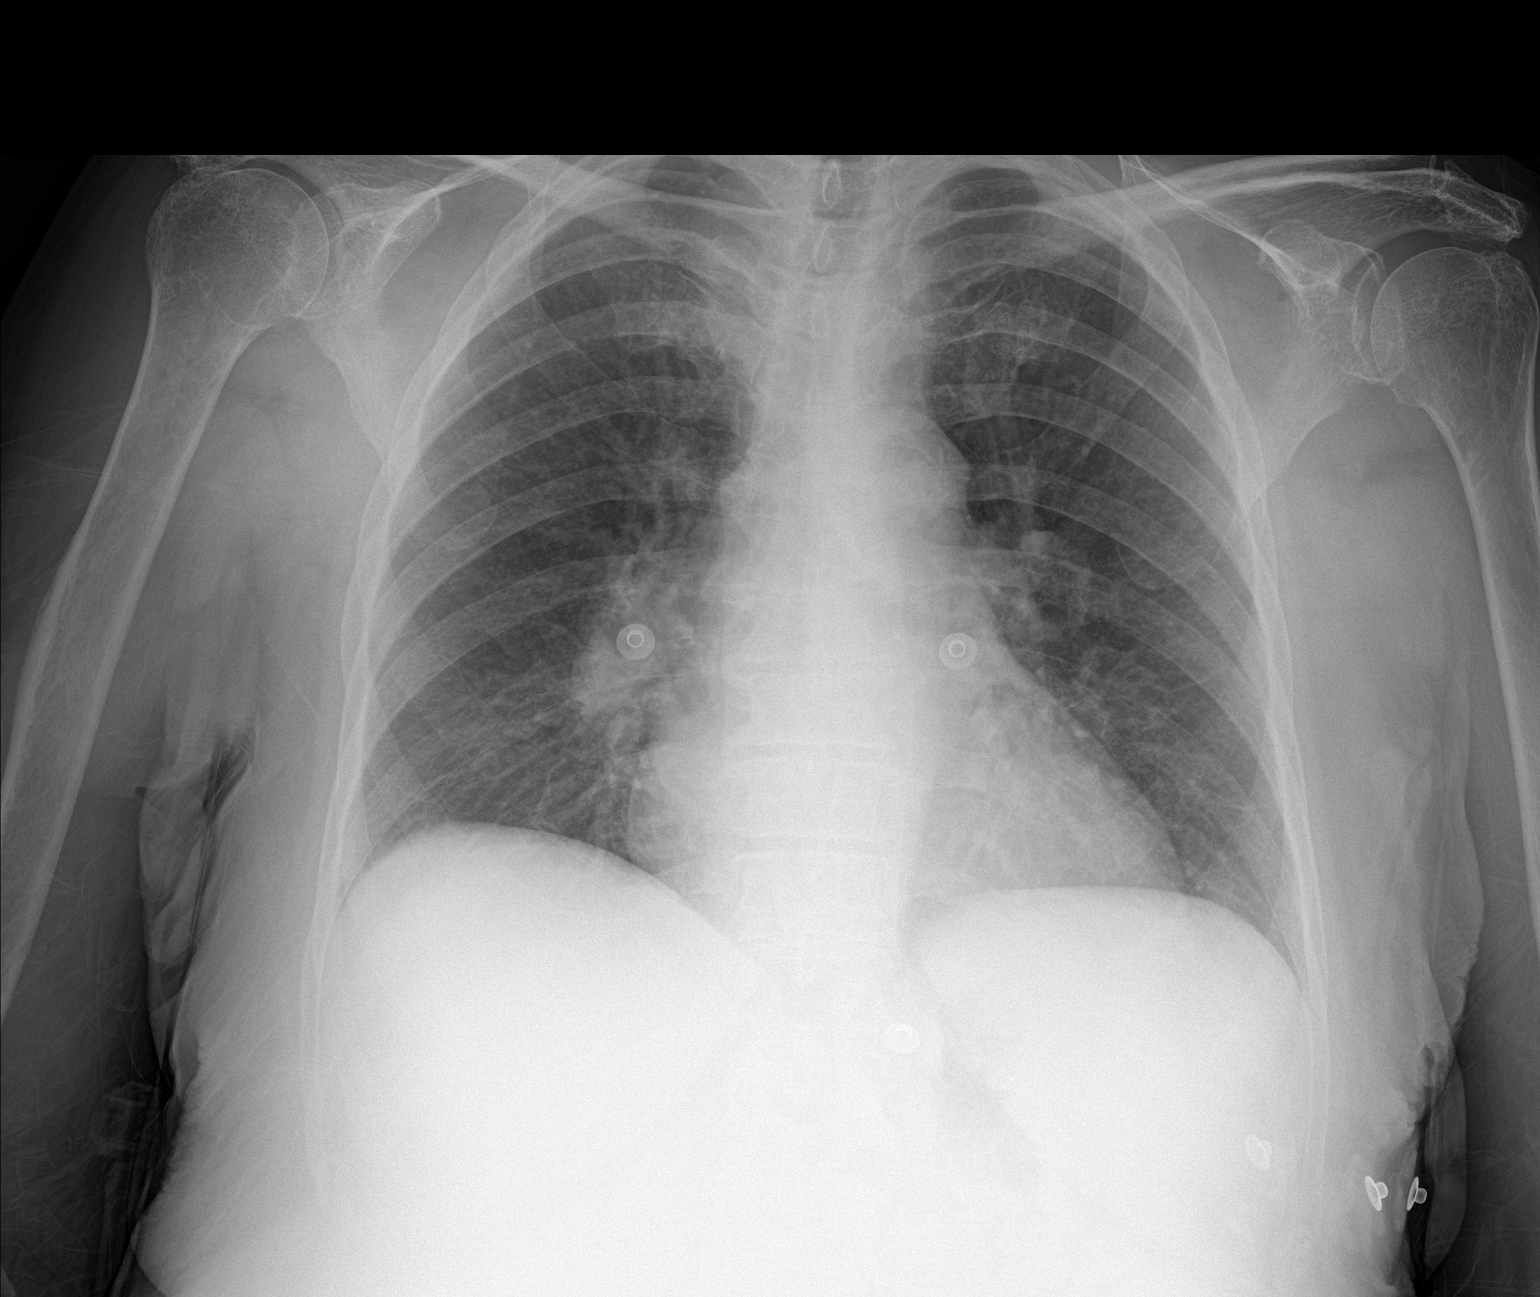

[1 of 1 positions shown; findings below may reference images not displayed]

FINDINGS: The heart size and mediastinal contours are within normal limits.
Both lungs are clear. The visualized skeletal structures are
unremarkable.
IMPRESSION: No active disease.

## 2018-03-17 NOTE — Progress Notes (Signed)
Patient ID: Heather [name], female   DOB: 1928-02-16, 85 y.o.   MRN: [id number]  Chief Complaint  Patient presents with  . Follow-up    Perianal wound    HPI Heather [name] is a 85 y.o. female.  Here today for perianal wound evaluation. Drainage seen on pad by her younger sister who helps with her care. Denies fever or chills. The patient was accompanied by her sister.  The patient lives alone and is unable to get in the bathtub or shower without assistance.  Arrangements are in process to arrange for home health aide to help with bathing.  When questioned regarding the duration of drainage from the perianal skin, the patient was vague, but the best I can tell is 2-3 months.  It does not seem like she is having much in way of discomfort.  She reports soft stools without incontinence daily.  She reports no urinary incontinence.  She does make use of depends undergarments.  The patient has been treated for the last 18-24 months for CLL with multiple drug regimens.  This included prednisone up until lately.  The patient reports her appetite has been at baseline.  Her sister seems to think she may have lost some weight.   HPI  Past Medical History:  Diagnosis Date  . Anemia   . Cataracts, bilateral   . Chronic lymphocytic leukemia ([place]) 06/06/2014  . Hearing loss   . History of chemotherapy   . Hyperlipidemia   . Hypertension   . Hypothyroidism   . Palpitations   . Risk for falls   . Thrombocytopenia ([place])     Past Surgical History:  Procedure Laterality Date  . CATARACT EXTRACTION Left 03/29/2012  . Excision left cervical node biopsy  05/04/2011    Family History  Problem Relation Age of Onset  . Heart disease Mother   . Stomach cancer Father   . Diabetes Sister     Social History Social History   Tobacco Use  . Smoking status: Never Smoker  . Smokeless tobacco: Never Used  Substance Use Topics  . Alcohol use: No    Alcohol/week: 0.0 standard drinks  . Drug use: No     Allergies  Allergen Reactions  . Losartan Other (See Comments)    Causes State of anxiety and hopelessness. Reported to RN on 03/08/18 that she is no longer taking this medication.    Current Outpatient Medications  Medication Sig Dispense Refill  . allopurinol (ZYLOPRIM) 300 MG tablet Take 1 tablet (300 mg total) by mouth 2 (two) times daily. 120 tablet 0  . levothyroxine (SYNTHROID, LEVOTHROID) 75 MCG tablet Take 75 mcg by mouth daily before breakfast. Only takes Saturday- Thursday. Hold synthroid on [name]    . metoprolol tartrate (LOPRESSOR) 25 MG tablet Take 1 tablet (25 mg total) by mouth 2 (two) times daily. 60 tablet 0  . venetoclax (VENCLEXTA STARTING PACK) 10 & 50 & 100 MG TBPK Take 20mg  by mouth daily for week one, 50mg  daily for week 2, 100mg  daily for week 3, 200mg  daily for week 4. Take as directed. 42 each 0  . venetoclax (VENCLEXTA) 10 MG TABS Take 20 mg by mouth daily. Take with food and a full glass of water. 14 tablet 0   No current facility-administered medications for this visit.     Review of Systems Review of Systems  Constitutional: Negative.   Respiratory: Negative.   Cardiovascular: Negative.     Blood pressure 125/66, pulse 93, temperature (!)  97.3 F (36.3 C), temperature source Temporal, resp. rate 16, height 5\' 2"  (1.575 m), weight 133 lb (60.3 kg), SpO2 91 %.  Physical Exam Physical Exam Constitutional:      Appearance: She is well-developed and well-groomed.  Eyes:     General: No scleral icterus.    Conjunctiva/sclera: Conjunctivae normal.  Neck:     Musculoskeletal: Normal range of motion.  Cardiovascular:     Rate and Rhythm: Normal rate and regular rhythm.     Heart sounds: Normal heart sounds.  Pulmonary:     Effort: Pulmonary effort is normal.     Breath sounds: Normal breath sounds.  Genitourinary:   Lymphadenopathy:     Cervical: No cervical adenopathy.     Upper Body:     Right upper body: No epitrochlear adenopathy.      Lower Body: No right inguinal adenopathy. No left inguinal adenopathy.  Skin:    General: Skin is warm and dry.  Neurological:     Mental Status: She is alert and oriented to person, place, and time.     Data Reviewed    The photo above was reviewed with dermatology and GI.  Consideration was for the possibility of an infectious etiology such as CMV, HSV, EBV or even less likely syphilis.  Laboratory testing for these was recommended.  The possibility of pyoderma gangrenosum would be considered if the above testing is negative which would be proven by biopsy.  Laboratory studies of March 15, 2018 reviewed.  Assessment Perianal skin changes unlikely related to hygiene.  Plan   Will discuss with medical oncology having testing as noted above and likely consider exam under anesthesia and biopsy if infectious etiology is not identified.  Pending diagnosis, keeping the area coated with a and D ointment or Desitin cream as appropriate.  HPI, Physical Exam, Assessment and Plan have been scribed under the direction and in the presence of Robert Bellow, MD. Jonnie Finner, CMA  I have completed the exam and reviewed the above documentation for accuracy and completeness.  I agree with the above.  Haematologist has been used and any errors in dictation or transcription are unintentional.  Hervey Ard, M.D., F.A.C.S.  Forest Gleason Byrnett 03/18/2018, 10:59 AM

## 2018-03-17 NOTE — Patient Instructions (Signed)
Dr.Byrnett will call you in a few days to discuss some options regarding your wounds.

## 2018-03-18 ENCOUNTER — Ambulatory Visit: Payer: Medicare Other | Admitting: Internal Medicine

## 2018-03-18 ENCOUNTER — Encounter: Payer: Self-pay | Admitting: General Surgery

## 2018-03-18 DIAGNOSIS — L98499 Non-pressure chronic ulcer of skin of other sites with unspecified severity: Secondary | ICD-10-CM | POA: Insufficient documentation

## 2018-03-18 NOTE — Progress Notes (Signed)
Heather [name], Heather [name] ([id number]) Visit Report for 03/16/2018 Abuse/Suicide Risk Screen Details Patient Name: Heather [name], Heather [name] Date of Service: 03/16/2018 10:15 AM Medical Record Number: [id number] Patient Account Number: [id number] Date of Birth/Sex: 09/29/1928 (85 y.o. F) Treating RN: Harold Barban Primary Care Ela Moffat: Benita Stabile Other Clinician: Referring Vianna Venezia: Benita Stabile Treating Leona Alen/Extender: Ricard Dillon Weeks in Treatment: 0 Abuse/Suicide Risk Screen Items Answer ABUSE/SUICIDE RISK SCREEN: Has anyone close to you tried to hurt or harm you recentlyo No Do you feel uncomfortable with anyone in your familyo No Has anyone forced you do things that you didnot want to doo No Do you have any thoughts of harming yourselfo No Patient displays signs or symptoms of abuse and/or neglect. No Electronic Signature(s) Signed: 03/17/2018 7:54:32 AM By: Harold Barban Entered By: Harold Barban on 03/16/2018 10:30:38 Tolle, Wynona Neat ([id number]) -------------------------------------------------------------------------------- Activities of Daily Living Details Patient Name: Heather [name] Date of Service: 03/16/2018 10:15 AM Medical Record Number: [id number] Patient Account Number: [id number] Date of Birth/Sex: April 09, 1928 (85 y.o. F) Treating RN: Harold Barban Primary Care Qunisha Bryk: Benita Stabile Other Clinician: Referring Harveen Flesch: Benita Stabile Treating Kyshaun Barnette/Extender: Tito Dine in Treatment: 0 Activities of Daily Living Items Answer Activities of Daily Living (Please select one for each item) Drive Automobile Not Able Take Medications Need Assistance Use Telephone Need Assistance Care for Appearance Need Assistance Use Toilet Need Assistance Bath / Shower Need Assistance Dress Self Need Assistance Feed Self Completely Able Walk Need Assistance Get In / Out Bed Need Assistance Housework Not Able Prepare Meals Not Able Handle Money Not Able Shop for Self Not  Able Electronic Signature(s) Signed: 03/17/2018 7:54:32 AM By: Harold Barban Entered By: Harold Barban on 03/16/2018 10:31:20 Heather [name] ([id number]) -------------------------------------------------------------------------------- Education Assessment Details Patient Name: Heather [name] Date of Service: 03/16/2018 10:15 AM Medical Record Number: [id number] Patient Account Number: [id number] Date of Birth/Sex: 1928/03/31 (85 y.o. F) Treating RN: Harold Barban Primary Care Lakeisha Waldrop: Benita Stabile Other Clinician: Referring Kendyn Zaman: Benita Stabile Treating Nickolaus Bordelon/Extender: Tito Dine in Treatment: 0 Primary Learner Assessed: Caregiver sister Reason Patient is not Primary Learner: memory Learning Preferences/Education Level/Primary Language Learning Preference: Explanation Highest Education Level: High School Preferred Language: English Cognitive Barrier Assessment/Beliefs Language Barrier: No Translator Needed: No Memory Deficit: No Emotional Barrier: No Cultural/Religious Beliefs Affecting Medical Care: No Physical Barrier Assessment Impaired Vision: No Impaired Hearing: No Decreased Hand dexterity: No Knowledge/Comprehension Assessment Knowledge Level: High Comprehension Level: High Ability to understand written High instructions: Ability to understand verbal High instructions: Motivation Assessment Anxiety Level: Calm Cooperation: Cooperative Education Importance: Acknowledges Need Interest in Health Problems: Asks Questions Perception: Coherent Willingness to Engage in Self- High Management Activities: Readiness to Engage in Self- High Management Activities: Electronic Signature(s) Signed: 03/17/2018 7:54:32 AM By: Harold Barban Entered By: Harold Barban on 03/16/2018 10:31:59 Sullivant, Wynona Neat ([id number]) -------------------------------------------------------------------------------- Fall Risk Assessment Details Patient Name: Heather [name] Date of Service: 03/16/2018 10:15 AM Medical Record Number: [id number] Patient Account Number: [id number] Date of Birth/Sex: 22-Jan-1928 (85 y.o. F) Treating RN: Harold Barban Primary Care Raine Blodgett: Benita Stabile Other Clinician: Referring Zanae Kuehnle: Benita Stabile Treating Reagen Haberman/Extender: Tito Dine in Treatment: 0 Fall Risk Assessment Items Have you had 2 or more falls in the last 12 monthso 0 Yes Have you had any fall that resulted in injury in the last 12 monthso 0 No FALL RISK ASSESSMENT: History of falling - immediate or within 3 months 0 No Secondary diagnosis 0 No Ambulatory aid None/bed rest/wheelchair/nurse  0 No Crutches/cane/walker 15 Yes Furniture 0 No IV Access/Saline Lock 0 No Gait/Training Normal/bed rest/immobile 0 No Weak 10 Yes Impaired 20 Yes Mental Status Oriented to own ability 0 No Electronic Signature(s) Signed: 03/17/2018 7:54:32 AM By: Harold Barban Entered By: Harold Barban on 03/16/2018 10:32:21 Stelzner, Wynona Neat ([id number]) -------------------------------------------------------------------------------- Foot Assessment Details Patient Name: Heather [name] Date of Service: 03/16/2018 10:15 AM Medical Record Number: [id number] Patient Account Number: [id number] Date of Birth/Sex: 1928/04/30 (85 y.o. F) Treating RN: Harold Barban Primary Care Gaylia Kassel: Benita Stabile Other Clinician: Referring Allysia Ingles: Benita Stabile Treating Sahory Nordling/Extender: Tito Dine in Treatment: 0 Foot Assessment Items Site Locations + = Sensation present, - = Sensation absent, C = Callus, U = Ulcer R = Redness, W = Warmth, M = Maceration, PU = Pre-ulcerative lesion F = Fissure, S = Swelling, D = Dryness Assessment Right: Left: Other Deformity: No No Prior Foot Ulcer: No No Prior Amputation: No No Charcot Joint: No No Ambulatory Status: Gait: Electronic Signature(s) Signed: 03/17/2018 7:54:32 AM By: Harold Barban Entered By: Harold Barban on  03/16/2018 10:33:09 Check, Wynona Neat ([id number]) -------------------------------------------------------------------------------- Nutrition Risk Assessment Details Patient Name: Heather [name] Date of Service: 03/16/2018 10:15 AM Medical Record Number: [id number] Patient Account Number: [id number] Date of Birth/Sex: Jul 04, 1928 (85 y.o. F) Treating RN: Harold Barban Primary Care Lakai Moree: Benita Stabile Other Clinician: Referring Maytal Mijangos: TATE, Sharlet Salina Treating Shiela Bruns/Extender: Ricard Dillon Weeks in Treatment: 0 Height (in): 62 Weight (lbs): 135 Body Mass Index (BMI): 24.7 Nutrition Risk Assessment Items NUTRITION RISK SCREEN: I have an illness or condition that made me change the kind and/or amount of 0 No food I eat I eat fewer than two meals per day 3 Yes I eat few fruits and vegetables, or milk products 2 Yes I have three or more drinks of beer, liquor or wine almost every day 0 No I have tooth or mouth problems that make it hard for me to eat 0 No I don't always have enough money to buy the food I need 0 No I eat alone most of the time 1 Yes I take three or more different prescribed or over-the-counter drugs a day 1 Yes Without wanting to, I have lost or gained 10 pounds in the last six months 0 No I am not always physically able to shop, cook and/or feed myself 0 No Nutrition Protocols Good Risk Protocol Moderate Risk Protocol Electronic Signature(s) Signed: 03/17/2018 7:54:32 AM By: Harold Barban Entered By: Harold Barban on 03/16/2018 10:32:57

## 2018-03-18 NOTE — Progress Notes (Signed)
HATSUE, SIME ([id number]) Visit Report for 03/16/2018 Allergy List Details Patient Name: Heather [name], Heather [name] Date of Service: 03/16/2018 10:15 AM Medical Record Number: [id number] Patient Account Number: [id number] Date of Birth/Sex: [birth year] (85 y.o. F) Treating RN: Harold Barban Primary Care Melvena Vink: Benita Stabile Other Clinician: Referring Aundria Bitterman: Benita Stabile Treating Jarelly Rinck/Extender: Ricard Dillon Weeks in Treatment: 0 Allergies Active Allergies losartan Reaction: suicidal Severity: Severe Allergy Notes Electronic Signature(s) Signed: 03/17/2018 7:54:32 AM By: Harold Barban Entered By: Harold Barban on 03/16/2018 10:18:54 Wauters, Wynona Neat ([id number]) -------------------------------------------------------------------------------- Arrival Information Details Patient Name: Heather [name] Date of Service: 03/16/2018 10:15 AM Medical Record Number: [id number] Patient Account Number: [id number] Date of Birth/Sex: October 01, 1928 (85 y.o. F) Treating RN: Harold Barban Primary Care Lataunya Ruud: Benita Stabile Other Clinician: Referring Rosalie Buenaventura: Benita Stabile Treating Sharrie Self/Extender: Tito Dine in Treatment: 0 Visit Information Patient Arrived: Walker Arrival Time: 10:06 Accompanied By: sister Transfer Assistance: None Patient Identification Verified: Yes Secondary Verification Process Completed: Yes Electronic Signature(s) Signed: 03/17/2018 7:54:32 AM By: Harold Barban Entered By: Harold Barban on 03/16/2018 10:13:35 Bambrick, Wynona Neat ([id number]) -------------------------------------------------------------------------------- Clinic Level of Care Assessment Details Patient Name: Heather [name] Date of Service: 03/16/2018 10:15 AM Medical Record Number: [id number] Patient Account Number: [id number] Date of Birth/Sex: 1928-02-26 (85 y.o. F) Treating RN: Cornell Barman Primary Care Vivan Agostino: Benita Stabile Other Clinician: Referring Haydee Jabbour: Benita Stabile Treating  Jamareon Shimel/Extender: Tito Dine in Treatment: 0 Clinic Level of Care Assessment Items TOOL 1 Quantity Score []  - Use when EandM and Procedure is performed on INITIAL visit 0 ASSESSMENTS - Nursing Assessment / Reassessment X - General Physical Exam (combine w/ comprehensive assessment (listed just below) when 1 20 performed on new pt. evals) X- 1 25 Comprehensive Assessment (HX, ROS, Risk Assessments, Wounds Hx, etc.) ASSESSMENTS - Wound and Skin Assessment / Reassessment []  - Dermatologic / Skin Assessment (not related to wound area) 0 ASSESSMENTS - Ostomy and/or Continence Assessment and Care []  - Incontinence Assessment and Management 0 []  - 0 Ostomy Care Assessment and Management (repouching, etc.) PROCESS - Coordination of Care X - Simple Patient / Family Education for ongoing care 1 15 []  - 0 Complex (extensive) Patient / Family Education for ongoing care X- 1 10 Staff obtains Programmer, systems, Records, Test Results / Process Orders []  - 0 Staff telephones HHA, Nursing Homes / Clarify orders / etc []  - 0 Routine Transfer to another Facility (non-emergent condition) []  - 0 Routine Hospital Admission (non-emergent condition) X- 1 15 New Admissions / Biomedical engineer / Ordering NPWT, Apligraf, etc. []  - 0 Emergency Hospital Admission (emergent condition) PROCESS - Special Needs []  - Pediatric / Minor Patient Management 0 []  - 0 Isolation Patient Management []  - 0 Hearing / Language / Visual special needs []  - 0 Assessment of Community assistance (transportation, D/C planning, etc.) []  - 0 Additional assistance / Altered mentation []  - 0 Support Surface(s) Assessment (bed, cushion, seat, etc.) Giannattasio, Boyd D. ([id number]) INTERVENTIONS - Miscellaneous []  - External ear exam 0 []  - 0 Patient Transfer (multiple staff / Civil Service fast streamer / Similar devices) []  - 0 Simple Staple / Suture removal (25 or less) []  - 0 Complex Staple / Suture removal (26 or more) []  -  0 Hypo/Hyperglycemic Management (do not check if billed separately) []  - 0 Ankle / Brachial Index (ABI) - do not check if billed separately Has the patient been seen at the hospital within the last three years: Yes Total Score: 85 Level Of Care: New/Established - Level  3 Electronic Signature(s) Signed: 03/16/2018 5:55:39 PM By: Gretta Cool, BSN, RN, CWS, Kim RN, BSN Entered By: Gretta Cool, BSN, RN, CWS, Kim on 03/16/2018 11:59:32 TERESE, HEIER ([id number]) -------------------------------------------------------------------------------- Encounter Discharge Information Details Patient Name: Heather [name] Date of Service: 03/16/2018 10:15 AM Medical Record Number: [id number] Patient Account Number: [id number] Date of Birth/Sex: 1928-06-22 (85 y.o. F) Treating RN: Montey Hora Primary Care Reonna Finlayson: Benita Stabile Other Clinician: Referring Parke Jandreau: Benita Stabile Treating Monnie Gudgel/Extender: Tito Dine in Treatment: 0 Encounter Discharge Information Items Post Procedure Vitals Discharge Condition: Stable Temperature (F): 98.8 Ambulatory Status: Walker Pulse (bpm): 79 Discharge Destination: Home Respiratory Rate (breaths/min): 16 Transportation: Private Auto Blood Pressure (mmHg): 134/79 Accompanied By: caregiver Schedule Follow-up Appointment: Yes Clinical Summary of Care: Electronic Signature(s) Signed: 03/16/2018 4:40:34 PM By: Montey Hora Entered By: Montey Hora on 03/16/2018 11:45:59 Eschbach, Wynona Neat ([id number]) -------------------------------------------------------------------------------- Lower Extremity Assessment Details Patient Name: Heather [name] Date of Service: 03/16/2018 10:15 AM Medical Record Number: [id number] Patient Account Number: [id number] Date of Birth/Sex: October 11, 1928 (85 y.o. F) Treating RN: Harold Barban Primary Care Melessia Kaus: Benita Stabile Other Clinician: Referring Laneta Guerin: Benita Stabile Treating Abir Craine/Extender: Ricard Dillon Weeks in  Treatment: 0 Electronic Signature(s) Signed: 03/17/2018 7:54:32 AM By: Harold Barban Entered By: Harold Barban on 03/16/2018 10:18:12 Cimino, Wynona Neat ([id number]) -------------------------------------------------------------------------------- Multi Wound Chart Details Patient Name: Heather [name] Date of Service: 03/16/2018 10:15 AM Medical Record Number: [id number] Patient Account Number: [id number] Date of Birth/Sex: September 02, 1928 (85 y.o. F) Treating RN: Cornell Barman Primary Care Ysabel Stankovich: Benita Stabile Other Clinician: Referring Coleta Grosshans: Benita Stabile Treating Osa Fogarty/Extender: Tito Dine in Treatment: 0 Vital Signs Height(in): 62 Pulse(bpm): 71 Weight(lbs): 135 Blood Pressure(mmHg): 134/39 Body Mass Index(BMI): 25 Temperature(F): 98.8 Respiratory Rate 18 (breaths/min): Photos: Wound Location: Left Peri-anal - Proximal Left Peri-anal - Distal Right Peri-anal Wounding Event: Gradually Appeared Gradually Appeared Gradually Appeared Primary Etiology: Pressure Ulcer Pressure Ulcer Pressure Ulcer Comorbid History: Cataracts, Anemia, Cataracts, Anemia, Cataracts, Anemia, Hypertension, Received Hypertension, Received Hypertension, Received Chemotherapy Chemotherapy Chemotherapy Date Acquired: 12/19/2017 12/19/2017 12/19/2017 Weeks of Treatment: 0 0 0 Wound Status: Open Open Open Clustered Wound: No Yes No Measurements L x W x D 0.5x0.8x0.1 4x7.5x0.1 5x4.5x0.1 (cm) Area (cm) : 0.314 23.562 17.671 Volume (cm) : 0.031 2.356 1.767 Classification: Category/Stage II Category/Stage II Category/Stage II Exudate Amount: Medium Medium Medium Exudate Type: Serous Serous Serous Exudate Color: amber amber amber Wound Margin: Flat and Intact Flat and Intact Flat and Intact Granulation Amount: Small (1-33%) Small (1-33%) Small (1-33%) Granulation Quality: Pink N/A Pink Necrotic Amount: Small (1-33%) Small (1-33%) Small (1-33%) Exposed Structures: Fat Layer (Subcutaneous Fat  Layer (Subcutaneous Fat Layer (Subcutaneous Tissue) Exposed: Yes Tissue) Exposed: Yes Tissue) Exposed: Yes Fascia: No Fascia: No Fascia: No Tendon: No Tendon: No Tendon: No Muscle: No Muscle: No Muscle: No Joint: No Joint: No Joint: No Bone: No Bone: No Bone: No Pondexter, Kiele D. ([id number]) Epithelialization: Small (1-33%) Small (1-33%) Small (1-33%) Periwound Skin Texture: Excoriation: No Excoriation: No Excoriation: No Induration: No Induration: No Induration: No Callus: No Callus: No Callus: No Crepitus: No Crepitus: No Crepitus: No Rash: No Rash: No Rash: No Scarring: No Scarring: No Scarring: No Periwound Skin Moisture: Maceration: No Maceration: No Maceration: No Dry/Scaly: No Dry/Scaly: No Dry/Scaly: No Periwound Skin Color: Atrophie Blanche: No Atrophie Blanche: No Atrophie Blanche: No Cyanosis: No Cyanosis: No Cyanosis: No Ecchymosis: No Ecchymosis: No Ecchymosis: No Erythema: No Erythema: No Erythema: No Hemosiderin Staining: No Hemosiderin Staining: No Hemosiderin Staining: No Mottled: No  Mottled: No Mottled: No Pallor: No Pallor: No Pallor: No Rubor: No Rubor: No Rubor: No Tenderness on Palpation: No No Yes Wound Preparation: Ulcer Cleansing: Ulcer Cleansing: Wound Ulcer Cleansing: Wound Rinsed/Irrigated with Saline Cleanser Cleanser Topical Anesthetic Applied: Topical Anesthetic Applied: Topical Anesthetic Applied: Other: lidocaine 4% Other: lidocaine 4% Other: lidocaine 4% Procedures Performed: N/A N/A Biopsy Treatment Notes Electronic Signature(s) Signed: 03/16/2018 4:42:30 PM By: Linton Ham MD Entered By: Linton Ham on 03/16/2018 11:43:51 Moncur, Wynona Neat ([id number]) -------------------------------------------------------------------------------- Multi-Disciplinary Care Plan Details Patient Name: Heather [name] Date of Service: 03/16/2018 10:15 AM Medical Record Number: [id number] Patient Account Number:  [id number] Date of Birth/Sex: 14-Aug-1928 (85 y.o. F) Treating RN: Cornell Barman Primary Care Kadon Andrus: Benita Stabile Other Clinician: Referring Yoltzin Ransom: Benita Stabile Treating Hinley Brimage/Extender: Tito Dine in Treatment: 0 Active Inactive Abuse / Safety / Falls / Self Care Management Nursing Diagnoses: History of Falls Potential for falls Goals: Patient will not experience any injury related to falls Date Initiated: 03/16/2018 Target Resolution Date: 03/24/2018 Goal Status: Active Interventions: Assess fall risk on admission and as needed Notes: Nutrition Nursing Diagnoses: Potential for alteratiion in Nutrition/Potential for imbalanced nutrition Goals: Patient/caregiver agrees to and verbalizes understanding of need to obtain nutritional consultation Date Initiated: 03/16/2018 Target Resolution Date: 03/25/2018 Goal Status: Active Interventions: Provide education on nutrition Notes: Orientation to the Wound Care Program Nursing Diagnoses: Knowledge deficit related to the wound healing center program Goals: Patient/caregiver will verbalize understanding of the [facility] Date Initiated: 03/16/2018 Target Resolution Date: 03/25/2018 Goal Status: Active Interventions: NAKEIA, CALVI ([id number]) Provide education on orientation to the wound center Notes: Pain, Acute or Chronic Nursing Diagnoses: Pain, acute or chronic: actual or potential Potential alteration in comfort, pain Goals: Patient/caregiver will verbalize adequate pain control between visits Date Initiated: 03/16/2018 Target Resolution Date: 03/25/2018 Goal Status: Active Interventions: Assess comfort goal upon admission Treatment Activities: Administer pain control measures as ordered : 03/16/2018 Notes: Wound/Skin Impairment Nursing Diagnoses: Impaired tissue integrity Goals: Patient/caregiver will verbalize understanding of skin care regimen Date Initiated: 03/16/2018 Target Resolution  Date: 03/25/2018 Goal Status: Active Interventions: Assess ulceration(s) every visit Treatment Activities: Topical wound management initiated : 03/16/2018 Notes: Electronic Signature(s) Signed: 03/16/2018 11:58:08 AM By: Gretta Cool, BSN, RN, CWS, Kim RN, BSN Entered By: Gretta Cool, BSN, RN, CWS, Kim on 03/16/2018 11:58:07 Wojnar, Wynona Neat ([id number]) -------------------------------------------------------------------------------- Pain Assessment Details Patient Name: Heather [name] Date of Service: 03/16/2018 10:15 AM Medical Record Number: [id number] Patient Account Number: [id number] Date of Birth/Sex: [birth year] (85 y.o. F) Treating RN: Harold Barban Primary Care Donyae Kohn: Benita Stabile Other Clinician: Referring Sriman Tally: Benita Stabile Treating Jayziah Bankhead/Extender: Ricard Dillon Weeks in Treatment: 0 Active Problems Location of Pain Severity and Description of Pain Patient Has Paino No Site Locations Pain Management and Medication Current Pain Management: Electronic Signature(s) Signed: 03/17/2018 7:54:32 AM By: Harold Barban Entered By: Harold Barban on 03/16/2018 10:14:03 Clapper, Wynona Neat ([id number]) -------------------------------------------------------------------------------- Patient/Caregiver Education Details Patient Name: Heather [name] Date of Service: 03/16/2018 10:15 AM Medical Record Number: [id number] Patient Account Number: [id number] Date of Birth/Gender: 08-Mar-1928 (85 y.o. F) Treating RN: Montey Hora Primary Care Physician: Benita Stabile Other Clinician: Referring Physician: Benita Stabile Treating Physician/Extender: Tito Dine in Treatment: 0 Education Assessment Education Provided To: Patient and Caregiver Education Topics Provided Wound/Skin Impairment: Handouts: Caring for Your Ulcer Methods: Demonstration, Explain/Verbal Responses: State content correctly Electronic Signature(s) Signed: 03/16/2018 4:40:34 PM By: Montey Hora Entered By: Montey Hora on 03/16/2018 11:46:31 Jorden, Nasha D. ([id number]) --------------------------------------------------------------------------------  Wound Assessment Details Patient Name: Heather [name], Heather [name] Date of Service: 03/16/2018 10:15 AM Medical Record Number: [id number] Patient Account Number: [id number] Date of Birth/Sex: Sep 12, 1928 (85 y.o. F) Treating RN: Harold Barban Primary Care Jude Naclerio: Benita Stabile Other Clinician: Referring Mercedees Convery: Benita Stabile Treating Jeremiah Tarpley/Extender: Ricard Dillon Weeks in Treatment: 0 Wound Status Wound Number: 1 Primary Pressure Ulcer Etiology: Wound Location: Left Peri-anal - Proximal Wound Status: Open Wounding Event: Gradually Appeared Comorbid Cataracts, Anemia, Hypertension, Received Date Acquired: 12/19/2017 History: Chemotherapy Weeks Of Treatment: 0 Clustered Wound: No Photos Photo Uploaded By: Harold Barban on 03/16/2018 11:26:18 Wound Measurements Length: (cm) 0.5 Width: (cm) 0.8 Depth: (cm) 0.1 Area: (cm) 0.314 Volume: (cm) 0.031 % Reduction in Area: % Reduction in Volume: Epithelialization: Small (1-33%) Tunneling: No Undermining: No Wound Description Classification: Category/Stage II Wound Margin: Flat and Intact Exudate Amount: Medium Exudate Type: Serous Exudate Color: amber Foul Odor After Cleansing: No Slough/Fibrino Yes Wound Bed Granulation Amount: Small (1-33%) Exposed Structure Granulation Quality: Pink Fascia Exposed: No Necrotic Amount: Small (1-33%) Fat Layer (Subcutaneous Tissue) Exposed: Yes Necrotic Quality: Adherent Slough Tendon Exposed: No Muscle Exposed: No Joint Exposed: No Bone Exposed: No Periwound Skin Texture Barreiro, Ellory D. ([id number]) Texture Color No Abnormalities Noted: No No Abnormalities Noted: No Callus: No Atrophie Blanche: No Crepitus: No Cyanosis: No Excoriation: No Ecchymosis: No Induration: No Erythema: No Rash: No Hemosiderin Staining: No Scarring: No Mottled:  No Pallor: No Moisture Rubor: No No Abnormalities Noted: No Dry / Scaly: No Maceration: No Wound Preparation Ulcer Cleansing: Rinsed/Irrigated with Saline Topical Anesthetic Applied: Other: lidocaine 4%, Electronic Signature(s) Signed: 03/17/2018 7:54:32 AM By: Harold Barban Entered By: Harold Barban on 03/16/2018 10:44:24 Laroque, Wynona Neat ([id number]) -------------------------------------------------------------------------------- Wound Assessment Details Patient Name: Heather [name] Date of Service: 03/16/2018 10:15 AM Medical Record Number: [id number] Patient Account Number: [id number] Date of Birth/Sex: [birth year] (85 y.o. F) Treating RN: Harold Barban Primary Care Haydin Dunn: Benita Stabile Other Clinician: Referring Jerita Wimbush: TATE, Sharlet Salina Treating Marlies Ligman/Extender: Ricard Dillon Weeks in Treatment: 0 Wound Status Wound Number: 2 Primary Pressure Ulcer Etiology: Wound Location: Left Peri-anal - Distal Wound Status: Open Wounding Event: Gradually Appeared Comorbid Cataracts, Anemia, Hypertension, Received Date Acquired: 12/19/2017 History: Chemotherapy Weeks Of Treatment: 0 Clustered Wound: No Photos Photo Uploaded By: Harold Barban on 03/16/2018 11:26:18 Wound Measurements Length: (cm) 4 Width: (cm) 7.5 Depth: (cm) 0.1 Area: (cm) 23.562 Volume: (cm) 2.356 % Reduction in Area: % Reduction in Volume: Epithelialization: Small (1-33%) Tunneling: No Undermining: No Wound Description Classification: Category/Stage II Wound Margin: Flat and Intact Exudate Amount: Medium Exudate Type: Serous Exudate Color: amber Foul Odor After Cleansing: No Slough/Fibrino Yes Wound Bed Granulation Amount: Small (1-33%) Exposed Structure Necrotic Amount: Small (1-33%) Fascia Exposed: No Necrotic Quality: Adherent Slough Fat Layer (Subcutaneous Tissue) Exposed: Yes Tendon Exposed: No Muscle Exposed: No Joint Exposed: No Bone Exposed: No Periwound Skin Texture Corvin,  Verlaine D. ([id number]) Texture Color No Abnormalities Noted: No No Abnormalities Noted: No Callus: No Atrophie Blanche: No Crepitus: No Cyanosis: No Excoriation: No Ecchymosis: No Induration: No Erythema: No Rash: No Hemosiderin Staining: No Scarring: No Mottled: No Pallor: No Moisture Rubor: No No Abnormalities Noted: No Dry / Scaly: No Maceration: No Wound Preparation Ulcer Cleansing: Wound Cleanser Topical Anesthetic Applied: Other: lidocaine 4%, Electronic Signature(s) Signed: 03/17/2018 7:54:32 AM By: Harold Barban Entered By: Harold Barban on 03/16/2018 10:46:14 Haney, Wynona Neat ([id number]) -------------------------------------------------------------------------------- Wound Assessment Details Patient Name: Heather [name] Date of Service: 03/16/2018 10:15 AM Medical Record Number: [id number] Patient Account  Number: [id number] Date of Birth/Sex: 11-May-1928 (85 y.o. F) Treating RN: Harold Barban Primary Care Cashae Weich: Benita Stabile Other Clinician: Referring Eirene Rather: Benita Stabile Treating Araly Kaas/Extender: Ricard Dillon Weeks in Treatment: 0 Wound Status Wound Number: 3 Primary Pressure Ulcer Etiology: Wound Location: Right Peri-anal Wound Status: Open Wounding Event: Gradually Appeared Comorbid Cataracts, Anemia, Hypertension, Received Date Acquired: 12/19/2017 History: Chemotherapy Weeks Of Treatment: 0 Clustered Wound: No Photos Photo Uploaded By: Harold Barban on 03/16/2018 11:26:44 Wound Measurements Length: (cm) 5 Width: (cm) 4.5 Depth: (cm) 0.1 Area: (cm) 17.671 Volume: (cm) 1.767 % Reduction in Area: % Reduction in Volume: Epithelialization: Small (1-33%) Tunneling: No Undermining: No Wound Description Classification: Category/Stage II Wound Margin: Flat and Intact Exudate Amount: Medium Exudate Type: Serous Exudate Color: amber Foul Odor After Cleansing: No Slough/Fibrino Yes Wound Bed Granulation Amount: Small (1-33%)  Exposed Structure Granulation Quality: Pink Fascia Exposed: No Necrotic Amount: Small (1-33%) Fat Layer (Subcutaneous Tissue) Exposed: Yes Necrotic Quality: Adherent Slough Tendon Exposed: No Muscle Exposed: No Joint Exposed: No Bone Exposed: No Periwound Skin Texture Mossbarger, Maelie D. ([id number]) Texture Color No Abnormalities Noted: No No Abnormalities Noted: No Callus: No Atrophie Blanche: No Crepitus: No Cyanosis: No Excoriation: No Ecchymosis: No Induration: No Erythema: No Rash: No Hemosiderin Staining: No Scarring: No Mottled: No Pallor: No Moisture Rubor: No No Abnormalities Noted: No Dry / Scaly: No Temperature / Pain Maceration: No Tenderness on Palpation: Yes Wound Preparation Ulcer Cleansing: Wound Cleanser Topical Anesthetic Applied: Other: lidocaine 4%, Electronic Signature(s) Signed: 03/17/2018 7:54:32 AM By: Harold Barban Entered By: Harold Barban on 03/16/2018 10:47:38 Dovel, Wynona Neat ([id number]) -------------------------------------------------------------------------------- Vitals Details Patient Name: Heather [name] Date of Service: 03/16/2018 10:15 AM Medical Record Number: [id number] Patient Account Number: [id number] Date of Birth/Sex: [birth year] (85 y.o. F) Treating RN: Harold Barban Primary Care Arlow Spiers: Benita Stabile Other Clinician: Referring Natalyn Szymanowski: TATE, Sharlet Salina Treating Arnisha Laffoon/Extender: Tito Dine in Treatment: 0 Vital Signs Time Taken: 10:15 Temperature (F): 98.8 Height (in): 62 Pulse (bpm): 79 Source: Stated Respiratory Rate (breaths/min): 18 Weight (lbs): 135 Blood Pressure (mmHg): 134/39 Source: Stated Reference Range: 80 - 120 mg / dl Body Mass Index (BMI): 24.7 Electronic Signature(s) Signed: 03/17/2018 7:54:32 AM By: Harold Barban Entered By: Harold Barban on 03/16/2018 10:17:12

## 2018-03-22 ENCOUNTER — Telehealth: Payer: Self-pay | Admitting: *Deleted

## 2018-03-22 NOTE — Telephone Encounter (Signed)
Rowe Clack, occupational therapy with [facility] called asking for approval of orders for occupational therapy once a week time 2 and 2 times a week time 1. Also asking if patient can get her wound wet by showering.PLease advise

## 2018-03-23 NOTE — Telephone Encounter (Signed)
Call returned to [place] to give verbal order to approve occupational therapy  And advise that she contact Wound care for her question about getting wound wet

## 2018-03-24 NOTE — Progress Notes (Signed)
DAISA, STENNIS ([id number]) Visit Report for 03/16/2018 Biopsy Details Patient Name: Heather [name], Heather [name] Date of Service: 03/16/2018 10:15 AM Medical Record Number: [id number] Patient Account Number: [id number] Date of Birth/Sex: 1928-08-02 (85 y.o. F) Treating RN: Cornell Barman Primary Care Provider: Benita Stabile Other Clinician: Referring Provider: Benita Stabile Treating Provider/Extender: Tito Dine in Treatment: 0 Biopsy Performed for: Wound #3 Right Peri-anal Location(s): Peri-Ulcer Tissue Performed By: Physician Ricard Dillon, MD Tissue Punch: No Number of Specimens Taken: 1 Specimen Sent To Pathology: Yes Level of Consciousness (Pre-procedure): Awake and Alert Pre-procedure Verification/Time-Out Taken: Yes - 10:59 Pain Control: Lidocaine Injectable Lidocaine Percent: 2% Instrument: Forceps, Other: punch Bleeding: Moderate Hemostasis Achieved: Silver Nitrate Procedural Pain: 2 Post Procedural Pain: 2 Response to Treatment: Procedure was tolerated well Level of Consciousness (Post-procedure): Awake and Alert Post Procedure Diagnosis Same as Pre-procedure Electronic Signature(s) Signed: 03/16/2018 4:42:30 PM By: Linton Ham MD Entered By: Linton Ham on 03/16/2018 11:44:11 [place], Wynona Neat ([id number]) -------------------------------------------------------------------------------- HPI Details Patient Name: Heather [name] Date of Service: 03/16/2018 10:15 AM Medical Record Number: [id number] Patient Account Number: [id number] Date of Birth/Sex: September 05, 1928 (85 y.o. F) Treating RN: Cornell Barman Primary Care Provider: Benita Stabile Other Clinician: Referring Provider: Benita Stabile Treating Provider/Extender: Tito Dine in Treatment: 0 History of Present Illness HPI Description: Admission 03/16/2018 This is a frail 85 year old woman who was sent here from her oncologist when it was discovered that she had large perianal ulcerations. An appointment is also  been made with general surgery. The clinic appointment with oncology was just yesterday. The patient is followed for chronic lymphocytic leukemia. She is on Venclexta however I believe that was put on hold yesterday when the ulcerations were discovered. There is very little available history here. Her sister who accompanies her said that she is aware for about the last month that the patient was complaining of pain in the perirectal area but she did not actually see anything and the patient has been taking care of things herself. The patient seems unaware of how long this is actually been present but she seems to be suggesting that it is longer than a month. In any case there is no other information. I do not believe she is ever had a colonoscopy or at least they did not tell me about this. She is in a lot of pain but states her bowel movements are formed about every second day no rectal bleeding. In general she is not doing particularly well she does not eat well she is lost weight. She has become more dependent in the last month living in a seniors residence I believe called [facility]. Past medical history includes chronic lymphocytic leukemia, tumor lysis syndrome, hearing loss, hypertension, thrombocytopenia and hypothyroidism Electronic Signature(s) Signed: 03/16/2018 4:42:30 PM By: Linton Ham MD Entered By: Linton Ham on 03/16/2018 11:49:29 [place], Wynona Neat ([id number]) -------------------------------------------------------------------------------- Physical Exam Details Patient Name: Heather [name] Date of Service: 03/16/2018 10:15 AM Medical Record Number: [id number] Patient Account Number: [id number] Date of Birth/Sex: [birth year] (85 y.o. F) Treating RN: Cornell Barman Primary Care Provider: Benita Stabile Other Clinician: Referring Provider: Benita Stabile Treating Provider/Extender: Ricard Dillon Weeks in Treatment: 0 Constitutional Sitting or standing Blood Pressure  is within target range for patient.. Pulse regular and within target range for patient.Marland Kitchen Respirations regular, non-labored and within target range.. Temperature is normal and within the target range for the patient.Marland Kitchen appears in no distress However very frail appears very frail. Eyes Conjunctivae clear.  No discharge. Respiratory Respiratory effort is easy and symmetric bilaterally. Rate is normal at rest and on room air.. Bilateral breath sounds are clear and equal in all lobes with no wheezes, rales or rhonchi.. Cardiovascular Heart rhythm and rate regular, without murmur or gallop.. Gastrointestinal (GI) Abdomen is soft and non-distended without masses or tenderness. Bowel sounds active in all quadrants.. I did not feel her spleen although I did not position her properly. No liver was palpable nontender. Genitourinary (GU) non distended. Integumentary (Hair, Skin) No primary skin issues are seen. She has 4 large skin ulcerations on her perianal area. Rectal exam did not show any palpable abnormalities. Formed stool. Psychiatric Very vague in response to direct questions I suspect some degree of cognitive impairment. Notes Wound exam; oOn the left she has a large but superficial ulceration over a large area area. this encompasses her anla opening Irregular areas of formed epithelium and small islands in the wound. An area of the superior edge of this was anesthetized to be far away from her anal l opening. The area was injected with lidocaine and a punch biopsy was obtained for pathology. I did not obtain any for culture although that would be possible to do oOn the right she has the same type of ulceration but 3 separate lesions these are well away from the anal opening this encompasses her anal opening. Electronic Signature(s) Signed: 03/16/2018 4:42:30 PM By: Linton Ham MD Entered By: Linton Ham on 03/16/2018 11:56:18 [place], Wynona Neat  ([id number]) -------------------------------------------------------------------------------- Physician Orders Details Patient Name: Heather [name] Date of Service: 03/16/2018 10:15 AM Medical Record Number: [id number] Patient Account Number: [id number] Date of Birth/Sex: Aug 28, 1928 (85 y.o. F) Treating RN: Cornell Barman Primary Care Provider: Benita Stabile Other Clinician: Referring Provider: Benita Stabile Treating Provider/Extender: Tito Dine in Treatment: 0 Verbal / Phone Orders: No Diagnosis Coding Wound Cleansing Wound #1 Left,Proximal Peri-anal o Cleanse wound with mild soap and water Wound #2 Left,Distal Peri-anal o Cleanse wound with mild soap and water Wound #3 Right Peri-anal o Cleanse wound with mild soap and water Anesthetic (add to Medication List) Wound #1 Left,Proximal Peri-anal o Topical Lidocaine 4% cream applied to wound bed prior to debridement (In Clinic Only). Wound #2 Left,Distal Peri-anal o Topical Lidocaine 4% cream applied to wound bed prior to debridement (In Clinic Only). Wound #3 Right Peri-anal o Topical Lidocaine 4% cream applied to wound bed prior to debridement (In Clinic Only). Skin Barriers/Peri-Wound Care Wound #1 Left,Proximal Peri-anal o Barrier cream - Desitin or Zinc Oxide Wound #2 Left,Distal Peri-anal o Barrier cream - Desitin or Zinc Oxide Wound #3 Right Peri-anal o Barrier cream - Desitin or Zinc Oxide Follow-up Appointments Wound #1 Left,Proximal Peri-anal o Return Appointment in 2 weeks. Wound #2 Left,Distal Peri-anal o Return Appointment in 2 weeks. Wound #3 Right Peri-anal o Return Appointment in 2 weeks. Home Health Wound #1 Left,Proximal [place] Nurse may visit PRN to address patientos wound care needs. TARONDA, COMACHO ([id number]) o FACE TO FACE ENCOUNTER: MEDICARE and MEDICAID PATIENTS: I certify that this patient is under my care and that I  had a face-to-face encounter that meets the physician face-to-face encounter requirements with this patient on this date. The encounter with the patient was in whole or in part for the following MEDICAL CONDITION: (primary reason for [place]) MEDICAL NECESSITY: I certify, that based on my findings, NURSING services are a medically necessary home health service. HOME BOUND STATUS: I  certify that my clinical findings support that this patient is homebound (i.e., Due to illness or injury, pt requires aid of supportive devices such as crutches, cane, wheelchairs, walkers, the use of special transportation or the assistance of another person to leave their place of residence. There is a normal inability to leave the home and doing so requires considerable and taxing effort. Other absences are for medical reasons / religious services and are infrequent or of short duration when for other reasons). o If current dressing causes regression in wound condition, may D/C ordered dressing product/s and apply Normal Saline Moist Dressing daily until next [place] / Other MD appointment. [place] of regression in wound condition at [phone]. o Please direct any NON-WOUND related issues/requests for orders to patient's Primary Care Physician Wound #2 [place] Nurse may visit PRN to address patientos wound care needs. o FACE TO FACE ENCOUNTER: MEDICARE and MEDICAID PATIENTS: I certify that this patient is under my care and that I had a face-to-face encounter that meets the physician face-to-face encounter requirements with this patient on this date. The encounter with the patient was in whole or in part for the following MEDICAL CONDITION: (primary reason for [place]) MEDICAL NECESSITY: I certify, that based on my findings, NURSING services are a medically necessary home health service. HOME BOUND  STATUS: I certify that my clinical findings support that this patient is homebound (i.e., Due to illness or injury, pt requires aid of supportive devices such as crutches, cane, wheelchairs, walkers, the use of special transportation or the assistance of another person to leave their place of residence. There is a normal inability to leave the home and doing so requires considerable and taxing effort. Other absences are for medical reasons / religious services and are infrequent or of short duration when for other reasons). o If current dressing causes regression in wound condition, may D/C ordered dressing product/s and apply Normal Saline Moist Dressing daily until next [place] / Other MD appointment. [place] of regression in wound condition at [phone]. o Please direct any NON-WOUND related issues/requests for orders to patient's Primary Care Physician Wound #3 Right [place] Nurse may visit PRN to address patientos wound care needs. o FACE TO FACE ENCOUNTER: MEDICARE and MEDICAID PATIENTS: I certify that this patient is under my care and that I had a face-to-face encounter that meets the physician face-to-face encounter requirements with this patient on this date. The encounter with the patient was in whole or in part for the following MEDICAL CONDITION: (primary reason for [place]) MEDICAL NECESSITY: I certify, that based on my findings, NURSING services are a medically necessary home health service. HOME BOUND STATUS: I certify that my clinical findings support that this patient is homebound (i.e., Due to illness or injury, pt requires aid of supportive devices such as crutches, cane, wheelchairs, walkers, the use of special transportation or the assistance of another person to leave their place of residence. There is a normal inability to leave the home and doing so requires considerable  and taxing effort. Other absences are for medical reasons / religious services and are infrequent or of short duration when for other reasons). o If current dressing causes regression in wound condition, may D/C ordered dressing product/s and apply Normal Saline Moist Dressing daily until next [place] / Other MD appointment. [facility] of regression in wound condition at [phone]. o Please direct any NON-WOUND related issues/requests for orders to patient's Primary Care Physician Laboratory o Bacteria identified in Tissue by Biopsy culture (MICRO) oooo LOINC Code: [id number] oooo Convenience Name: Biopsy specimen culture IMAAN, PADGETT ([id number]) Electronic Signature(s) Signed: 03/16/2018 5:55:39 PM By: Gretta Cool, BSN, RN, CWS, Kim RN, BSN Signed: 03/23/2018 5:05:37 PM By: Linton Ham MD Previous Signature: 03/16/2018 4:42:30 PM Version By: Linton Ham MD Entered By: Gretta Cool, BSN, RN, CWS, Kim on 03/16/2018 17:18:11 TENNYSON, KALLEN ([id number]) -------------------------------------------------------------------------------- Problem List Details Patient Name: Heather [name] Date of Service: 03/16/2018 10:15 AM Medical Record Number: [id number] Patient Account Number: [id number] Date of Birth/Sex: [birth year] (85 y.o. F) Treating RN: Cornell Barman Primary Care Provider: Benita Stabile Other Clinician: Referring Provider: Benita Stabile Treating Provider/Extender: Tito Dine in Treatment: 0 Active Problems ICD-10 Evaluated Encounter Code Description Active Date Today Diagnosis K62.6 Ulcer of anus and rectum 03/16/2018 No Yes L98.499 Non-pressure chronic ulcer of skin of other sites with 03/16/2018 No Yes unspecified severity C91.10 Chronic lymphocytic leukemia of B-cell type not having 03/16/2018 No Yes achieved remission Inactive Problems Resolved Problems Electronic Signature(s) Signed: 03/16/2018 4:42:30 PM By: Linton Ham MD Entered By:  Linton Ham on 03/16/2018 11:40:38 [place], Wynona Neat ([id number]) -------------------------------------------------------------------------------- Progress Note Details Patient Name: Heather [name] Date of Service: 03/16/2018 10:15 AM Medical Record Number: [id number] Patient Account Number: [id number] Date of Birth/Sex: [birth year] (85 y.o. F) Treating RN: Cornell Barman Primary Care Provider: Benita Stabile Other Clinician: Referring Provider: Benita Stabile Treating Provider/Extender: Tito Dine in Treatment: 0 Subjective History of Present Illness (HPI) Admission 03/16/2018 This is a frail 85 year old woman who was sent here from her oncologist when it was discovered that she had large perianal ulcerations. An appointment is also been made with general surgery. The clinic appointment with oncology was just yesterday. The patient is followed for chronic lymphocytic leukemia. She is on Venclexta however I believe that was put on hold yesterday when the ulcerations were discovered. There is very little available history here. Her sister who accompanies her said that she is aware for about the last month that the patient was complaining of pain in the perirectal area but she did not actually see anything and the patient has been taking care of things herself. The patient seems unaware of how long this is actually been present but she seems to be suggesting that it is longer than a month. In any case there is no other information. I do not believe she is ever had a colonoscopy or at least they did not tell me about this. She is in a lot of pain but states her bowel movements are formed about every second day no rectal bleeding. In general she is not doing particularly well she does not eat well she is lost weight. She has become more dependent in the last month living in a seniors residence I believe called [facility]. Past medical history includes chronic lymphocytic leukemia,  tumor lysis syndrome, hearing loss, hypertension, thrombocytopenia and hypothyroidism Wound History Patient presents with 1 open wound that has been present for approximately 1 month. Patient has been treating wound in the following manner: AD ointment. Laboratory tests have not been performed in the last month. Patient reportedly has not tested positive for an antibiotic resistant organism. Patient reportedly has not tested positive for osteomyelitis. Patient reportedly has not had testing performed to evaluate circulation in the legs. Patient History Information obtained  from Patient, sister. Allergies losartan (Severity: Severe, Reaction: suicidal) Family History Cancer - Father, Heart Disease - Mother, No family history of Diabetes, Hereditary Spherocytosis, Hypertension, Kidney Disease, Lung Disease, Seizures, Stroke, Thyroid Problems, Tuberculosis. Social History Never smoker, Marital Status - Widowed, Alcohol Use - Never, Drug Use - No History, Caffeine Use - Daily - coffee soda. Medical History Eyes Patient has history of Cataracts - bilateral Denies history of Glaucoma, Optic Neuritis Ear/Nose/Mouth/Throat Denies history of Chronic sinus problems/congestion, Middle ear problems YEMAYA, BARNIER ([id number]) Hematologic/Lymphatic Patient has history of Anemia Denies history of Hemophilia, Human Immunodeficiency Virus, Lymphedema, Sickle Cell Disease Respiratory Denies history of Aspiration, Asthma, Chronic Obstructive Pulmonary Disease (COPD), Pneumothorax, Tuberculosis Cardiovascular Patient has history of Hypertension Denies history of Angina, Arrhythmia, Congestive Heart Failure, Coronary Artery Disease, Deep Vein Thrombosis, Hypotension, Myocardial Infarction, Peripheral Arterial Disease, Peripheral Venous Disease, Phlebitis, Vasculitis Gastrointestinal Denies history of Cirrhosis , Colitis, Crohn s, Hepatitis A, Hepatitis B, Hepatitis C Endocrine Denies history of Type I  Diabetes, Type II Diabetes Genitourinary Denies history of End Stage Renal Disease Immunological Denies history of Lupus Erythematosus, Raynaud s, Scleroderma Integumentary (Skin) Denies history of History of Burn, History of pressure wounds Musculoskeletal Denies history of Gout, Rheumatoid Arthritis, Osteoarthritis, Osteomyelitis Neurologic Denies history of Dementia, Neuropathy, Quadriplegia, Paraplegia, Seizure Disorder Oncologic Patient has history of Received Chemotherapy - lymphocytic leukemia Denies history of Received Radiation Psychiatric Denies history of Anorexia/bulimia, Confinement Anxiety Medical And Surgical History Notes Cardiovascular Palpitations Hyperlipidema Review of Systems (ROS) Constitutional Symptoms (General Health) The patient has no complaints or symptoms. Eyes Complains or has symptoms of Glasses / Contacts. Ear/Nose/Mouth/Throat Complains or has symptoms of Difficult clearing ears - hearing loss. Hematologic/Lymphatic The patient has no complaints or symptoms, Thrombocytopenia Respiratory The patient has no complaints or symptoms. Cardiovascular Complains or has symptoms of LE edema. Gastrointestinal The patient has no complaints or symptoms. Endocrine Complains or has symptoms of Thyroid disease - Hypothyroidism. Denies complaints or symptoms of Polydypsia (Excessive Thirst). Genitourinary The patient has no complaints or symptoms. Immunological The patient has no complaints or symptoms. Integumentary (Skin) The patient has no complaints or symptoms. Musculoskeletal Complains or has symptoms of Muscle Weakness. Neurologic ANNAGRACE, CARR ([id number]) The patient has no complaints or symptoms. Oncologic The patient has no complaints or symptoms. Psychiatric The patient has no complaints or symptoms. Objective Constitutional Sitting or standing Blood Pressure is within target range for patient.. Pulse regular and within target range  for patient.Marland Kitchen Respirations regular, non-labored and within target range.. Temperature is normal and within the target range for the patient.Marland Kitchen appears in no distress However very frail appears very frail. Vitals Time Taken: 10:15 AM, Height: 62 in, Source: Stated, Weight: 135 lbs, Source: Stated, BMI: 24.7, Temperature: 98.8  F, Pulse: 79 bpm, Respiratory Rate: 18 breaths/min, Blood Pressure: 134/39 mmHg. Eyes Conjunctivae clear. No discharge. Respiratory Respiratory effort is easy and symmetric bilaterally. Rate is normal at rest and on room air.. Bilateral breath sounds are clear and equal in all lobes with no wheezes, rales or rhonchi.. Cardiovascular Heart rhythm and rate regular, without murmur or gallop.. Gastrointestinal (GI) Abdomen is soft and non-distended without masses or tenderness. Bowel sounds active in all quadrants.. I did not feel her spleen although I did not position her properly. No liver was palpable nontender. Genitourinary (GU) non distended. Psychiatric Very vague in response to direct questions I suspect some degree of cognitive impairment. General Notes: Wound exam; On the left she has a large but superficial ulceration  over a large area area. this encompasses her anla opening Irregular areas of formed epithelium and small islands in the wound. An area of the superior edge of this was anesthetized to be far away from her anal l opening. The area was injected with lidocaine and a punch biopsy was obtained for pathology. I did not obtain any for culture although that would be possible to do On the right she has the same type of ulceration but 3 separate lesions these are well away from the anal opening this encompasses her anal opening. Integumentary (Hair, Skin) No primary skin issues are seen. She has 4 large skin ulcerations on her perianal area. Rectal exam did not show any palpable abnormalities. Formed stool. Wound #1 status is Open. Original cause of wound  was Gradually Appeared. The wound is located on the Left,Proximal Peri- anal. The wound measures 0.5cm length x 0.8cm width x 0.1cm depth; 0.314cm^2 area and 0.031cm^3 volume. There is Fat Layer (Subcutaneous Tissue) Exposed exposed. There is no tunneling or undermining noted. There is a medium amount of Mccarry, Tifini D. ([id number]) serous drainage noted. The wound margin is flat and intact. There is small (1-33%) pink granulation within the wound bed. There is a small (1-33%) amount of necrotic tissue within the wound bed including Adherent Slough. The periwound skin appearance did not exhibit: Callus, Crepitus, Excoriation, Induration, Rash, Scarring, Dry/Scaly, Maceration, Atrophie Blanche, Cyanosis, Ecchymosis, Hemosiderin Staining, Mottled, Pallor, Rubor, Erythema. Wound #2 status is Open. Original cause of wound was Gradually Appeared. The wound is located on the Left,Distal Peri- anal. The wound measures 4cm length x 7.5cm width x 0.1cm depth; 23.562cm^2 area and 2.356cm^3 volume. There is Fat Layer (Subcutaneous Tissue) Exposed exposed. There is no tunneling or undermining noted. There is a medium amount of serous drainage noted. The wound margin is flat and intact. There is small (1-33%) granulation within the wound bed. There is a small (1-33%) amount of necrotic tissue within the wound bed including Adherent Slough. The periwound skin appearance did not exhibit: Callus, Crepitus, Excoriation, Induration, Rash, Scarring, Dry/Scaly, Maceration, Atrophie Blanche, Cyanosis, Ecchymosis, Hemosiderin Staining, Mottled, Pallor, Rubor, Erythema. Wound #3 status is Open. Original cause of wound was Gradually Appeared. The wound is located on the Right Peri-anal. The wound measures 5cm length x 4.5cm width x 0.1cm depth; 17.671cm^2 area and 1.767cm^3 volume. There is Fat Layer (Subcutaneous Tissue) Exposed exposed. There is no tunneling or undermining noted. There is a medium amount of  serous drainage noted. The wound margin is flat and intact. There is small (1-33%) pink granulation within the wound bed. There is a small (1-33%) amount of necrotic tissue within the wound bed including Adherent Slough. The periwound skin appearance did not exhibit: Callus, Crepitus, Excoriation, Induration, Rash, Scarring, Dry/Scaly, Maceration, Atrophie Blanche, Cyanosis, Ecchymosis, Hemosiderin Staining, Mottled, Pallor, Rubor, Erythema. The periwound has tenderness on palpation. Assessment Active Problems ICD-10 Ulcer of anus and rectum Non-pressure chronic ulcer of skin of other sites with unspecified severity Chronic lymphocytic leukemia of B-cell type not having achieved remission Procedures Wound #3 Pre-procedure diagnosis of Wound #3 is a Pressure Ulcer located on the Right Peri-anal . There was a biopsy performed by Ricard Dillon, MD. There was a biopsy performed on Peri-Ulcer Tissue. The skin was cleansed and prepped with anti-septic followed by pain control using Lidocaine Injectable: 2%. Tissue was removed at its base with the following instrument(s): Forceps and Other and sent to pathology. A Moderate amount of bleeding was controlled with Silver Nitrate.  A time out was conducted at 10:59, prior to the start of the procedure. The procedure was tolerated well with a pain level of 2 throughout and a pain level of 2 following the procedure. Post procedure Diagnosis Wound #3: Same as Pre-Procedure Plan Wound Cleansing: Wound #1 Left,Proximal Peri-anal: TENNILE, STYLES. ([id number]) Cleanse wound with mild soap and water Wound #2 Left,Distal Peri-anal: Cleanse wound with mild soap and water Wound #3 Right Peri-anal: Cleanse wound with mild soap and water Anesthetic (add to Medication List): Wound #1 Left,Proximal Peri-anal: Topical Lidocaine 4% cream applied to wound bed prior to debridement (In Clinic Only). Wound #2 Left,Distal Peri-anal: Topical Lidocaine 4% cream  applied to wound bed prior to debridement (In Clinic Only). Wound #3 Right Peri-anal: Topical Lidocaine 4% cream applied to wound bed prior to debridement (In Clinic Only). Skin Barriers/Peri-Wound Care: Wound #1 Left,Proximal Peri-anal: Barrier cream - Desitin or Zinc Oxide Wound #2 Left,Distal Peri-anal: Barrier cream - Desitin or Zinc Oxide Wound #3 Right Peri-anal: Barrier cream - Desitin or Zinc Oxide Follow-up Appointments: Wound #1 Left,Proximal Peri-anal: Return Appointment in 2 weeks. Wound #2 Left,Distal Peri-anal: Return Appointment in 2 weeks. Wound #3 Right Peri-anal: Return Appointment in 2 weeks. Home Health: Wound #1 Left,Proximal Peri-anal: [place] Nurse may visit PRN to address patient s wound care needs. FACE TO FACE ENCOUNTER: MEDICARE and MEDICAID PATIENTS: I certify that this patient is under my care and that I had a face-to-face encounter that meets the physician face-to-face encounter requirements with this patient on this date. The encounter with the patient was in whole or in part for the following MEDICAL CONDITION: (primary reason for [place]) MEDICAL NECESSITY: I certify, that based on my findings, NURSING services are a medically necessary home health service. HOME BOUND STATUS: I certify that my clinical findings support that this patient is homebound (i.e., Due to illness or injury, pt requires aid of supportive devices such as crutches, cane, wheelchairs, walkers, the use of special transportation or the assistance of another person to leave their place of residence. There is a normal inability to leave the home and doing so requires considerable and taxing effort. Other absences are for medical reasons / religious services and are infrequent or of short duration when for other reasons). If current dressing causes regression in wound condition, may D/C ordered dressing product/s and apply Normal Saline Moist  Dressing daily until next [place] / Other MD appointment. [place] of regression in wound condition at [phone]. Please direct any NON-WOUND related issues/requests for orders to patient's Primary Care Physician Wound #2 Left,Distal Peri-anal: [place] Nurse may visit PRN to address patient s wound care needs. FACE TO FACE ENCOUNTER: MEDICARE and MEDICAID PATIENTS: I certify that this patient is under my care and that I had a face-to-face encounter that meets the physician face-to-face encounter requirements with this patient on this date. The encounter with the patient was in whole or in part for the following MEDICAL CONDITION: (primary reason for [place]) MEDICAL NECESSITY: I certify, that based on my findings, NURSING services are a medically necessary home health service. HOME BOUND STATUS: I certify that my clinical findings support that this patient is homebound (i.e., Due to illness or injury, pt requires aid of supportive devices such as crutches, cane, wheelchairs, walkers, the use of special transportation or the assistance of another person to leave their place of residence. There is a normal inability  to leave the home and doing so requires considerable and taxing effort. Other absences are for medical reasons / religious services and are infrequent or of short duration when for other reasons). If current dressing causes regression in wound condition, may D/C ordered dressing product/s and apply Normal Saline Moist Dressing daily until next [place] / Other MD appointment. [place] of regression in wound condition at [phone]. Please direct any NON-WOUND related issues/requests for orders to patient's Primary Care Physician Wound #3 Right Peri-anal: SAMENTHA, PERHAM ([id number]) [place] Nurse may visit PRN to address patient s wound care  needs. FACE TO FACE ENCOUNTER: MEDICARE and MEDICAID PATIENTS: I certify that this patient is under my care and that I had a face-to-face encounter that meets the physician face-to-face encounter requirements with this patient on this date. The encounter with the patient was in whole or in part for the following MEDICAL CONDITION: (primary reason for [place]) MEDICAL NECESSITY: I certify, that based on my findings, NURSING services are a medically necessary home health service. HOME BOUND STATUS: I certify that my clinical findings support that this patient is homebound (i.e., Due to illness or injury, pt requires aid of supportive devices such as crutches, cane, wheelchairs, walkers, the use of special transportation or the assistance of another person to leave their place of residence. There is a normal inability to leave the home and doing so requires considerable and taxing effort. Other absences are for medical reasons / religious services and are infrequent or of short duration when for other reasons). If current dressing causes regression in wound condition, may D/C ordered dressing product/s and apply Normal Saline Moist Dressing daily until next [place] / Other MD appointment. [place] of regression in wound condition at [phone]. Please direct any NON-WOUND related issues/requests for orders to patient's Primary Care Physician 1. The cause of this extensive ulceration is not clear. None of them look particularly infected or overtly malignant. She does not have a history of Crohn's disease. She has been on chemotherapy which puts her at some risk for atypical ulcerations but nothing looked exactly pathognomonic for any particular cause. The history of these also is completely lacking. She sees general surgery tomorrow, I be wondering if they had any additional thoughts. 2. Her rectal exam was normal. I wonder whether a proctoscopic exam would be  recommended 3. I am not sure how to help this poor woman. This is not an area that would be easily addressed with standard dressings. I suggested zinc oxide with gauze which should help with at least cleaning after bowel movements. 4. The patient has home health although I am not exactly sure what they are seeing her for [facility] home care] 5. She seems to be frail and failing in the relatively independent setting where she is living. According to her sister she does not eat well and is become more dependent with her medications etc. Electronic Signature(s) Signed: 03/16/2018 4:42:30 PM By: Linton Ham MD Entered By: Linton Ham on 03/16/2018 11:58:49 Gugliotta, Wynona Neat ([id number]) -------------------------------------------------------------------------------- ROS/PFSH Details Patient Name: Heather [name] Date of Service: 03/16/2018 10:15 AM Medical Record Number: [id number] Patient Account Number: [id number] Date of Birth/Sex: 05/19/1928 (85 y.o. F) Treating RN: Harold Barban Primary Care Provider: Benita Stabile Other Clinician: Referring Provider: Benita Stabile Treating Provider/Extender: Tito Dine in Treatment: 0 Information Obtained From Patient Other: sister Wound History Do you currently have one or  more open woundso Yes How many open wounds do you currently haveo 1 Approximately how long have you had your woundso 1 month How have you been treating your wound(s) until nowo AD ointment Has your wound(s) ever healed and then re-openedo No Have you had any lab work done in the past montho No Have you tested positive for an antibiotic resistant organism (MRSA, VRE)o No Have you tested positive for osteomyelitis (bone infection)o No Have you had any tests for circulation on your legso No Eyes Complaints and Symptoms: Positive for: Glasses / Contacts Medical History: Positive for: Cataracts - bilateral Negative for: Glaucoma; Optic  Neuritis Ear/Nose/Mouth/Throat Complaints and Symptoms: Positive for: Difficult clearing ears - hearing loss Medical History: Negative for: Chronic sinus problems/congestion; Middle ear problems Cardiovascular Complaints and Symptoms: Positive for: LE edema Medical History: Positive for: Hypertension Negative for: Angina; Arrhythmia; Congestive Heart Failure; Coronary Artery Disease; Deep Vein Thrombosis; Hypotension; Myocardial Infarction; Peripheral Arterial Disease; Peripheral Venous Disease; Phlebitis; Vasculitis Past Medical History Notes: Palpitations Hyperlipidema Endocrine Complaints and Symptoms: Positive for: Thyroid disease - Hypothyroidism Negative for: Polydypsia (Excessive Thirst) HADASSAH, RANA. ([id number]) Medical History: Negative for: Type I Diabetes; Type II Diabetes Musculoskeletal Complaints and Symptoms: Positive for: Muscle Weakness Medical History: Negative for: Gout; Rheumatoid Arthritis; Osteoarthritis; Osteomyelitis Neurologic Complaints and Symptoms: No Complaints or Symptoms Complaints and Symptoms: Negative for: Numbness/parasthesias; Focal/Weakness Medical History: Negative for: Dementia; Neuropathy; Quadriplegia; Paraplegia; Seizure Disorder Constitutional Symptoms (General Health) Complaints and Symptoms: No Complaints or Symptoms Hematologic/Lymphatic Complaints and Symptoms: No Complaints or Symptoms Complaints and Symptoms: Review of System Notes: Thrombocytopenia Medical History: Positive for: Anemia Negative for: Hemophilia; Human Immunodeficiency Virus; Lymphedema; Sickle Cell Disease Respiratory Complaints and Symptoms: No Complaints or Symptoms Medical History: Negative for: Aspiration; Asthma; Chronic Obstructive Pulmonary Disease (COPD); Pneumothorax; Tuberculosis Gastrointestinal Complaints and Symptoms: No Complaints or Symptoms Medical History: Negative for: Cirrhosis ; Colitis; Crohnos; Hepatitis A; Hepatitis B;  Hepatitis C Genitourinary Complaints and Symptoms: No Complaints or Symptoms Lastinger, Kayelee D. ([id number]) Medical History: Negative for: End Stage Renal Disease Immunological Complaints and Symptoms: No Complaints or Symptoms Medical History: Negative for: Lupus Erythematosus; Raynaudos; Scleroderma Integumentary (Skin) Complaints and Symptoms: No Complaints or Symptoms Medical History: Negative for: History of Burn; History of pressure wounds Oncologic Complaints and Symptoms: No Complaints or Symptoms Medical History: Positive for: Received Chemotherapy - lymphocytic leukemia Negative for: Received Radiation Psychiatric Complaints and Symptoms: No Complaints or Symptoms Medical History: Negative for: Anorexia/bulimia; Confinement Anxiety HBO Extended History Items Eyes: Cataracts Immunizations Pneumococcal Vaccine: Received Pneumococcal Vaccination: No Tetanus Vaccine: Last tetanus shot: 01/19/1949 Implantable Devices None Family and Social History Cancer: Yes - Father; Diabetes: No; Heart Disease: Yes - Mother; Hereditary Spherocytosis: No; Hypertension: No; Kidney Disease: No; Lung Disease: No; Seizures: No; Stroke: No; Thyroid Problems: No; Tuberculosis: No; Never smoker; Marital Status - Widowed; Alcohol Use: Never; Drug Use: No History; Caffeine Use: Daily - coffee soda; Financial Concerns: No; Food, Clothing or Shelter Needs: No; Support System Lacking: No; Transportation Concerns: No; Advanced Directives: No; Living Will: No; Medical Power of Attorney: No AAILYAH, DUNBAR ([id number]) Electronic Signature(s) Signed: 03/16/2018 4:42:30 PM By: Linton Ham MD Signed: 03/17/2018 7:54:32 AM By: Harold Barban Entered By: Harold Barban on 03/16/2018 10:30:27 ASHLEEN, DEMMA ([id number]) -------------------------------------------------------------------------------- SuperBill Details Patient Name: Heather [name] Date of Service: 03/16/2018 Medical Record Number:  [id number] Patient Account Number: [id number] Date of Birth/Sex: 12-Nov-1928 (85 y.o. 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 Facility Procedures CPT4 Code: 11552080 Description: [place] VISIT-LEV 3 EST PT Modifier: Quantity: 1 CPT4 Code: 22336122 Description: 11105-Punch biopsy of skin each separate/additional lesion ICD-10 Diagnosis Description K62.6 Ulcer of anus and rectum Modifier: Quantity: 1 Physician Procedures CPT4 Code: 4497530 Description: 05110 - WC PHYS LEVEL 4 - NEW PT ICD-10 Diagnosis Description K62.6 Ulcer of anus and rectum L98.499 Non-pressure chronic ulcer of skin of other sites with unspec C91.10 Chronic lymphocytic leukemia of B-cell type not having achiev Modifier: 25 ified severity ed remission Quantity: 1 CPT4 Code: 11105 Description: Punch biopsy of skin each separate/additional lesion ICD-10 Diagnosis Description K62.6 Ulcer of anus and rectum Modifier: Quantity: 1 Electronic Signature(s) Signed: 03/16/2018 4:13:09 PM By: Gretta Cool, BSN, RN, CWS, Kim RN, BSN Signed: 03/16/2018 4:42:30 PM By: Linton Ham MD Entered By: Gretta Cool, BSN, RN, CWS, Kim on 03/16/2018 16:13:09

## 2018-03-29 ENCOUNTER — Other Ambulatory Visit
Admission: RE | Admit: 2018-03-29 | Discharge: 2018-03-29 | Disposition: A | Discharge status: Home / Self Care | Payer: Medicare Other | Source: Skilled Nursing Facility | Attending: Nurse Practitioner | Admitting: Nurse Practitioner

## 2018-03-29 ENCOUNTER — Other Ambulatory Visit: Payer: Self-pay

## 2018-03-29 ENCOUNTER — Encounter: Payer: Self-pay | Admitting: Internal Medicine

## 2018-03-29 ENCOUNTER — Inpatient Hospital Stay: Payer: Medicare Other | Attending: Internal Medicine

## 2018-03-29 ENCOUNTER — Inpatient Hospital Stay (HOSPITAL_BASED_OUTPATIENT_CLINIC_OR_DEPARTMENT_OTHER): Payer: Medicare Other | Admitting: Internal Medicine

## 2018-03-29 VITALS — BP 137/55 | HR 93 | Temp 97.9°F | Resp 20 | Ht 62.0 in | Wt 134.0 lb

## 2018-03-29 DIAGNOSIS — R630 Anorexia: Secondary | ICD-10-CM

## 2018-03-29 DIAGNOSIS — L98499 Non-pressure chronic ulcer of skin of other sites with unspecified severity: Secondary | ICD-10-CM | POA: Diagnosis not present

## 2018-03-29 DIAGNOSIS — B009 Herpesviral infection, unspecified: Secondary | ICD-10-CM | POA: Insufficient documentation

## 2018-03-29 DIAGNOSIS — R634 Abnormal weight loss: Secondary | ICD-10-CM | POA: Diagnosis not present

## 2018-03-29 DIAGNOSIS — L98419 Non-pressure chronic ulcer of buttock with unspecified severity: Secondary | ICD-10-CM | POA: Insufficient documentation

## 2018-03-29 DIAGNOSIS — E039 Hypothyroidism, unspecified: Secondary | ICD-10-CM | POA: Insufficient documentation

## 2018-03-29 DIAGNOSIS — C8300 Small cell B-cell lymphoma, unspecified site: Secondary | ICD-10-CM | POA: Diagnosis not present

## 2018-03-29 DIAGNOSIS — C9110 Chronic lymphocytic leukemia of B-cell type not having achieved remission: Secondary | ICD-10-CM

## 2018-03-29 DIAGNOSIS — R269 Unspecified abnormalities of gait and mobility: Secondary | ICD-10-CM

## 2018-03-29 DIAGNOSIS — D696 Thrombocytopenia, unspecified: Secondary | ICD-10-CM | POA: Diagnosis not present

## 2018-03-29 DIAGNOSIS — D72819 Decreased white blood cell count, unspecified: Secondary | ICD-10-CM

## 2018-03-29 DIAGNOSIS — M7989 Other specified soft tissue disorders: Secondary | ICD-10-CM

## 2018-03-29 DIAGNOSIS — R0609 Other forms of dyspnea: Secondary | ICD-10-CM

## 2018-03-29 LAB — BASIC METABOLIC PANEL
Anion gap: 6 (ref 5–15)
BUN: 13 mg/dL (ref 8–23)
CO2: 26 mmol/L (ref 22–32)
Calcium: 9.5 mg/dL (ref 8.9–10.3)
Chloride: 103 mmol/L (ref 98–111)
Creatinine, Ser: 0.86 mg/dL (ref 0.44–1.00)
Glucose, Bld: 118 mg/dL — ABNORMAL HIGH (ref 70–99)
Potassium: 4 mmol/L (ref 3.5–5.1)
SODIUM: 135 mmol/L (ref 135–145)

## 2018-03-29 LAB — CBC WITH DIFFERENTIAL/PLATELET
Abs Immature Granulocytes: 0.04 10*3/uL (ref 0.00–0.07)
BASOS ABS: 0 10*3/uL (ref 0.0–0.1)
Basophils Relative: 1 %
Eosinophils Absolute: 0 10*3/uL (ref 0.0–0.5)
Eosinophils Relative: 1 %
HCT: 30.9 % — ABNORMAL LOW (ref 36.0–46.0)
Hemoglobin: 9.8 g/dL — ABNORMAL LOW (ref 12.0–15.0)
IMMATURE GRANULOCYTES: 1 %
Lymphocytes Relative: 59 %
Lymphs Abs: 2 10*3/uL (ref 0.7–4.0)
MCH: 28.4 pg (ref 26.0–34.0)
MCHC: 31.7 g/dL (ref 30.0–36.0)
MCV: 89.6 fL (ref 80.0–100.0)
Monocytes Absolute: 0.5 10*3/uL (ref 0.1–1.0)
Monocytes Relative: 13 %
NEUTROS PCT: 25 %
NRBC: 1.7 % — AB (ref 0.0–0.2)
Neutro Abs: 0.9 10*3/uL — ABNORMAL LOW (ref 1.7–7.7)
PLATELETS: 102 10*3/uL — AB (ref 150–400)
RBC: 3.45 MIL/uL — ABNORMAL LOW (ref 3.87–5.11)
RDW: 18.4 % — ABNORMAL HIGH (ref 11.5–15.5)
WBC: 3.5 10*3/uL — AB (ref 4.0–10.5)

## 2018-03-29 LAB — LACTATE DEHYDROGENASE: LDH: 291 U/L — ABNORMAL HIGH (ref 98–192)

## 2018-03-29 MED ORDER — AMOXICILLIN 500 MG PO TABS: 500.0000 mg | ORAL_TABLET | Freq: Two times a day (BID) | ORAL | 0 refills | Status: AC

## 2018-03-29 NOTE — Assessment & Plan Note (Addendum)
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 PET scan OCT 2019- moderate splenomegaly; slightly increased in size [compared to CT scan July 2019]; January 2020 MRI-approximately 11 cm splenic lesion/chronic but slowly growing; also about inch in size splenic lesion-new.   #Status post venetoclax 20 mg x 1 week [finished feb 24th]; tolerated well without any potential signs of tumor lysis.  Hold further therapy given perineal ulceration.  See discussion below  #Perianal ulceration-Punch biopsy performed by nurse practitioner/see below.  Await biopsy results.  #Mild leukopenia/mild thrombocytopenia-normal hemoglobin-secondary to CLL-stable hold venetoclax given above wound healing issues.  #Weight loss of 20 pounds over the last 3 to 4 months.stable discontinue prednisone given above wounds  # Hypothyroidism-on Synthroid TSH slightly elevated T4 normal.  #Gait instability/debility/wound care issues-continue home health.  #Follow-up to be decided.

## 2018-03-29 NOTE — Progress Notes (Addendum)
[place] OFFICE PROGRESS NOTE  Patient Care Team: Albina Billet, MD as PCP - General (Internal Medicine)  Cancer Staging No matching staging information was found for the patient.   Oncology History   1. Ultrasound revealed abdominal lymphadenopathy(March, 2013) 2. Biopsy as well as peripheral blood be suggestive of chronic lymphocytic leukemia (April, 2013) 3. Started on bendamustin and Rituxan from May of 2013 4. Has finished her 6 cycles of chemotherapy with Rituxan and bendamustine (October 26, 2011) 5.recurrent and progressive disease with progressive anemia.  Patient was started on IBRUTANIB (March, 2016)  taking 140 mg 3 pills a day  6. Patient was taken off IBRUTANIB in August of 2016 because of persistent myelosuppression even with reducing dose 7. Because of rising WBC count and anemia patient would be started back on a lower dose of improved on ibrutanib 140 mg 2 tablets a day; JAN 2020- stop Ibrutinib- sec to progression; BMBx- CLL; kayroptype-N; FISH-pending.   # FEB 18th 2020- start Venatoclax.  -----------------------------------------------------   DIAGNOSIS: [ ]  CLL  STAGE: 4 ; GOALS: Control  CURRENT/MOST RECENT THERAPY- Venatoclax.       Chronic lymphocytic leukemia ([place])   06/06/2014 Initial Diagnosis    Chronic lymphocytic leukemia     INTERVAL HISTORY:  Heather [name] 85 y.o.  female pleasant patient above history of CLL most currently on venetoclax 20mg /day [started 2/18]-while hospital.  Patient was recently evaluated for a perineal ulceration.  She was evaluated by wound care and also surgery.  Etiology unclear.  She is here for follow-up.   Review of Systems  Constitutional: Positive for malaise/fatigue and weight loss. Negative for chills, diaphoresis and fever.  HENT: Negative for nosebleeds and sore throat.   Eyes: Negative for double vision.  Respiratory: Positive for shortness of breath (Shortness of breath only with  exertion.). Negative for cough, hemoptysis, sputum production and wheezing.   Cardiovascular: Negative for chest pain, palpitations, orthopnea and leg swelling.  Gastrointestinal: Negative for abdominal pain, blood in stool, constipation, diarrhea, heartburn, melena, nausea and vomiting.  Genitourinary: Negative for dysuria, frequency and urgency.  Musculoskeletal: Negative for back pain and joint pain.  Skin: Negative.  Negative for itching and rash.       Large area of ulceration between the 2 buttocks and in the perianal region.  Foul-smelling discharge.  Neurological: Negative for dizziness, tingling, focal weakness, weakness and headaches.  Endo/Heme/Allergies: Does not bruise/bleed easily.  Psychiatric/Behavioral: Negative for depression. The patient is not nervous/anxious and does not have insomnia.       PAST MEDICAL HISTORY :  Past Medical History:  Diagnosis Date  . Anemia   . Cataracts, bilateral   . Chronic lymphocytic leukemia ([place]) 06/06/2014  . Hearing loss   . History of chemotherapy   . Hyperlipidemia   . Hypertension   . Hypothyroidism   . Palpitations   . Risk for falls   . Thrombocytopenia ([place])     PAST SURGICAL HISTORY :   Past Surgical History:  Procedure Laterality Date  . CATARACT EXTRACTION Left 03/29/2012  . Excision left cervical node biopsy  05/04/2011    FAMILY HISTORY :   Family History  Problem Relation Age of Onset  . Heart disease Mother   . Stomach cancer Father   . Diabetes Sister     SOCIAL HISTORY:   Social History   Tobacco Use  . Smoking status: Never Smoker  . Smokeless tobacco: Never Used  Substance Use Topics  . Alcohol  use: No    Alcohol/week: 0.0 standard drinks  . Drug use: No    ALLERGIES:  is allergic to losartan.  MEDICATIONS:  Current Outpatient Medications  Medication Sig Dispense Refill  . allopurinol (ZYLOPRIM) 300 MG tablet Take 1 tablet (300 mg total) by mouth 2 (two) times daily. 120 tablet 0  .  levothyroxine (SYNTHROID, LEVOTHROID) 75 MCG tablet Take 75 mcg by mouth daily before breakfast. Only takes Saturday- Thursday. Hold synthroid on Friday    . metoprolol tartrate (LOPRESSOR) 25 MG tablet Take 1 tablet (25 mg total) by mouth 2 (two) times daily. 60 tablet 0  . valACYclovir (VALTREX) 1000 MG tablet Take 1 tablet (1,000 mg total) by mouth 2 (two) times daily for 10 days. 20 tablet 0  . venetoclax (VENCLEXTA STARTING PACK) 10 & 50 & 100 MG TBPK Take 20mg  by mouth daily for week one, 50mg  daily for week 2, 100mg  daily for week 3, 200mg  daily for week 4. Take as directed. (Patient not taking: Reported on 03/29/2018) 42 each 0  . venetoclax (VENCLEXTA) 10 MG TABS Take 20 mg by mouth daily. Take with food and a full glass of water. (Patient not taking: Reported on 03/29/2018) 14 tablet 0   No current facility-administered medications for this visit.     PHYSICAL EXAMINATION: ECOG PERFORMANCE STATUS: 2 - Symptomatic, <50% confined to bed  BP (!) 137/55 (BP Location: Left Arm, Patient Position: Sitting)   Pulse 93   Temp 97.9 F (36.6 C) (Tympanic)   Resp 20   Ht 5\' 2"  (1.575 m)   Wt 134 lb (60.8 kg)   BMI 24.51 kg/m   Filed Weights   03/29/18 1314  Weight: 134 lb (60.8 kg)    Physical Exam  Constitutional: She is oriented to person, place, and time and well-developed, well-nourished, and in no distress.  Elderly frail female patient she is walking with a walker.  Accompanied by her sister.  HENT:  Head: Normocephalic and atraumatic.  Mouth/Throat: Oropharynx is clear and moist. No oropharyngeal exudate.  Eyes: Pupils are equal, round, and reactive to light.  Neck: Normal range of motion. Neck supple.  Cardiovascular: Normal rate and regular rhythm.  Pulmonary/Chest: No respiratory distress. She has no wheezes.  Abdominal: Soft. Bowel sounds are normal. She exhibits no distension and no mass. There is no abdominal tenderness. There is no rebound and no guarding.   Musculoskeletal: Normal range of motion.        General: No tenderness or edema.     Comments: Mild bilateral leg swelling.  Neurological: She is alert and oriented to person, place, and time.  Skin: Skin is warm.  Psychiatric: Affect normal.   Procedure note:  Skin lesion punch biopsy The risks and benefits of the procedure were reviewed and informed consent obtained including risks including but not limited to: bleeding, pain, infection, need for further surgery/procedures. Time out was performed. The patient received pre-procedure teaching and expressed understanding. The post-procedure instructions were reviewed with the patient and she expressed understanding. The patient does not have any barriers to learning. After obtaining informed consent, the area was prepped in usual fashion.  Anesthesia was obtained with 1% lidocaine.  A full-thickness punch biopsy was obtained with a 3 mm punch. Silver nitrate was applied and pressure dressing applied until hemostasis was achieved. Clean, dry dressing applied to area. Specimen sent to pathology.   LABORATORY DATA:  I have reviewed the data as listed    Component Value Date/Time  NA 135 03/29/2018 1253   NA 133 (L) 05/08/2014 1349   K 4.0 03/29/2018 1253   K 3.6 05/08/2014 1349   CL 103 03/29/2018 1253   CL 102 05/08/2014 1349   CO2 26 03/29/2018 1253   CO2 26 05/08/2014 1349   GLUCOSE 118 (H) 03/29/2018 1253   GLUCOSE 82 05/08/2014 1349   BUN 13 03/29/2018 1253   BUN 18 05/08/2014 1349   CREATININE 0.86 03/29/2018 1253   CREATININE 1.13 (H) 05/08/2014 1349   CALCIUM 9.5 03/29/2018 1253   CALCIUM 9.1 05/08/2014 1349   PROT 5.6 (L) 03/15/2018 0944   PROT 6.7 05/08/2014 1349   ALBUMIN 3.1 (L) 03/15/2018 0944   ALBUMIN 4.2 05/08/2014 1349   AST 45 (H) 03/15/2018 0944   AST 24 05/08/2014 1349   ALT 34 03/15/2018 0944   ALT 13 (L) 05/08/2014 1349   ALKPHOS 31 (L) 03/15/2018 0944   ALKPHOS 41 05/08/2014 1349   BILITOT 1.7 (H)  03/15/2018 0944   BILITOT 1.3 (H) 05/08/2014 1349   GFRNONAA >60 03/29/2018 1253   GFRNONAA 45 (L) 05/08/2014 1349   GFRAA >60 03/29/2018 1253   GFRAA 52 (L) 05/08/2014 1349    No results found for: SPEP, UPEP  Lab Results  Component Value Date   WBC 3.5 (L) 03/29/2018   NEUTROABS 0.9 (L) 03/29/2018   HGB 9.8 (L) 03/29/2018   HCT 30.9 (L) 03/29/2018   MCV 89.6 03/29/2018   PLT 102 (L) 03/29/2018      Chemistry      Component Value Date/Time   NA 135 03/29/2018 1253   NA 133 (L) 05/08/2014 1349   K 4.0 03/29/2018 1253   K 3.6 05/08/2014 1349   CL 103 03/29/2018 1253   CL 102 05/08/2014 1349   CO2 26 03/29/2018 1253   CO2 26 05/08/2014 1349   BUN 13 03/29/2018 1253   BUN 18 05/08/2014 1349   CREATININE 0.86 03/29/2018 1253   CREATININE 1.13 (H) 05/08/2014 1349      Component Value Date/Time   CALCIUM 9.5 03/29/2018 1253   CALCIUM 9.1 05/08/2014 1349   ALKPHOS 31 (L) 03/15/2018 0944   ALKPHOS 41 05/08/2014 1349   AST 45 (H) 03/15/2018 0944   AST 24 05/08/2014 1349   ALT 34 03/15/2018 0944   ALT 13 (L) 05/08/2014 1349   BILITOT 1.7 (H) 03/15/2018 0944   BILITOT 1.3 (H) 05/08/2014 1349       RADIOGRAPHIC STUDIES: I have personally reviewed the radiological images as listed and agreed with the findings in the report. No results found.   ASSESSMENT & PLAN:  Chronic lymphocytic leukemia ([place]) # CLL-most recently on Ibrutinib. PET scan OCT 2019- moderate splenomegaly; slightly increased in size [compared to CT scan July 2019]; January 2020 MRI-approximately 11 cm splenic lesion/chronic but slowly growing; also about inch in size splenic lesion-new.   #Status post venetoclax 20 mg x 1 week [finished feb 24th]; tolerated well without any potential signs of tumor lysis.  Hold further therapy given perineal ulceration.  See discussion below  #Perianal ulceration-Punch biopsy performed by nurse practitioner/see below.  Await biopsy results.  #Mild leukopenia/mild  thrombocytopenia-normal hemoglobin-secondary to CLL-stable hold venetoclax given above wound healing issues.  #Weight loss of 20 pounds over the last 3 to 4 months.stable discontinue prednisone given above wounds  # Hypothyroidism-on Synthroid TSH slightly elevated T4 normal.  #Gait instability/debility/wound care issues-continue home health.  #Follow-up to be decided.    Buttock/perianal Wound- etiology unclear- Questioning if  secondary to incontinence. Per surgery, images reviewed by derm and gi with possible infectious etiologies. Viral cultures to evaluate for HSV, EBV, and CMV today. Punch biopsy performed of right perianal wound at 3:00 and sent to pathology. Amoxil for antibiotic prophylaxis sent to pharmacy. Tylenol for pain. Continue to follow up with wound care. Will have patient rtc in 5 days to re-evaluate.   Orders Placed This Encounter  Procedures  . Viral culture    Standing Status:   Future    Number of Occurrences:   1    Standing Expiration Date:   03/29/2019   All questions were answered. The patient knows to call the clinic with any problems, questions or concerns.     Beckey Rutter, DNP, AGNP-C [facility] (work cell) [phone] (office)   CC: Dr. Rogue Bussing

## 2018-03-29 NOTE — Patient Instructions (Addendum)
Please keep area clean and dry for next 48 hours. Avoid touching and contaminating the area. After each bowel movement, please clean thoroughly with soap and water and pat dry. If the area re-bleeds, hold pressure. I have sent a prescription to your pharmacy. Please start that today. Please let me know if you have any concerns or questions. It was a pleasure meeting you today and thank you for allowing me to participate in your care. -Beckey Rutter, NP   Punch Biopsy of the Skin  What is punch biopsy? Punch biopsy is a commonly performed diagnostic procedure on abnormal skin growths or skin tumors. It is performed using a local anesthetic (numbing medicine). A pencil-like instrument is used to remove a small, thin cylinder of tissue. The small hole in the skin then may be sutured (stitched) closed.  What happens to the biopsy specimen once it is removed? After removal, the biopsy specimen is sent to the laboratory for further evaluation. The specimen is examined under a microscope by a subspecialist doctor known as a pathologist. The pathologist is trained to correctly identify the cells of various skin growths, which will assist your doctor in selecting the proper treatment.  Are there any complications after punch biopsy? Complications are uncommon following this simple procedure but can occur with any surgical procedure. Some of the complications associated with punch biopsy include local bleeding and bruising, pain, infection, allergic reaction to the numbing medicine used in the procedure, or damage to the structures beneath the skin site (such as an artery or a nerve). Your doctor will take care to reduce the likelihood of these rare problems.  What happens to the site where the piece of skin was removed? The biopsy site may be sutured (stitched) closed, depending on the size of the skin defect. The area often heals with a small scar. Your doctor may ask you to return in 5 to 14 days for removal  of the stitches. You will be given instructions on how to help the biopsy site heal. The results of the biopsy evaluation will determine if further treatment of the skin site will be needed.  How long before I will receive the results of the biopsy evaluation? The biopsy results usually are available in one to two weeks. Your doctor?s office will notify you of the results. You do not need to call the office in the first two weeks after the procedure. Sometimes the doctor will review the results with you at the follow-up (stitch removal) visit. If 1 month goes by and you have not heard from your doctor, call the office for the results of the biopsy.  Following Punch Biopsy of the Skin Immediately after removal of the skin biopsy specimen and closure of the biopsy site, your doctor will apply antibiotic ointment and a bandage to the site. Continue to apply antibiotic ointment to the wound until it is completely healed. The antibiotic ointment Mycitracin Plus is recommended because it contains numbing medicine in addition to the antibiotic.  You can remove the bandage at any time, but you may prefer to keep the wound covered. Keeping the site covered with a bandage may prevent rubbing at the site and will also keep the antibiotic ointment off your clothing.  If the biopsy site begins to bleed, apply direct pressure for 10 minutes. If it continues to bleed, call your doctor.  If you experience discomfort at the biopsy site, you can take ibuprofen (brand names: Advil, Motrin), three 200-mg tablets 3 times a  day with food, or acetaminophen (brand name: Tylenol), two 325-mg tablets every 6 hours.  Skin infection can follow any surgical procedure. If you develop increased pain, redness, pus or swelling at the biopsy site, call your doctor.  Most doctors use the suture removal visit to discuss with you the pathology results of the biopsy, if they are available. It usually takes 1 to 2 weeks for your doctor to  receive the results of your biopsy. The doctor's office will contact you with the results. If 1 month goes by and you have not heard from your doctor's office, call to check on the biopsy results.  Am Fam Physician. 2002 Mar 15;65(6):1167-1168.

## 2018-03-30 ENCOUNTER — Telehealth: Payer: Self-pay | Admitting: *Deleted

## 2018-03-30 ENCOUNTER — Encounter: Payer: Medicare Other | Attending: Internal Medicine | Admitting: Internal Medicine

## 2018-03-30 DIAGNOSIS — C9110 Chronic lymphocytic leukemia of B-cell type not having achieved remission: Secondary | ICD-10-CM | POA: Insufficient documentation

## 2018-03-30 DIAGNOSIS — I10 Essential (primary) hypertension: Secondary | ICD-10-CM | POA: Insufficient documentation

## 2018-03-30 DIAGNOSIS — K626 Ulcer of anus and rectum: Secondary | ICD-10-CM | POA: Insufficient documentation

## 2018-03-30 NOTE — Telephone Encounter (Signed)
Right buttock at 3:00.

## 2018-03-30 NOTE — Telephone Encounter (Signed)
Path department informed

## 2018-03-30 NOTE — Telephone Encounter (Signed)
Received specimen, order states it is form right buttock, specimen jar label states left buttock. Please call to clarify which side it is from. [phone]

## 2018-03-30 NOTE — Telephone Encounter (Signed)
Notified Pam that label should say right buttock, instead of left. Thanks!

## 2018-03-31 ENCOUNTER — Other Ambulatory Visit: Payer: Self-pay | Admitting: Nurse Practitioner

## 2018-03-31 ENCOUNTER — Encounter: Payer: Self-pay | Admitting: Nurse Practitioner

## 2018-03-31 LAB — MISC LABCORP TEST (SEND OUT)

## 2018-03-31 NOTE — Progress Notes (Signed)
AILYNN, GOW ([id number]) Visit Report for 03/30/2018 Arrival Information Details Patient Name: Heather [name], Heather [name] Date of Service: 03/30/2018 10:00 AM Medical Record Number: [id number] Patient Account Number: [id number] Date of Birth/Sex: 1928/04/13 (85 y.o. F) Treating RN: Montey Hora Primary Care [place]: Benita Stabile Other Clinician: Referring Gaius Ishaq: Benita Stabile Treating Shadasia Oldfield/Extender: Tito Dine in Treatment: 2 Visit Information History Since Last Visit Added or deleted any medications: No Patient Arrived: Walker Any new allergies or adverse reactions: No Arrival Time: 10:10 Had a fall or experienced change in No Accompanied By: daughter activities of daily living that may affect Transfer Assistance: None risk of falls: Patient Identification Verified: Yes Signs or symptoms of abuse/neglect since last visito No Secondary Verification Process Completed: Yes Hospitalized since last visit: No Implantable device outside of the clinic excluding No cellular tissue based products placed in the center since last visit: Has Dressing in Place as Prescribed: Yes Pain Present Now: No Electronic Signature(s) Signed: 03/30/2018 4:23:17 PM By: Montey Hora Entered By: Montey Hora on 03/30/2018 10:13:16 Engh, Heather [name] ([id number]) -------------------------------------------------------------------------------- Clinic Level of Care Assessment Details Patient Name: Heather [name] Date of Service: 03/30/2018 10:00 AM Medical Record Number: [id number] Patient Account Number: [id number] Date of Birth/Sex: 1928/10/11 (85 y.o. F) Treating RN: Cornell Barman Primary Care [place]on Kilmer: Benita Stabile Other Clinician: Referring Taliana Mersereau: Benita Stabile Treating Delisha Peaden/Extender: Tito Dine in Treatment: 2 Clinic Level of Care Assessment Items TOOL 4 Quantity Score []  - Use when only an EandM is performed on FOLLOW-UP visit 0 ASSESSMENTS - Nursing Assessment /  Reassessment []  - Reassessment of Co-morbidities (includes updates in patient status) 0 X- 1 5 Reassessment of Adherence to Treatment Plan ASSESSMENTS - Wound and Skin Assessment / Reassessment []  - Simple Wound Assessment / Reassessment - one wound 0 X- 3 5 Complex Wound Assessment / Reassessment - multiple wounds []  - 0 Dermatologic / Skin Assessment (not related to wound area) ASSESSMENTS - Focused Assessment []  - Circumferential Edema Measurements - multi extremities 0 []  - 0 Nutritional Assessment / Counseling / Intervention []  - 0 Lower Extremity Assessment (monofilament, tuning fork, pulses) []  - 0 Peripheral Arterial Disease Assessment (using hand held doppler) ASSESSMENTS - Ostomy and/or Continence Assessment and Care []  - Incontinence Assessment and Management 0 []  - 0 Ostomy Care Assessment and Management (repouching, etc.) PROCESS - Coordination of Care X - Simple Patient / Family Education for ongoing care 1 15 []  - 0 Complex (extensive) Patient / Family Education for ongoing care X- 1 10 Staff obtains Programmer, systems, Records, Test Results / Process Orders []  - 0 Staff telephones HHA, Nursing Homes / Clarify orders / etc []  - 0 Routine Transfer to another Facility (non-emergent condition) []  - 0 Routine Hospital Admission (non-emergent condition) []  - 0 New Admissions / Biomedical engineer / Ordering NPWT, Apligraf, etc. []  - 0 Emergency Hospital Admission (emergent condition) X- 1 10 Simple Discharge Coordination Heather [name], Heather D. ([id number]) []  - 0 Complex (extensive) Discharge Coordination PROCESS - Special Needs []  - Pediatric / Minor Patient Management 0 []  - 0 Isolation Patient Management []  - 0 Hearing / Language / Visual special needs []  - 0 Assessment of Community assistance (transportation, D/C planning, etc.) []  - 0 Additional assistance / Altered mentation []  - 0 Support Surface(s) Assessment (bed, cushion, seat, etc.) INTERVENTIONS - Wound  Cleansing / Measurement []  - Simple Wound Cleansing - one wound 0 X- 3 5 Complex Wound Cleansing - multiple wounds X- 1 5 Wound Imaging (photographs - any  number of wounds) []  - 0 Wound Tracing (instead of photographs) []  - 0 Simple Wound Measurement - one wound X- 3 5 Complex Wound Measurement - multiple wounds INTERVENTIONS - Wound Dressings []  - Small Wound Dressing one or multiple wounds 0 []  - 0 Medium Wound Dressing one or multiple wounds []  - 0 Large Wound Dressing one or multiple wounds X- 1 5 Application of Medications - topical []  - 0 Application of Medications - injection INTERVENTIONS - Miscellaneous []  - External ear exam 0 []  - 0 Specimen Collection (cultures, biopsies, blood, body fluids, etc.) []  - 0 Specimen(s) / Culture(s) sent or taken to Lab for analysis []  - 0 Patient Transfer (multiple staff / Civil Service fast streamer / Similar devices) []  - 0 Simple Staple / Suture removal (25 or less) []  - 0 Complex Staple / Suture removal (26 or more) []  - 0 Hypo / Hyperglycemic Management (close monitor of Blood Glucose) []  - 0 Ankle / Brachial Index (ABI) - do not check if billed separately X- 1 5 Vital Signs Borquez, Beverly D. ([id number]) Has the patient been seen at the hospital within the last three years: Yes Total Score: 100 Level Of Care: New/Established - Level 3 Electronic Signature(s) Signed: 03/30/2018 5:23:21 PM By: Gretta Cool, BSN, RN, CWS, Kim RN, BSN Entered By: Gretta Cool, BSN, RN, CWS, Kim on 03/30/2018 10:39:31 Heather [name], Heather [name] ([id number]) -------------------------------------------------------------------------------- Encounter Discharge Information Details Patient Name: Heather [name] Date of Service: 03/30/2018 10:00 AM Medical Record Number: [id number] Patient Account Number: [id number] Date of Birth/Sex: 1928-07-19 (85 y.o. F) Treating RN: Army Melia Primary Care Solveig Fangman: Benita Stabile Other Clinician: Referring Alayzia Pavlock: Benita Stabile Treating Sibel Khurana/Extender:  Tito Dine in Treatment: 2 Encounter Discharge Information Items Discharge Condition: Stable Ambulatory Status: Walker Discharge Destination: Home Transportation: Private Auto Accompanied By: sister Schedule Follow-up Appointment: Yes Clinical Summary of Care: Electronic Signature(s) Signed: 03/30/2018 11:45:59 AM By: Army Melia Entered By: Army Melia on 03/30/2018 10:48:06 Dzik, Heather [name] ([id number]) -------------------------------------------------------------------------------- Lower Extremity Assessment Details Patient Name: Heather [name] Date of Service: 03/30/2018 10:00 AM Medical Record Number: [id number] Patient Account Number: [id number] Date of Birth/Sex: 06/22/1928 (85 y.o. F) Treating RN: Montey Hora Primary Care Elynor Kallenberger: Benita Stabile Other Clinician: Referring Armilda Vanderlinden: Benita Stabile Treating Atreyu Mak/Extender: Ricard Dillon Weeks in Treatment: 2 Electronic Signature(s) Signed: 03/30/2018 4:23:17 PM By: Montey Hora Entered By: Montey Hora on 03/30/2018 10:25:53 Heather [name], Heather [name] ([id number]) -------------------------------------------------------------------------------- Multi Wound Chart Details Patient Name: Heather [name] Date of Service: 03/30/2018 10:00 AM Medical Record Number: [id number] Patient Account Number: [id number] Date of Birth/Sex: December 24, 1928 (85 y.o. F) Treating RN: Cornell Barman Primary Care Griffen Frayne: Benita Stabile Other Clinician: Referring Damichael Hofman: Benita Stabile Treating Jaymie Mckiddy/Extender: Tito Dine in Treatment: 2 Vital Signs Height(in): 62 Pulse(bpm): 49 Weight(lbs): 135 Blood Pressure(mmHg): 130/40 Body Mass Index(BMI): 25 Temperature(F): 98.4 Respiratory Rate 14 (breaths/min): Photos: Wound Location: Left Peri-anal - Proximal Left Peri-anal - Distal Right Peri-anal Wounding Event: Gradually Appeared Gradually Appeared Gradually Appeared Primary Etiology: Atypical Atypical Atypical Comorbid History:  Cataracts, Anemia, Cataracts, Anemia, Cataracts, Anemia, Hypertension, Received Hypertension, Received Hypertension, Received Chemotherapy Chemotherapy Chemotherapy Date Acquired: 12/19/2017 12/19/2017 12/19/2017 Weeks of Treatment: 2 2 2  Wound Status: Open Open Open Clustered Wound: No Yes No Clustered Quantity: N/A 1 N/A Measurements L x W x D 1x0.9x0.1 1.8x1.2x0.1 4x4.5x0.2 (cm) Area (cm) : 0.707 1.696 14.137 Volume (cm) : 0.071 0.17 2.827 % Reduction in Area: -125.20% 92.80% 20.00% % Reduction in Volume: -129.00% 92.80% -60.00% Classification: Full Thickness Without Full  Thickness Without Full Thickness Without Exposed Support Structures Exposed Support Structures Exposed Support Structures Exudate Amount: Medium Medium Medium Exudate Type: Serous Serous Serous Exudate Color: amber amber amber Wound Margin: Flat and Intact Flat and Intact Flat and Intact Granulation Amount: Large (67-100%) Large (67-100%) Medium (34-66%) Granulation Quality: Pink Red Pink Necrotic Amount: Small (1-33%) Small (1-33%) Medium (34-66%) Exposed Structures: Fat Layer (Subcutaneous Fat Layer (Subcutaneous Fat Layer (Subcutaneous Tissue) Exposed: Yes Tissue) Exposed: Yes Tissue) Exposed: Yes Fascia: No Fascia: No Fascia: No Napoleon, Heather D. ([id number]) Tendon: No Tendon: No Tendon: No Muscle: No Muscle: No Muscle: No Joint: No Joint: No Joint: No Bone: No Bone: No Bone: No Epithelialization: Medium (34-66%) Small (1-33%) Small (1-33%) Periwound Skin Texture: Excoriation: No Excoriation: No Excoriation: No Induration: No Induration: No Induration: No Callus: No Callus: No Callus: No Crepitus: No Crepitus: No Crepitus: No Rash: No Rash: No Rash: No Scarring: No Scarring: No Scarring: No Periwound Skin Moisture: Maceration: No Maceration: No Maceration: No Dry/Scaly: No Dry/Scaly: No Dry/Scaly: No Periwound Skin Color: Atrophie Blanche: No Atrophie Blanche: No Atrophie  Blanche: No Cyanosis: No Cyanosis: No Cyanosis: No Ecchymosis: No Ecchymosis: No Ecchymosis: No Erythema: No Erythema: No Erythema: No Hemosiderin Staining: No Hemosiderin Staining: No Hemosiderin Staining: No Mottled: No Mottled: No Mottled: No Pallor: No Pallor: No Pallor: No Rubor: No Rubor: No Rubor: No Temperature: No Abnormality No Abnormality No Abnormality Tenderness on Palpation: Yes Yes Yes Wound Preparation: Ulcer Cleansing: Ulcer Cleansing: Wound Ulcer Cleansing: Wound Rinsed/Irrigated with Saline Cleanser Cleanser Topical Anesthetic Applied: Topical Anesthetic Applied: Topical Anesthetic Applied: Other: lidocaine 4% Other: lidocaine 4% Other: lidocaine 4% Treatment Notes Electronic Signature(s) Signed: 03/30/2018 5:23:21 PM By: Gretta Cool, BSN, RN, CWS, Kim RN, BSN Entered By: Gretta Cool, BSN, RN, CWS, Kim on 03/30/2018 10:36:50 Heather [name], Heather [name] ([id number]) -------------------------------------------------------------------------------- Multi-Disciplinary Care Plan Details Patient Name: Heather [name] Date of Service: 03/30/2018 10:00 AM Medical Record Number: [id number] Patient Account Number: [id number] Date of Birth/Sex: 02/27/1928 (85 y.o. F) Treating RN: Cornell Barman Primary Care Jasenia Weilbacher: Benita Stabile Other Clinician: Referring Reginia Battie: Benita Stabile Treating Antinette Keough/Extender: Tito Dine in Treatment: 2 Active Inactive Abuse / Safety / Falls / Self Care Management Nursing Diagnoses: History of Falls Potential for falls Goals: Patient will not experience any injury related to falls Date Initiated: 03/16/2018 Target Resolution Date: 03/24/2018 Goal Status: Active Interventions: Assess fall risk on admission and as needed Notes: Nutrition Nursing Diagnoses: Potential for alteratiion in Nutrition/Potential for imbalanced nutrition Goals: Patient/caregiver agrees to and verbalizes understanding of need to obtain nutritional consultation Date  Initiated: 03/16/2018 Target Resolution Date: 03/25/2018 Goal Status: Active Interventions: Provide education on nutrition Notes: Orientation to the Wound Care Program Nursing Diagnoses: Knowledge deficit related to the wound healing center program Goals: Patient/caregiver will verbalize understanding of the [facility] Date Initiated: 03/16/2018 Target Resolution Date: 03/25/2018 Goal Status: Active Interventions: Heather [name], Heather [name] ([id number]) Provide education on orientation to the wound center Notes: Pain, Acute or Chronic Nursing Diagnoses: Pain, acute or chronic: actual or potential Potential alteration in comfort, pain Goals: Patient/caregiver will verbalize adequate pain control between visits Date Initiated: 03/16/2018 Target Resolution Date: 03/25/2018 Goal Status: Active Interventions: Assess comfort goal upon admission Treatment Activities: Administer pain control measures as ordered : 03/16/2018 Notes: Wound/Skin Impairment Nursing Diagnoses: Impaired tissue integrity Goals: Patient/caregiver will verbalize understanding of skin care regimen Date Initiated: 03/16/2018 Target Resolution Date: 03/25/2018 Goal Status: Active Interventions: Assess ulceration(s) every visit Treatment Activities: Topical wound management initiated : 03/16/2018  Notes: Engineer, maintenance) Signed: 03/30/2018 5:23:21 PM By: Gretta Cool, BSN, RN, CWS, Kim RN, BSN Entered By: Gretta Cool, BSN, RN, CWS, Kim on 03/30/2018 10:36:41 Heather [name], Heather [name] ([id number]) -------------------------------------------------------------------------------- Pain Assessment Details Patient Name: Heather [name] Date of Service: 03/30/2018 10:00 AM Medical Record Number: [id number] Patient Account Number: [id number] Date of Birth/Sex: June 25, 1928 (85 y.o. F) Treating RN: Montey Hora Primary Care Latoya Diskin: Benita Stabile Other Clinician: Referring Jan Olano: Benita Stabile Treating Kashana Breach/Extender: Tito Dine in Treatment: 2 Active Problems Location of Pain Severity and Description of Pain Patient Has Paino Yes Site Locations Pain Location: Pain in Ulcers With Dressing Change: Yes Duration of the Pain. Constant / Intermittento Intermittent Pain Management and Medication Current Pain Management: Notes hurts sometimes when sitting Electronic Signature(s) Signed: 03/30/2018 4:23:17 PM By: Montey Hora Entered By: Montey Hora on 03/30/2018 10:15:18 Heather [name], Heather [name] ([id number]) -------------------------------------------------------------------------------- Patient/Caregiver Education Details Patient Name: Heather [name] Date of Service: 03/30/2018 10:00 AM Medical Record Number: [id number] Patient Account Number: [id number] Date of Birth/Gender: [birth year] (85 y.o. F) Treating RN: Cornell Barman Primary Care Physician: Benita Stabile Other Clinician: Referring Physician: Benita Stabile Treating Physician/Extender: Tito Dine in Treatment: 2 Education Assessment Education Provided To: Patient and Caregiver Education Topics Provided Wound/Skin Impairment: Handouts: Caring for Your Ulcer, Other: continue daily wound care as prescribed Methods: Demonstration, Explain/Verbal Responses: State content correctly Electronic Signature(s) Signed: 03/30/2018 5:23:21 PM By: Gretta Cool, BSN, RN, CWS, Kim RN, BSN Entered By: Gretta Cool, BSN, RN, CWS, Kim on 03/30/2018 10:40:03 Heather [name], Heather [name] ([id number]) -------------------------------------------------------------------------------- Wound Assessment Details Patient Name: Heather [name] Date of Service: 03/30/2018 10:00 AM Medical Record Number: [id number] Patient Account Number: [id number] Date of Birth/Sex: [birth year] (85 y.o. F) Treating RN: Montey Hora Primary Care Malva Diesing: Benita Stabile Other Clinician: Referring Deondra Wigger: Benita Stabile Treating Latalia Etzler/Extender: Ricard Dillon Weeks in Treatment: 2 Wound Status Wound Number: 1  Primary Atypical Etiology: Wound Location: Left Peri-anal - Proximal Wound Status: Open Wounding Event: Gradually Appeared Comorbid Cataracts, Anemia, Hypertension, Received Date Acquired: 12/19/2017 History: Chemotherapy Weeks Of Treatment: 2 Clustered Wound: No Photos Photo Uploaded By: Montey Hora on 03/30/2018 10:32:38 Wound Measurements Length: (cm) 1 Width: (cm) 0.9 Depth: (cm) 0.1 Area: (cm) 0.707 Volume: (cm) 0.071 % Reduction in Area: -125.2% % Reduction in Volume: -129% Epithelialization: Medium (34-66%) Tunneling: No Undermining: No Wound Description Full Thickness Without Exposed Support Classification: Structures Wound Margin: Flat and Intact Exudate Medium Amount: Exudate Type: Serous Exudate Color: amber Foul Odor After Cleansing: No Slough/Fibrino Yes Wound Bed Granulation Amount: Large (67-100%) Exposed Structure Granulation Quality: Pink Fascia Exposed: No Necrotic Amount: Small (1-33%) Fat Layer (Subcutaneous Tissue) Exposed: Yes Necrotic Quality: Adherent Slough Tendon Exposed: No Muscle Exposed: No Joint Exposed: No Bone Exposed: No Colla, Jamee D. ([id number]) Periwound Skin Texture Texture Color No Abnormalities Noted: No No Abnormalities Noted: No Callus: No Atrophie Blanche: No Crepitus: No Cyanosis: No Excoriation: No Ecchymosis: No Induration: No Erythema: No Rash: No Hemosiderin Staining: No Scarring: No Mottled: No Pallor: No Moisture Rubor: No No Abnormalities Noted: No Dry / Scaly: No Temperature / Pain Maceration: No Temperature: No Abnormality Tenderness on Palpation: Yes Wound Preparation Ulcer Cleansing: Rinsed/Irrigated with Saline Topical Anesthetic Applied: Other: lidocaine 4%, Treatment Notes Wound #1 (Left, Proximal Peri-anal) Notes AandD ointment Electronic Signature(s) Signed: 03/30/2018 4:23:17 PM By: Montey Hora Entered By: Montey Hora on 03/30/2018 10:27:31 Singley, Heather [name]  ([id number]) -------------------------------------------------------------------------------- Wound Assessment Details Patient Name: Heather [name] Date of Service: 03/30/2018 10:00 AM Medical Record Number: [id number]  Patient Account Number: [id number] Date of Birth/Sex: 01-Aug-1928 (85 y.o. F) Treating RN: Montey Hora Primary Care Reisa Coppola: Benita Stabile Other Clinician: Referring Coben Godshall: Benita Stabile Treating Rosamond Andress/Extender: Ricard Dillon Weeks in Treatment: 2 Wound Status Wound Number: 2 Primary Atypical Etiology: Wound Location: Left Peri-anal - Distal Wound Status: Open Wounding Event: Gradually Appeared Comorbid Cataracts, Anemia, Hypertension, Received Date Acquired: 12/19/2017 History: Chemotherapy Weeks Of Treatment: 2 Clustered Wound: Yes Photos Photo Uploaded By: Montey Hora on 03/30/2018 10:32:39 Wound Measurements Length: (cm) 1.8 Width: (cm) 1.2 Depth: (cm) 0.1 Clustered Quantity: 1 Area: (cm) 1.696 Volume: (cm) 0.17 % Reduction in Area: 92.8% % Reduction in Volume: 92.8% Epithelialization: Small (1-33%) Tunneling: No Undermining: No Wound Description Full Thickness Without Exposed Support Classification: Structures Wound Margin: Flat and Intact Exudate Medium Amount: Exudate Type: Serous Exudate Color: amber Foul Odor After Cleansing: No Slough/Fibrino Yes Wound Bed Granulation Amount: Large (67-100%) Exposed Structure Granulation Quality: Red Fascia Exposed: No Necrotic Amount: Small (1-33%) Fat Layer (Subcutaneous Tissue) Exposed: Yes Necrotic Quality: Adherent Slough Tendon Exposed: No Muscle Exposed: No Joint Exposed: No Balch, Kaisha D. ([id number]) Bone Exposed: No Periwound Skin Texture Texture Color No Abnormalities Noted: No No Abnormalities Noted: No Callus: No Atrophie Blanche: No Crepitus: No Cyanosis: No Excoriation: No Ecchymosis: No Induration: No Erythema: No Rash: No Hemosiderin Staining: No Scarring:  No Mottled: No Pallor: No Moisture Rubor: No No Abnormalities Noted: No Dry / Scaly: No Temperature / Pain Maceration: No Temperature: No Abnormality Tenderness on Palpation: Yes Wound Preparation Ulcer Cleansing: Wound Cleanser Topical Anesthetic Applied: Other: lidocaine 4%, Treatment Notes Wound #2 (Left, Distal Peri-anal) Notes AandD ointment Electronic Signature(s) Signed: 03/30/2018 4:23:17 PM By: Montey Hora Entered By: Montey Hora on 03/30/2018 10:28:09 Heather [name], Heather [name] ([id number]) -------------------------------------------------------------------------------- Wound Assessment Details Patient Name: Heather [name] Date of Service: 03/30/2018 10:00 AM Medical Record Number: [id number] Patient Account Number: [id number] Date of Birth/Sex: [birth year] (85 y.o. F) Treating RN: Montey Hora Primary Care Morrell Fluke: Benita Stabile Other Clinician: Referring Maycel Riffe: Benita Stabile Treating Suliman Termini/Extender: Ricard Dillon Weeks in Treatment: 2 Wound Status Wound Number: 3 Primary Atypical Etiology: Wound Location: Right Peri-anal Wound Status: Open Wounding Event: Gradually Appeared Comorbid Cataracts, Anemia, Hypertension, Received Date Acquired: 12/19/2017 History: Chemotherapy Weeks Of Treatment: 2 Clustered Wound: No Photos Photo Uploaded By: Montey Hora on 03/30/2018 10:32:50 Wound Measurements Length: (cm) 4 Width: (cm) 4.5 Depth: (cm) 0.2 Area: (cm) 14.137 Volume: (cm) 2.827 % Reduction in Area: 20% % Reduction in Volume: -60% Epithelialization: Small (1-33%) Tunneling: No Undermining: No Wound Description Full Thickness Without Exposed Support Classification: Structures Wound Margin: Flat and Intact Exudate Medium Amount: Exudate Type: Serous Exudate Color: amber Foul Odor After Cleansing: No Slough/Fibrino Yes Wound Bed Granulation Amount: Medium (34-66%) Exposed Structure Granulation Quality: Pink Fascia Exposed: No Necrotic  Amount: Medium (34-66%) Fat Layer (Subcutaneous Tissue) Exposed: Yes Necrotic Quality: Adherent Slough Tendon Exposed: No Muscle Exposed: No Joint Exposed: No Bone Exposed: No Lobdell, Yasmen D. ([id number]) Periwound Skin Texture Texture Color No Abnormalities Noted: No No Abnormalities Noted: No Callus: No Atrophie Blanche: No Crepitus: No Cyanosis: No Excoriation: No Ecchymosis: No Induration: No Erythema: No Rash: No Hemosiderin Staining: No Scarring: No Mottled: No Pallor: No Moisture Rubor: No No Abnormalities Noted: No Dry / Scaly: No Temperature / Pain Maceration: No Temperature: No Abnormality Tenderness on Palpation: Yes Wound Preparation Ulcer Cleansing: Wound Cleanser Topical Anesthetic Applied: Other: lidocaine 4%, Treatment Notes Wound #3 (Right Peri-anal) Notes AandD ointment Electronic Signature(s) Signed: 03/30/2018 4:23:17 PM By:  Dorthy, Di Kindle Entered By: Montey Hora on 03/30/2018 10:28:59 Heather [name] ([id number]) -------------------------------------------------------------------------------- Vitals Details Patient Name: Heather [name] Date of Service: 03/30/2018 10:00 AM Medical Record Number: [id number] Patient Account Number: [id number] Date of Birth/Sex: 1928/08/18 (85 y.o. F) Treating RN: Montey Hora Primary Care Donie Moulton: Benita Stabile Other Clinician: Referring Franshesca Chipman: TATE, Sharlet Salina Treating Magally Vahle/Extender: Tito Dine in Treatment: 2 Vital Signs Time Taken: 10:16 Temperature (F): 98.4 Height (in): 62 Pulse (bpm): 73 Weight (lbs): 135 Respiratory Rate (breaths/min): 14 Body Mass Index (BMI): 24.7 Blood Pressure (mmHg): 130/40 Reference Range: 80 - 120 mg / dl Electronic Signature(s) Signed: 03/30/2018 4:23:17 PM By: Montey Hora Entered By: Montey Hora on 03/30/2018 10:17:24

## 2018-03-31 NOTE — Progress Notes (Signed)
Received call from Dr. Dellia Nims, wound care who states he had also performed biopsy and has pathology report which he faxed for review.   Pathology:  Diagnosis: PERIANAL AREA Atypical lymphoid infiltrate with overlying ulceration.  The specimen shows ulceration and overlying a moderately intense nodular infiltration of lymphocytes demonstrating mixed positivity with CD3 and CD20 immunostains and scattered positivity was CD30 immunostain.  CD20 positive cells show reactivity with CD5, and BCL-2 immunostains, and lack of reactivity with CD 23, and cyclin D1 immunostains, kappa and lambda in situ hybridization shows that approximately 90% of the plasmacytoid cells show reactivity with kappa immunostain and rest showing positivity with lambda probe.  These findings are diagnostic of atypical lymphoid infiltrate.  There are features in this biopsy which raise the possibility of a monoclonal B-cell population ( [facility] pATHOL lAB mED [phone]). A fresh biopsy sent immediately in a special medium to a hematopathology laboratory for flow cytometry may help in coming to a more conclusive diagnosis, if clinically indicated (Am J Dermatopathol.2014. Oct;36(10); 781-9.  Ancillary studies such as FISH and molecular studies may be helpful in this case, if clinically indicated.  This case has been reviewed in intradepartmental hematopathology consultation and the diagnosis reflects our consensus opinion.  Specimen ID: [id number] Control ID: [id number] Account number: [id number] Date collected: 03/16/2018 Date reported: 03/28/2018  Comments: PID: [id number] Electronically signed by: Lizabeth Leyden, MD, PhD, Dermatopathologist

## 2018-03-31 NOTE — Progress Notes (Signed)
TINE, MABEE ([id number]) Visit Report for 03/30/2018 HPI Details Patient Name: Heather [name], Heather [name] Date of Service: 03/30/2018 10:00 AM Medical Record Number: [id number] Patient Account Number: [id number] Date of Birth/Sex: Jan 27, 1928 (85 y.o. F) Treating RN: Primary Care Provider: Benita Stabile Other Clinician: Referring Provider: Benita Stabile Treating Provider/Extender: Tito Dine in Treatment: 2 History of Present Illness HPI Description: Admission 03/16/2018 This is a frail 85 year old woman who was sent here from her oncologist when it was discovered that she had large perianal ulcerations. An appointment is also been made with general surgery. The clinic appointment with oncology was just yesterday. The patient is followed for chronic lymphocytic leukemia. She is on Venclexta however I believe that was put on hold yesterday when the ulcerations were discovered. There is very little available history here. Her sister who accompanies her said that she is aware for about the last month that the patient was complaining of pain in the perirectal area but she did not actually see anything and the patient has been taking care of things herself. The patient seems unaware of how long this is actually been present but she seems to be suggesting that it is longer than a month. In any case there is no other information. I do not believe she is ever had a colonoscopy or at least they did not tell me about this. She is in a lot of pain but states her bowel movements are formed about every second day no rectal bleeding. In general she is not doing particularly well she does not eat well she is lost weight. She has become more dependent in the last month living in a seniors residence I believe called [facility]. Past medical history includes chronic lymphocytic leukemia, tumor lysis syndrome, hearing loss, hypertension, thrombocytopenia and hypothyroidism 3/11; the biopsy I did last  week showed an atypical lymphoid infiltrate. There were features in the biopsy which raise the possibility of a monoclonal B-cell population. Suggestion was made for a repeat specimen for flow cytometry. Paradoxically she was seen at the oncology office yesterday and had another biopsy done. General surgery saw the patient on 2/27. Offered to do an internal exam. Recommended screening for HSV among other viral etiologies. Electronic Signature(s) Signed: 03/30/2018 5:42:07 PM By: Linton Ham MD Entered By: Linton Ham on 03/30/2018 11:05:45 [place], Heather [name] ([id number]) -------------------------------------------------------------------------------- Physical Exam Details Patient Name: Heather [name] Date of Service: 03/30/2018 10:00 AM Medical Record Number: [id number] Patient Account Number: [id number] Date of Birth/Sex: 11/25/1928 (85 y.o. F) Treating RN: Primary Care Provider: Benita Stabile Other Clinician: Referring Provider: TATE, Sharlet [place] Treating Provider/Extender: Ricard Dillon Weeks in Treatment: 2 Constitutional Sitting or standing Blood Pressure is within target range for patient.. Pulse regular and within target range for patient.Marland Kitchen Respirations regular, non-labored and within target range.Marland Kitchen appears in no distress. Eyes Conjunctivae clear. No discharge. Respiratory Respiratory effort is easy and symmetric bilaterally. Rate is normal at rest and on room air.. Gastrointestinal (GI) Abdomen is soft and non-distended without masses or tenderness. Bowel sounds active in all quadrants.. No liver or spleen enlargement or tenderness.. Integumentary (Hair, Skin) No other cutaneous issues are seen. Psychiatric No evidence of depression, anxiety, or agitation. Calm, cooperative, and communicative. Appropriate interactions and affect.. Notes Wound exam; on the left she has a large but superficial ulceration over a large area of the perianal. Encompasses her anal opening. The area is  tender but nothing pathognomonic about this. oOn the right she has 2 separate open areas  these actually looks somewhat better this week. Electronic Signature(s) Signed: 03/30/2018 5:42:07 PM By: Linton Ham MD Entered By: Linton Ham on 03/30/2018 11:08:43 Heather [name], Heather [name] ([id number]) -------------------------------------------------------------------------------- Physician Orders Details Patient Name: Heather [name] Date of Service: 03/30/2018 10:00 AM Medical Record Number: [id number] Patient Account Number: [id number] Date of Birth/Sex: 07/09/1928 (85 y.o. F) Treating RN: Cornell Barman Primary Care Provider: Benita Stabile Other Clinician: Referring Provider: Benita Stabile Treating Provider/Extender: Tito Dine in Treatment: 2 Verbal / Phone Orders: No Diagnosis Coding Wound Cleansing Wound #1 Left,Proximal Peri-anal o Cleanse wound with mild soap and water Wound #2 Left,Distal Peri-anal o Cleanse wound with mild soap and water Wound #3 Right Peri-anal o Cleanse wound with mild soap and water Anesthetic (add to Medication List) Wound #1 Left,Proximal Peri-anal o Topical Lidocaine 4% cream applied to wound bed prior to debridement (In Clinic Only). Wound #2 Left,Distal Peri-anal o Topical Lidocaine 4% cream applied to wound bed prior to debridement (In Clinic Only). Wound #3 Right Peri-anal o Topical Lidocaine 4% cream applied to wound bed prior to debridement (In Clinic Only). Skin Barriers/Peri-Wound Care Wound #1 Left,Proximal Peri-anal o Other: - AandD ointment Wound #2 Left,Distal Peri-anal o Other: - AandD ointment Wound #3 Right Peri-anal o Other: - AandD ointment Follow-up Appointments Wound #1 Left,Proximal Peri-anal o Return Appointment in 2 weeks. Wound #2 Left,Distal Peri-anal o Return Appointment in 2 weeks. Wound #3 Right Peri-anal o Return Appointment in 2 weeks. Home Health Wound #1 Left,Proximal [place] Visits - [place] Nurse may visit PRN to address patientos wound care needs. Heather [name], Heather [name] ([id number]) o FACE TO FACE ENCOUNTER: MEDICARE and MEDICAID PATIENTS: I certify that this patient is under my care and that I had a face-to-face encounter that meets the physician face-to-face encounter requirements with this patient on this date. The encounter with the patient was in whole or in part for the following MEDICAL CONDITION: (primary reason for [place]) MEDICAL NECESSITY: I certify, that based on my findings, NURSING services are a medically necessary home health service. HOME BOUND STATUS: I certify that my clinical findings support that this patient is homebound (i.e., Due to illness or injury, pt requires aid of supportive devices such as crutches, cane, wheelchairs, walkers, the use of special transportation or the assistance of another person to leave their place of residence. There is a normal inability to leave the home and doing so requires considerable and taxing effort. Other absences are for medical reasons / religious services and are infrequent or of short duration when for other reasons). o If current dressing causes regression in wound condition, may D/C ordered dressing product/s and apply Normal Saline Moist Dressing daily until next [place] / Other MD appointment. [place] of regression in wound condition at [phone]. o Please direct any NON-WOUND related issues/requests for orders to patient's Primary Care Physician Wound #2 [place] Visits - [place] Nurse may visit PRN to address patientos wound care needs. o FACE TO FACE ENCOUNTER: MEDICARE and MEDICAID PATIENTS: I certify that this patient is under my care and that I had a face-to-face encounter that meets the physician face-to-face encounter requirements with this patient on this date.  The encounter with the patient was in whole or in part for the following MEDICAL CONDITION: (primary reason for [place]) MEDICAL NECESSITY: I certify, that based on my findings, NURSING services are a medically necessary  home health service. HOME BOUND STATUS: I certify that my clinical findings support that this patient is homebound (i.e., Due to illness or injury, pt requires aid of supportive devices such as crutches, cane, wheelchairs, walkers, the use of special transportation or the assistance of another person to leave their place of residence. There is a normal inability to leave the home and doing so requires considerable and taxing effort. Other absences are for medical reasons / religious services and are infrequent or of short duration when for other reasons). o If current dressing causes regression in wound condition, may D/C ordered dressing product/s and apply Normal Saline Moist Dressing daily until next [place] / Other MD appointment. [place] of regression in wound condition at [phone]. o Please direct any NON-WOUND related issues/requests for orders to patient's Primary Care Physician Wound #3 Right [place] Visits - [place] Nurse may visit PRN to address patientos wound care needs. o FACE TO FACE ENCOUNTER: MEDICARE and MEDICAID PATIENTS: I certify that this patient is under my care and that I had a face-to-face encounter that meets the physician face-to-face encounter requirements with this patient on this date. The encounter with the patient was in whole or in part for the following MEDICAL CONDITION: (primary reason for [place]) MEDICAL NECESSITY: I certify, that based on my findings, NURSING services are a medically necessary home health service. HOME BOUND STATUS: I certify that my clinical findings support that this patient is homebound (i.e., Due to illness or injury,  pt requires aid of supportive devices such as crutches, cane, wheelchairs, walkers, the use of special transportation or the assistance of another person to leave their place of residence. There is a normal inability to leave the home and doing so requires considerable and taxing effort. Other absences are for medical reasons / religious services and are infrequent or of short duration when for other reasons). o If current dressing causes regression in wound condition, may D/C ordered dressing product/s and apply Normal Saline Moist Dressing daily until next [place] / Other MD appointment. [place] of regression in wound condition at [phone]. o Please direct any NON-WOUND related issues/requests for orders to patient's Primary Care Physician Electronic Signature(s) Signed: 03/30/2018 5:23:21 PM By: Gretta Cool, BSN, RN, CWS, Kim RN, BSN Signed: 03/30/2018 5:42:07 PM By: Linton Ham MD Entered By: Gretta Cool, BSN, RN, CWS, Kim on 03/30/2018 10:38:33 Heather [name], Heather [name] ([id number]) Heather [name], Heather [name] ([id number]) -------------------------------------------------------------------------------- Problem List Details Patient Name: Heather [name] Date of Service: 03/30/2018 10:00 AM Medical Record Number: [id number] Patient Account Number: [id number] Date of Birth/Sex: 03-20-1928 (85 y.o. 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 03/16/2018 No Yes unspecified severity C91.10 Chronic lymphocytic leukemia of B-cell type not having 03/16/2018 No Yes achieved remission Inactive Problems Resolved Problems Electronic Signature(s) Signed: 03/30/2018 5:42:07  PM By: Linton Ham MD Entered By: Linton Ham on 03/30/2018 10:46:39 Heather [name], Heather [name] ([id number]) -------------------------------------------------------------------------------- Progress Note Details Patient Name: Heather [name] Date of Service: 03/30/2018 10:00 AM Medical Record Number: [id number] Patient Account Number: [id number] Date of Birth/Sex: 10-07-1928 (85 y.o. F) Treating RN: Primary Care Provider: Benita Stabile Other Clinician: Referring Provider: Benita Stabile Treating  Provider/Extender: Ricard Dillon Weeks in Treatment: 2 Subjective History of Present Illness (HPI) Admission 03/16/2018 This is a frail 85 year old woman who was sent here from her oncologist when it was discovered that she had large perianal ulcerations. An appointment is also been made with general surgery. The clinic appointment with oncology was just yesterday. The patient is followed for chronic lymphocytic leukemia. She is on Venclexta however I believe that was put on hold yesterday when the ulcerations were discovered. There is very little available history here. Her sister who accompanies her said that she is aware for about the last month that the patient was complaining of pain in the perirectal area but she did not actually see anything and the patient has been taking care of things herself. The patient seems unaware of how long this is actually been present but she seems to be suggesting that it is longer than a month. In any case there is no other information. I do not believe she is ever had a colonoscopy or at least they did not tell me about this. She is in a lot of pain but states her bowel movements are formed about every second day no rectal bleeding. In general she is not doing particularly well she does not eat well she is lost weight. She has become more dependent in the last month living in a seniors residence I believe called [facility]. Past medical history includes  chronic lymphocytic leukemia, tumor lysis syndrome, hearing loss, hypertension, thrombocytopenia and hypothyroidism 3/11; the biopsy I did last week showed an atypical lymphoid infiltrate. There were features in the biopsy which raise the possibility of a monoclonal B-cell population. Suggestion was made for a repeat specimen for flow cytometry. Paradoxically she was seen at the oncology office yesterday and had another biopsy done. General surgery saw the patient on 2/27. Offered to do an internal exam. Recommended screening for HSV among other viral etiologies. Objective Constitutional Sitting or standing Blood Pressure is within target range for patient.. Pulse regular and within target range for patient.Marland Kitchen Respirations regular, non-labored and within target range.Marland Kitchen appears in no distress. Vitals Time Taken: 10:16 AM, Height: 62 in, Weight: 135 lbs, BMI: 24.7, Temperature: 98.4 F, Pulse: 73 bpm, Respiratory Rate: 14 breaths/min, Blood Pressure: 130/40 mmHg. Eyes Conjunctivae clear. No discharge. Respiratory Respiratory effort is easy and symmetric bilaterally. Rate is normal at rest and on room air.Marland Kitchen Heather [name], Heather D. ([id number]) Gastrointestinal (GI) Abdomen is soft and non-distended without masses or tenderness. Bowel sounds active in all quadrants.. No liver or spleen enlargement or tenderness.Marland Kitchen Psychiatric No evidence of depression, anxiety, or agitation. Calm, cooperative, and communicative. Appropriate interactions and affect.. General Notes: Wound exam; on the left she has a large but superficial ulceration over a large area of the perianal. Encompasses her anal opening. The area is tender but nothing pathognomonic about this. On the right she has 2 separate open areas these actually looks somewhat better this week. Integumentary (Hair, Skin) No other cutaneous issues are seen. Wound #1 status is Open. Original cause of wound was Gradually Appeared. The wound is located on the  Left,Proximal Peri- anal. The wound measures 1cm length x 0.9cm width x 0.1cm depth; 0.707cm^2 area and 0.071cm^3 volume. There is Fat Layer (Subcutaneous Tissue) Exposed exposed. There is no tunneling or undermining noted. There is a medium amount of serous drainage noted. The wound margin is flat and intact. There is large (67-100%) pink granulation within the wound bed. There is a small (1-33%)  amount of necrotic tissue within the wound bed including Adherent Slough. The periwound skin appearance did not exhibit: Callus, Crepitus, Excoriation, Induration, Rash, Scarring, Dry/Scaly, Maceration, Atrophie Blanche, Cyanosis, Ecchymosis, Hemosiderin Staining, Mottled, Pallor, Rubor, Erythema. Periwound temperature was noted as No Abnormality. The periwound has tenderness on palpation. Wound #2 status is Open. Original cause of wound was Gradually Appeared. The wound is located on the Left,Distal Peri- anal. The wound measures 1.8cm length x 1.2cm width x 0.1cm depth; 1.696cm^2 area and 0.17cm^3 volume. There is Fat Layer (Subcutaneous Tissue) Exposed exposed. There is no tunneling or undermining noted. There is a medium amount of serous drainage noted. The wound margin is flat and intact. There is large (67-100%) red granulation within the wound bed. There is a small (1-33%) amount of necrotic tissue within the wound bed including Adherent Slough. The periwound skin appearance did not exhibit: Callus, Crepitus, Excoriation, Induration, Rash, Scarring, Dry/Scaly, Maceration, Atrophie Blanche, Cyanosis, Ecchymosis, Hemosiderin Staining, Mottled, Pallor, Rubor, Erythema. Periwound temperature was noted as No Abnormality. The periwound has tenderness on palpation. Wound #3 status is Open. Original cause of wound was Gradually Appeared. The wound is located on the Right Peri-anal. The wound measures 4cm length x 4.5cm width x 0.2cm depth; 14.137cm^2 area and 2.827cm^3 volume. There is Fat  Layer (Subcutaneous Tissue) Exposed exposed. There is no tunneling or undermining noted. There is a medium amount of serous drainage noted. The wound margin is flat and intact. There is medium (34-66%) pink granulation within the wound bed. There is a medium (34-66%) amount of necrotic tissue within the wound bed including Adherent Slough. The periwound skin appearance did not exhibit: Callus, Crepitus, Excoriation, Induration, Rash, Scarring, Dry/Scaly, Maceration, Atrophie Blanche, Cyanosis, Ecchymosis, Hemosiderin Staining, Mottled, Pallor, Rubor, Erythema. Periwound temperature was noted as No Abnormality. The periwound has tenderness on palpation. Assessment Active Problems ICD-10 Ulcer of anus and rectum Non-pressure chronic ulcer of skin of other sites with unspecified severity Chronic lymphocytic leukemia of B-cell type not having achieved remission Heather [name], Heather [name]. ([id number]) Plan Wound Cleansing: Wound #1 Left,Proximal Peri-anal: Cleanse wound with mild soap and water Wound #2 Left,Distal Peri-anal: Cleanse wound with mild soap and water Wound #3 Right Peri-anal: Cleanse wound with mild soap and water Anesthetic (add to Medication List): Wound #1 Left,Proximal Peri-anal: Topical Lidocaine 4% cream applied to wound bed prior to debridement (In Clinic Only). Wound #2 Left,Distal Peri-anal: Topical Lidocaine 4% cream applied to wound bed prior to debridement (In Clinic Only). Wound #3 Right Peri-anal: Topical Lidocaine 4% cream applied to wound bed prior to debridement (In Clinic Only). Skin Barriers/Peri-Wound Care: Wound #1 Left,Proximal Peri-anal: Other: - AandD ointment Wound #2 Left,Distal Peri-anal: Other: - AandD ointment Wound #3 Right Peri-anal: Other: - AandD ointment Follow-up Appointments: Wound #1 Left,Proximal Peri-anal: Return Appointment in 2 weeks. Wound #2 Left,Distal Peri-anal: Return Appointment in 2 weeks. Wound #3 Right Peri-anal: Return  Appointment in 2 weeks. Home Health: Wound #1 Left,Proximal Peri-anal: Continue Home Health Visits - [place] Nurse may visit PRN to address patient s wound care needs. FACE TO FACE ENCOUNTER: MEDICARE and MEDICAID PATIENTS: I certify that this patient is under my care and that I had a face-to-face encounter that meets the physician face-to-face encounter requirements with this patient on this date. The encounter with the patient was in whole or in part for the following MEDICAL CONDITION: (primary reason for [place]) MEDICAL NECESSITY: I certify, that based on my findings, NURSING services are a medically necessary home health service.  HOME BOUND STATUS: I certify that my clinical findings support that this patient is homebound (i.e., Due to illness or injury, pt requires aid of supportive devices such as crutches, cane, wheelchairs, walkers, the use of special transportation or the assistance of another person to leave their place of residence. There is a normal inability to leave the home and doing so requires considerable and taxing effort. Other absences are for medical reasons / religious services and are infrequent or of short duration when for other reasons). If current dressing causes regression in wound condition, may D/C ordered dressing product/s and apply Normal Saline Moist Dressing daily until next [place] / Other MD appointment. [place] of regression in wound condition at [phone]. Please direct any NON-WOUND related issues/requests for orders to patient's Primary Care Physician Wound #2 Left,Distal Peri-anal: [place] Nurse may visit PRN to address patient s wound care needs. FACE TO FACE ENCOUNTER: MEDICARE and MEDICAID PATIENTS: I certify that this patient is under my care and that I had a face-to-face encounter that meets the physician face-to-face encounter requirements with  this patient on this date. The encounter with the patient was in whole or in part for the following MEDICAL CONDITION: (primary reason for [place]) MEDICAL NECESSITY: I certify, that based on my findings, NURSING services are a medically necessary home health service. HOME BOUND STATUS: I certify that my clinical findings support that this patient is homebound (i.e., Due to illness or injury, pt requires aid of supportive devices such as crutches, cane, wheelchairs, walkers, the use of special transportation or the assistance of another person to leave their place of residence. There is a normal inability to leave the home and doing so requires considerable and taxing effort. Other absences are for medical reasons / religious services and Heather [name], Heather D. ([id number]) are infrequent or of short duration when for other reasons). If current dressing causes regression in wound condition, may D/C ordered dressing product/s and apply Normal Saline Moist Dressing daily until next [place] / Other MD appointment. [place] of regression in wound condition at [phone]. Please direct any NON-WOUND related issues/requests for orders to patient's Primary Care Physician Wound #3 Right Peri-anal: [place] Nurse may visit PRN to address patient s wound care needs. FACE TO FACE ENCOUNTER: MEDICARE and MEDICAID PATIENTS: I certify that this patient is under my care and that I had a face-to-face encounter that meets the physician face-to-face encounter requirements with this patient on this date. The encounter with the patient was in whole or in part for the following MEDICAL CONDITION: (primary reason for [place]) MEDICAL NECESSITY: I certify, that based on my findings, NURSING services are a medically necessary home health service. HOME BOUND STATUS: I certify that my clinical findings support that this patient is homebound  (i.e., Due to illness or injury, pt requires aid of supportive devices such as crutches, cane, wheelchairs, walkers, the use of special transportation or the assistance of another person to leave their place of residence. There is a normal inability to leave the home and doing so requires considerable and taxing effort. Other absences are for medical reasons / religious services and are infrequent or of short duration when for other reasons). If current dressing causes regression in wound condition, may D/C ordered dressing product/s and apply Normal Saline Moist Dressing daily until next [place] / Other MD appointment.  [place] of regression in wound condition at [phone]. Please direct any NON-WOUND related issues/requests for orders to patient's Primary Care Physician 1. Biopsy suggested the possibility of a monoclonal B-cell infiltrate. 2. Do not disagree with viral screening especially herpes simplex 3. Internal examination would seem reasonable 4. There is very little we can do with this area other than to keep this protected with zinc oxide and perhaps gauze to keep her folds away from each other. 5. I have reached out to oncology to send them my biopsy, have not heard back yet Electronic Signature(s) Signed: 03/30/2018 5:42:07 PM By: Linton Ham MD Entered By: Linton Ham on 03/30/2018 11:12:04 Heather [name], Heather [name] ([id number]) -------------------------------------------------------------------------------- SuperBill Details Patient Name: Heather [name] Date of Service: 03/30/2018 Medical Record Number: [id number] Patient Account Number: [id number] Date of Birth/Sex: [birth year] (85 y.o. F) Treating RN: Primary Care Provider: Benita Stabile Other Clinician: Referring Provider: TATE, Sharlet Salina Treating Provider/Extender: Tito Dine in Treatment: 2 Diagnosis Coding ICD-10 Codes Code Description K62.6 Ulcer of anus and rectum L98.499  Non-pressure chronic ulcer of skin of other sites with unspecified severity C91.10 Chronic lymphocytic leukemia of B-cell type not having achieved remission Facility Procedures CPT4 Code: [id number] Description: 99213 - WOUND CARE VISIT-LEV 3 EST PT Modifier: Quantity: 1 Physician Procedures CPT4 Code: 7867672 Description: 09470 - WC PHYS LEVEL 3 - EST PT ICD-10 Diagnosis Description K62.6 Ulcer of anus and rectum L98.499 Non-pressure chronic ulcer of skin of other sites with unspec C91.10 Chronic lymphocytic leukemia of B-cell type not having achiev Modifier: ified severity ed remission Quantity: 1 Electronic Signature(s) Signed: 03/30/2018 5:42:07 PM By: Linton Ham MD Entered By: Linton Ham on 03/30/2018 11:12:23

## 2018-04-01 ENCOUNTER — Other Ambulatory Visit: Payer: Self-pay | Admitting: Nurse Practitioner

## 2018-04-01 ENCOUNTER — Telehealth: Payer: Self-pay | Admitting: Internal Medicine

## 2018-04-01 LAB — SURGICAL PATHOLOGY

## 2018-04-01 MED ORDER — VALACYCLOVIR HCL 1 G PO TABS: 1000.0000 mg | ORAL_TABLET | Freq: Two times a day (BID) | ORAL | 0 refills | Status: AC

## 2018-04-01 NOTE — Telephone Encounter (Signed)
I contacted Nikki at [facility] pathology. She said to just fax an order for pathology slides with specimen ID to [id number].  Order has been faxed. Thanks!

## 2018-04-01 NOTE — Progress Notes (Signed)
Called patient to discuss results. She was not available. Patient's nephew answered and asked if I'd discuss with her sister, Izora Gala. Called Izora Gala who did not answer and no voicemail.  I have sent prescription to [facility] for Valtrex given HSV positivity of wound and based on recommendation of Dr. Rogue Bussing. Patient scheduled for re-eval next week.

## 2018-04-01 NOTE — Telephone Encounter (Signed)
Heather [name]- please check with [facility] pathology re: recent skin bx that she had; and we need to have the slides reviewed at our pathology dept.  Speak to Lauren if needed for help/directions with above.  # Follow up with me in 2 weeks/cbc-bmp-  # Make referral to radiation oncology -next week re: CLL/skin ulceration  Dr.B

## 2018-04-01 NOTE — Telephone Encounter (Signed)
Dr. Rogue Bussing- Given HSV positivity, do you want the patient started on anti-viral/valtrex? Also, would our lab already have the biopsy that I did? Do you want them both reviewed?   Brooke- I spoke to Dr. Baruch Gouty yesterday and he said to either send referral or just let Ana in radiation know and she will schedule patient.   Thanks! Ander Purpura

## 2018-04-04 ENCOUNTER — Telehealth: Payer: Self-pay | Admitting: Nurse Practitioner

## 2018-04-04 ENCOUNTER — Inpatient Hospital Stay: Payer: Medicare Other | Admitting: Nurse Practitioner

## 2018-04-04 NOTE — Telephone Encounter (Signed)
Called patient. Appointment for today for wound check rescheduled for 04/18/2018. She will continue vacyclovir for total of 14 days per Dr. Rogue Bussing. Per sister, [name], she has started the valtrex. I will touch base with rad-onc to discuss getting patient scheduled for eval.

## 2018-04-06 ENCOUNTER — Other Ambulatory Visit: Payer: Self-pay

## 2018-04-07 ENCOUNTER — Ambulatory Visit
Admission: RE | Admit: 2018-04-07 | Discharge: 2018-04-07 | Disposition: A | Discharge status: Home / Self Care | Payer: Medicare Other | Source: Ambulatory Visit | Attending: Radiation Oncology | Admitting: Radiation Oncology

## 2018-04-07 ENCOUNTER — Other Ambulatory Visit: Payer: Self-pay

## 2018-04-07 DIAGNOSIS — Z79899 Other long term (current) drug therapy: Secondary | ICD-10-CM | POA: Diagnosis not present

## 2018-04-07 DIAGNOSIS — E039 Hypothyroidism, unspecified: Secondary | ICD-10-CM | POA: Diagnosis not present

## 2018-04-07 DIAGNOSIS — I10 Essential (primary) hypertension: Secondary | ICD-10-CM | POA: Insufficient documentation

## 2018-04-07 DIAGNOSIS — Z800 Family history of malignant neoplasm of digestive organs: Secondary | ICD-10-CM | POA: Insufficient documentation

## 2018-04-07 DIAGNOSIS — C9110 Chronic lymphocytic leukemia of B-cell type not having achieved remission: Secondary | ICD-10-CM | POA: Diagnosis present

## 2018-04-07 DIAGNOSIS — C8300 Small cell B-cell lymphoma, unspecified site: Secondary | ICD-10-CM

## 2018-04-07 DIAGNOSIS — E785 Hyperlipidemia, unspecified: Secondary | ICD-10-CM | POA: Insufficient documentation

## 2018-04-07 DIAGNOSIS — Z9221 Personal history of antineoplastic chemotherapy: Secondary | ICD-10-CM | POA: Diagnosis not present

## 2018-04-07 DIAGNOSIS — D696 Thrombocytopenia, unspecified: Secondary | ICD-10-CM | POA: Diagnosis not present

## 2018-04-07 DIAGNOSIS — D649 Anemia, unspecified: Secondary | ICD-10-CM | POA: Diagnosis not present

## 2018-04-07 DIAGNOSIS — Z993 Dependence on wheelchair: Secondary | ICD-10-CM | POA: Insufficient documentation

## 2018-04-07 DIAGNOSIS — R54 Age-related physical debility: Secondary | ICD-10-CM | POA: Diagnosis not present

## 2018-04-07 NOTE — Consult Note (Signed)
NEW PATIENT EVALUATION  Name: Heather [name]  MRN: [id number]  Date:   04/07/2018     DOB: [birth year]   This 85 y.o. female patient presents to the clinic for initial evaluation of CLL involving perianal region.  REFERRING PHYSICIAN: Albina Billet, MD  CHIEF COMPLAINT:  Chief Complaint  Patient presents with  . Cancer    Initial consultation    DIAGNOSIS: The primary encounter diagnosis was Lymphocytic lymphoma ([place]). A diagnosis of Chronic lymphocytic leukemia ([place]) was also pertinent to this visit.   PREVIOUS INVESTIGATIONS:  Pathology reports reviewed Clinical notes reviewed CT scans of abdomen as well as prior PET/CT scan reviewed  HPI: patient is a 85 year old female originally diagnosed with abdominal lymphadenopathy back in March 2013. Peripheral blood biopsy was suggestive of CLL. She's finished multiple chemotherapy interventions including Rituxan and bendamustine.she was started from progression of disease onIBRUTANIB . In August 2016 recently switched to Venatoclax.she's recently presented with perianal ulceration and biopsy by Dr. Tollie Pizza showed atypical lymphoid infiltrate consistent with CLL. She is seen today for consideration of palliative treatment to that region. She states it is hurting is foul-smelling and occasionally bleeding. She is quite frail and elderly and wheelchair-bound.  PLANNED TREATMENT REGIMEN: palliative electron beam therapy  PAST MEDICAL HISTORY:  has a past medical history of Anemia, Cataracts, bilateral, Chronic lymphocytic leukemia ([place]) (06/06/2014), Hearing loss, History of chemotherapy, Hyperlipidemia, Hypertension, Hypothyroidism, Palpitations, Risk for falls, and Thrombocytopenia ([place]).    PAST SURGICAL HISTORY:  Past Surgical History:  Procedure Laterality Date  . CATARACT EXTRACTION Left 03/29/2012  . Excision left cervical node biopsy  05/04/2011    FAMILY HISTORY: family history includes Diabetes in her sister; Heart disease in her  mother; Stomach cancer in her father.  SOCIAL HISTORY:  reports that she has never smoked. She has never used smokeless tobacco. She reports that she does not drink alcohol or use drugs.  ALLERGIES: Losartan  MEDICATIONS:  Current Outpatient Medications  Medication Sig Dispense Refill  . allopurinol (ZYLOPRIM) 300 MG tablet Take 1 tablet (300 mg total) by mouth 2 (two) times daily. 120 tablet 0  . levothyroxine (SYNTHROID, LEVOTHROID) 75 MCG tablet Take 75 mcg by mouth daily before breakfast. Only takes Saturday- Thursday. Hold synthroid on Friday    . metoprolol tartrate (LOPRESSOR) 25 MG tablet Take 1 tablet (25 mg total) by mouth 2 (two) times daily. 60 tablet 0  . valACYclovir (VALTREX) 1000 MG tablet Take 1 tablet (1,000 mg total) by mouth 2 (two) times daily for 10 days. 20 tablet 0  . venetoclax (VENCLEXTA STARTING PACK) 10 & 50 & 100 MG TBPK Take 20mg  by mouth daily for week one, 50mg  daily for week 2, 100mg  daily for week 3, 200mg  daily for week 4. Take as directed. (Patient not taking: Reported on 03/29/2018) 42 each 0  . venetoclax (VENCLEXTA) 10 MG TABS Take 20 mg by mouth daily. Take with food and a full glass of water. (Patient not taking: Reported on 03/29/2018) 14 tablet 0   No current facility-administered medications for this encounter.     ECOG PERFORMANCE STATUS:  2 - Symptomatic, <50% confined to bed  REVIEW OF SYSTEMS:  Patient denies any weight loss, fatigue, weakness, fever, chills or night sweats. Patient denies any loss of vision, blurred vision. Patient denies any ringing  of the ears or hearing loss. No irregular heartbeat. Patient denies heart murmur or history of fainting. Patient denies any chest pain or pain radiating to her upper extremities.  Patient denies any shortness of breath, difficulty breathing at night, cough or hemoptysis. Patient denies any swelling in the lower legs. Patient denies any nausea vomiting, vomiting of blood, or coffee ground material in  the vomitus. Patient denies any stomach pain. Patient states has had normal bowel movements no significant constipation or diarrhea. Patient denies any dysuria, hematuria or significant nocturia. Patient denies any problems walking, swelling in the joints or loss of balance. Patient denies any skin changes, loss of hair or loss of weight. Patient denies any excessive worrying or anxiety or significant depression. Patient denies any problems with insomnia. Patient denies excessive thirst, polyuria, polydipsia. Patient denies any swollen glands, patient denies easy bruising or easy bleeding. Patient denies any recent infections, allergies or URI. Patient "s visual fields have not changed significantly in recent time.    PHYSICAL EXAM: There were no vitals taken for this visit. . The perianal region shows ulceration. No inguinal adenopathy is identified.Well-developed well-nourished patient in NAD. HEENT reveals PERLA, EOMI, discs not visualized.  Oral cavity is clear. No oral mucosal lesions are identified. Neck is clear without evidence of cervical or supraclavicular adenopathy. Lungs are clear to A&P. Cardiac examination is essentially unremarkable with regular rate and rhythm without murmur rub or thrill. Abdomen is benign with no organomegaly or masses noted. Motor sensory and DTR levels are equal and symmetric in the upper and lower extremities. Cranial nerves II through XII are grossly intact. Proprioception is intact. No peripheral adenopathy or edema is identified. No motor or sensory levels are noted. Crude visual fields are within normal range.  LABORATORY DATA: pathology reports reviewed    RADIOLOGY RESULTS:CT scans and PET/CT scan reviewed   IMPRESSION: CLL involvement of the perianal region causing ulceration of the skin in 85 year old female in poor general condition  PLAN: this time like to try a course of palliative radiation therapy. Datasets have shown 400 cGy in 2 fractions is  reliable to cause palliation of nodal metastasis and CLL and I think based on her overall general condition and difficulty treating we will try this regimen initially. I have described the risks and benefits of treatment including skin reaction fatigue with the patient and she seems to comprehend my treatment plan well. I have set her up for treatment planning next week.  I would like to take this opportunity to thank you for allowing me to participate in the care of your patient.Noreene Filbert, MD

## 2018-04-12 ENCOUNTER — Other Ambulatory Visit: Payer: Self-pay

## 2018-04-13 ENCOUNTER — Ambulatory Visit: Payer: Medicare Other | Admitting: Internal Medicine

## 2018-04-13 ENCOUNTER — Other Ambulatory Visit: Payer: Self-pay

## 2018-04-13 ENCOUNTER — Ambulatory Visit
Admission: RE | Admit: 2018-04-13 | Discharge: 2018-04-13 | Disposition: A | Discharge status: Home / Self Care | Payer: Medicare Other | Source: Ambulatory Visit | Attending: Radiation Oncology | Admitting: Radiation Oncology

## 2018-04-13 DIAGNOSIS — C9110 Chronic lymphocytic leukemia of B-cell type not having achieved remission: Secondary | ICD-10-CM | POA: Insufficient documentation

## 2018-04-13 DIAGNOSIS — I10 Essential (primary) hypertension: Secondary | ICD-10-CM | POA: Diagnosis not present

## 2018-04-13 DIAGNOSIS — Z9221 Personal history of antineoplastic chemotherapy: Secondary | ICD-10-CM | POA: Diagnosis not present

## 2018-04-13 DIAGNOSIS — Z79899 Other long term (current) drug therapy: Secondary | ICD-10-CM | POA: Diagnosis not present

## 2018-04-13 DIAGNOSIS — E039 Hypothyroidism, unspecified: Secondary | ICD-10-CM | POA: Insufficient documentation

## 2018-04-13 DIAGNOSIS — Z79890 Hormone replacement therapy (postmenopausal): Secondary | ICD-10-CM | POA: Diagnosis not present

## 2018-04-13 DIAGNOSIS — Z510 Encounter for antineoplastic radiation therapy: Secondary | ICD-10-CM | POA: Insufficient documentation

## 2018-04-14 DIAGNOSIS — Z510 Encounter for antineoplastic radiation therapy: Secondary | ICD-10-CM | POA: Diagnosis not present

## 2018-04-15 ENCOUNTER — Other Ambulatory Visit: Payer: Self-pay

## 2018-04-18 ENCOUNTER — Inpatient Hospital Stay (HOSPITAL_BASED_OUTPATIENT_CLINIC_OR_DEPARTMENT_OTHER): Payer: Medicare Other | Admitting: Nurse Practitioner

## 2018-04-18 ENCOUNTER — Encounter: Payer: Self-pay | Admitting: Nurse Practitioner

## 2018-04-18 ENCOUNTER — Other Ambulatory Visit: Payer: Self-pay

## 2018-04-18 VITALS — BP 123/67 | HR 93 | Temp 98.3°F | Resp 16

## 2018-04-18 DIAGNOSIS — C8589 Other specified types of non-Hodgkin lymphoma, extranodal and solid organ sites: Secondary | ICD-10-CM

## 2018-04-18 DIAGNOSIS — C9110 Chronic lymphocytic leukemia of B-cell type not having achieved remission: Secondary | ICD-10-CM | POA: Diagnosis not present

## 2018-04-18 DIAGNOSIS — C8300 Small cell B-cell lymphoma, unspecified site: Secondary | ICD-10-CM | POA: Diagnosis not present

## 2018-04-18 DIAGNOSIS — C8309 Small cell B-cell lymphoma, extranodal and solid organ sites: Secondary | ICD-10-CM

## 2018-04-18 DIAGNOSIS — B009 Herpesviral infection, unspecified: Secondary | ICD-10-CM | POA: Diagnosis not present

## 2018-04-18 NOTE — Progress Notes (Signed)
[facility]  Telephone:([phone] [phone]  Patient Care Team: Albina Billet, MD as PCP - General (Internal Medicine)   Name of the patient: Heather [name]  [id number]  [birth year]   Date of visit: 04/18/18  Diagnosis- Chronic Lymphocytic Leukemia  Chief complaint/ Reason for visit- Wound check  Heme/Onc history:  Oncology History   1. Ultrasound revealed abdominal lymphadenopathy(March, 2013) 2. Biopsy as well as peripheral blood be suggestive of chronic lymphocytic leukemia (April, 2013) 3. Started on bendamustin and Rituxan from May of 2013 4. Has finished her 6 cycles of chemotherapy with Rituxan and bendamustine (October 26, 2011) 5.recurrent and progressive disease with progressive anemia.  Patient was started on IBRUTANIB (March, 2016)  taking 140 mg 3 pills a day  6. Patient was taken off IBRUTANIB in August of 2016 because of persistent myelosuppression even with reducing dose 7. Because of rising WBC count and anemia patient would be started back on a lower dose of improved on ibrutanib 140 mg 2 tablets a day; JAN 2020- stop Ibrutinib- sec to progression; BMBx- CLL; kayroptype-N; FISH-pending.   # FEB 18th 2020- start Venatoclax.  -----------------------------------------------------   DIAGNOSIS: [ ]  CLL  STAGE: 4 ; GOALS: Control  CURRENT/MOST RECENT THERAPY- Venatoclax.       Chronic lymphocytic leukemia ([place])   06/06/2014 Initial Diagnosis    Chronic lymphocytic leukemia     Interval history- Heather [name], 85 year old female with above history of CLL, presents to [facility] for evaluation of perianal wound. She has previously been seen by general surgery, Dr. Bary Castilla, and wound care, Dr. Dellia Nims, for same. She was seen in [facility] on 03/29/2018 and underwent punch biopsy. Pathology showed: atypical lymphoid infiltrate consistent with chronic lymphocytic leukemia/small lymphocytic lymphoma.  Viral culture was positive for herpes simplex virus type-2. She was started on valacyclovir 1000mg  BID x 10 days. She was referred to Dr. Chrystal/radiation-oncology and seen on 04/07/2018 for consult and underwent simulation on 04/13/2018 with plans to receive treatment to area on 04/20/2018-04/21/2018.   Patient is a poor historian and is accompanied by her sister today who contributes little but does say that patient complains of rectal pain less than previously. Patient says that she does not have pain and previously complained of significant pain and was unable to sit directly on buttocks without discomfort. Patient's sister says that her other sister, Heather [name], provides most of patient's care. Patient is unaware of how long wound has been present but per sister, likely greater than 1-2 months.   ECOG FS:3 - Symptomatic, >50% confined to bed  Review of systems- Review of Systems  Reason unable to perform ROS: hpi per patient and sister but limited d/t memory loss.  Constitutional: Positive for malaise/fatigue and weight loss. Negative for chills, diaphoresis and fever.  Eyes: Negative.   Respiratory: Negative.   Cardiovascular: Negative.   Gastrointestinal: Negative for abdominal pain, blood in stool, constipation, diarrhea and melena.  Genitourinary: Negative for dysuria, frequency, hematuria and urgency.  Musculoskeletal: Negative for falls.       Wheelchair at baseline  Skin:       Per hpi  Neurological: Positive for weakness.  Psychiatric/Behavioral: Positive for memory loss. The patient is nervous/anxious.     Current treatment- venetoclax  Allergies  Allergen Reactions  . Losartan Other (See Comments)    Causes State of anxiety and hopelessness. Reported to RN on 03/08/18 that she is no longer taking this medication.  Past Medical History:  Diagnosis Date  . Anemia   . Cataracts, bilateral   . Chronic lymphocytic leukemia ([place]) 06/06/2014  . Hearing loss   . History of  chemotherapy   . Hyperlipidemia   . Hypertension   . Hypothyroidism   . Palpitations   . Risk for falls   . Thrombocytopenia ([place])     Past Surgical History:  Procedure Laterality Date  . CATARACT EXTRACTION Left 03/29/2012  . Excision left cervical node biopsy  05/04/2011    Social History   Socioeconomic History  . Marital status: Widowed    Spouse name: Not on file  . Number of children: Not on file  . Years of education: Not on file  . Highest education level: Not on file  Occupational History  . Not on file  Social Needs  . Financial resource strain: Not on file  . Food insecurity:    Worry: Never true    Inability: Never true  . Transportation needs:    Medical: No    Non-medical: No  Tobacco Use  . Smoking status: Never Smoker  . Smokeless tobacco: Never Used  Substance and Sexual Activity  . Alcohol use: No    Alcohol/week: 0.0 standard drinks  . Drug use: No  . Sexual activity: Not Currently  Lifestyle  . Physical activity:    Days per week: 0 days    Minutes per session: 0 min  . Stress: Only a little  Relationships  . Social connections:    Talks on phone: More than three times a week    Gets together: Three times a week    Attends religious service: 1 to 4 times per year    Active member of club or organization: Not on file    Attends meetings of clubs or organizations: Not on file    Relationship status: Widowed  . Intimate partner violence:    Fear of current or ex partner: Not on file    Emotionally abused: No    Physically abused: No    Forced sexual activity: Not on file  Other Topics Concern  . Not on file  Social History Narrative  . Not on file    Family History  Problem Relation Age of Onset  . Heart disease Mother   . Stomach cancer Father   . Diabetes Sister     Current Outpatient Medications:  .  allopurinol (ZYLOPRIM) 300 MG tablet, Take 1 tablet (300 mg total) by mouth 2 (two) times daily., Disp: 120 tablet, Rfl: 0 .   levothyroxine (SYNTHROID, LEVOTHROID) 75 MCG tablet, Take 75 mcg by mouth daily before breakfast. Only takes Saturday- Thursday. Hold synthroid on Friday, Disp: , Rfl:  .  metoprolol tartrate (LOPRESSOR) 25 MG tablet, Take 1 tablet (25 mg total) by mouth 2 (two) times daily., Disp: 60 tablet, Rfl: 0 .  venetoclax (VENCLEXTA STARTING PACK) 10 & 50 & 100 MG TBPK, Take 20mg  by mouth daily for week one, 50mg  daily for week 2, 100mg  daily for week 3, 200mg  daily for week 4. Take as directed., Disp: 42 each, Rfl: 0 .  venetoclax (VENCLEXTA) 10 MG TABS, Take 20 mg by mouth daily. Take with food and a full glass of water., Disp: 14 tablet, Rfl: 0  Physical exam:  Vitals:   04/18/18 1324  BP: 123/67  Pulse: 93  Resp: 16  Temp: 98.3 F (36.8 C)  TempSrc: Tympanic   Physical Exam Constitutional:      General: She  is not in acute distress.    Comments: Frail, elderly. Wheelchair. Requires assistance for mobility. Accompanied by sister.   Cardiovascular:     Rate and Rhythm: Normal rate and regular rhythm.  Pulmonary:     Effort: Pulmonary effort is normal.     Breath sounds: Normal breath sounds.  Abdominal:     General: There is no distension.     Palpations: Abdomen is soft.     Tenderness: There is no abdominal tenderness.  Musculoskeletal:     Comments: weak  Skin:    General: Skin is warm and dry.  Neurological:     Mental Status: Mental status is at baseline.     Motor: Weakness present.  Psychiatric:        Mood and Affect: Mood is anxious.        Behavior: Behavior is cooperative.        Cognition and Memory: Memory is impaired.      CMP Latest Ref Rng & Units 03/29/2018  Glucose 70 - 99 mg/dL 118(H)  BUN 8 - 23 mg/dL 13  Creatinine 0.44 - 1.00 mg/dL 0.86  Sodium 135 - 145 mmol/L 135  Potassium 3.5 - 5.1 mmol/L 4.0  Chloride 98 - 111 mmol/L 103  CO2 22 - 32 mmol/L 26  Calcium 8.9 - 10.3 mg/dL 9.5  Total Protein 6.5 - 8.1 g/dL -  Total Bilirubin 0.3 - 1.2 mg/dL -   Alkaline Phos 38 - 126 U/L -  AST 15 - 41 U/L -  ALT 0 - 44 U/L -   CBC Latest Ref Rng & Units 03/29/2018  WBC 4.0 - 10.5 K/uL 3.5(L)  Hemoglobin 12.0 - 15.0 g/dL 9.8(L)  Hematocrit 36.0 - 46.0 % 30.9(L)  Platelets 150 - 400 K/uL 102(L)      No results found.  Assessment and plan- Patient is a 85 y.o. female diagnosed with    1. Lymphoma infiltrate of skin - perianal wound pathology consistent with atypical infiltrate consistent with CLL. Biopsy was incidentally performed twice (once by myself and once by Dr. Seward Meth care), one reviewed at [facility] and one at [facility] lab with findings consistent between sites. She received amoxil for antibiotic prophylaxis which she has completed. Today, wound appears overall improved, less moist & macerated and significantly less foul smelling drainage. It also appears to be less painful to patient as she is sitting squarely and does not complain of pain today. Discussed with Dr. Dellia Nims, Dr. Rogue Bussing, and Dr. Baruch Gouty who recommend proceeding with planned palliative radiation- 400 cGy in 2 fractions (on 4/1 & 04/21/2018). Dr. Dellia Nims will see her for follow-up in approximately 3 weeks. Continue topical wound care treatments as previously prescribed in interval.   2. 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   Will plan for her to rtc in approximately 2 weeks for follow up with Dr. Rogue Bussing. Follow up with Dr. Dellia Nims around 05/18/2018. Follow up with Dr. Baruch Gouty as scheduled for re-evaluation on 05/27/2018. I won't schedule follow up for her in [facility] at this time but can see her back as needed. Thank you for allowing me to participate in this interesting case.   Visit Diagnosis 1. Chronic lymphocytic leukemia ([place])   2. 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 HSV-2 (herpes simplex virus 2) infection     Patient expressed understanding and was in agreement  with this plan. She also understands  that She can call clinic at any time with any questions, concerns, or complaints.   Thank you for allowing me to participate in the care of this very pleasant patient.   Beckey Rutter, DNP, AGNP-C [facility] (work cell) [phone] (office)  CC: Dr. Rogue Bussing Dr. Baruch Gouty Dr. Dellia Nims

## 2018-04-19 ENCOUNTER — Other Ambulatory Visit: Payer: Self-pay

## 2018-04-20 ENCOUNTER — Ambulatory Visit: Payer: Medicare Other | Admitting: Internal Medicine

## 2018-04-20 ENCOUNTER — Ambulatory Visit
Admission: RE | Admit: 2018-04-20 | Discharge: 2018-04-20 | Disposition: A | Discharge status: Home / Self Care | Payer: Medicare Other | Source: Ambulatory Visit | Attending: Radiation Oncology | Admitting: Radiation Oncology

## 2018-04-20 ENCOUNTER — Other Ambulatory Visit: Payer: Self-pay

## 2018-04-20 DIAGNOSIS — Z79890 Hormone replacement therapy (postmenopausal): Secondary | ICD-10-CM | POA: Diagnosis not present

## 2018-04-20 DIAGNOSIS — Z9221 Personal history of antineoplastic chemotherapy: Secondary | ICD-10-CM | POA: Insufficient documentation

## 2018-04-20 DIAGNOSIS — E039 Hypothyroidism, unspecified: Secondary | ICD-10-CM | POA: Insufficient documentation

## 2018-04-20 DIAGNOSIS — Z79899 Other long term (current) drug therapy: Secondary | ICD-10-CM | POA: Insufficient documentation

## 2018-04-20 DIAGNOSIS — I10 Essential (primary) hypertension: Secondary | ICD-10-CM | POA: Insufficient documentation

## 2018-04-20 DIAGNOSIS — C9110 Chronic lymphocytic leukemia of B-cell type not having achieved remission: Secondary | ICD-10-CM | POA: Insufficient documentation

## 2018-04-20 DIAGNOSIS — Z510 Encounter for antineoplastic radiation therapy: Secondary | ICD-10-CM | POA: Diagnosis not present

## 2018-04-21 ENCOUNTER — Ambulatory Visit
Admission: RE | Admit: 2018-04-21 | Discharge: 2018-04-21 | Disposition: A | Discharge status: Home / Self Care | Payer: Medicare Other | Source: Ambulatory Visit | Attending: Radiation Oncology | Admitting: Radiation Oncology

## 2018-04-21 ENCOUNTER — Other Ambulatory Visit: Payer: Self-pay

## 2018-04-21 DIAGNOSIS — Z510 Encounter for antineoplastic radiation therapy: Secondary | ICD-10-CM | POA: Diagnosis not present

## 2018-04-22 ENCOUNTER — Ambulatory Visit: Payer: Medicare Other

## 2018-04-22 ENCOUNTER — Other Ambulatory Visit: Payer: Self-pay | Admitting: *Deleted

## 2018-04-22 DIAGNOSIS — C8589 Other specified types of non-Hodgkin lymphoma, extranodal and solid organ sites: Principal | ICD-10-CM

## 2018-04-22 DIAGNOSIS — C8309 Small cell B-cell lymphoma, extranodal and solid organ sites: Secondary | ICD-10-CM

## 2018-04-22 DIAGNOSIS — B009 Herpesviral infection, unspecified: Secondary | ICD-10-CM | POA: Insufficient documentation

## 2018-04-27 LAB — MISC LABCORP TEST (SEND OUT)
LABCORP TEST CODE: 8573
LabCorp test name: 8573

## 2018-05-06 ENCOUNTER — Other Ambulatory Visit: Payer: Self-pay

## 2018-05-06 ENCOUNTER — Telehealth: Payer: Self-pay | Admitting: *Deleted

## 2018-05-06 ENCOUNTER — Inpatient Hospital Stay: Payer: Medicare Other | Attending: Internal Medicine

## 2018-05-06 ENCOUNTER — Encounter: Payer: Self-pay | Admitting: Internal Medicine

## 2018-05-06 ENCOUNTER — Inpatient Hospital Stay: Payer: Medicare Other

## 2018-05-06 ENCOUNTER — Inpatient Hospital Stay (HOSPITAL_BASED_OUTPATIENT_CLINIC_OR_DEPARTMENT_OTHER): Payer: Medicare Other | Admitting: Hospice and Palliative Medicine

## 2018-05-06 ENCOUNTER — Inpatient Hospital Stay (HOSPITAL_BASED_OUTPATIENT_CLINIC_OR_DEPARTMENT_OTHER): Payer: Medicare Other | Admitting: Internal Medicine

## 2018-05-06 DIAGNOSIS — E039 Hypothyroidism, unspecified: Secondary | ICD-10-CM | POA: Diagnosis not present

## 2018-05-06 DIAGNOSIS — C8309 Small cell B-cell lymphoma, extranodal and solid organ sites: Secondary | ICD-10-CM

## 2018-05-06 DIAGNOSIS — R630 Anorexia: Secondary | ICD-10-CM | POA: Insufficient documentation

## 2018-05-06 DIAGNOSIS — C8589 Other specified types of non-Hodgkin lymphoma, extranodal and solid organ sites: Secondary | ICD-10-CM

## 2018-05-06 DIAGNOSIS — C9110 Chronic lymphocytic leukemia of B-cell type not having achieved remission: Secondary | ICD-10-CM

## 2018-05-06 DIAGNOSIS — R634 Abnormal weight loss: Secondary | ICD-10-CM | POA: Diagnosis not present

## 2018-05-06 DIAGNOSIS — Z515 Encounter for palliative care: Secondary | ICD-10-CM

## 2018-05-06 LAB — CBC WITH DIFFERENTIAL/PLATELET
Abs Immature Granulocytes: 0.02 10*3/uL (ref 0.00–0.07)
Basophils Absolute: 0 10*3/uL (ref 0.0–0.1)
Basophils Relative: 1 %
Eosinophils Absolute: 0.1 10*3/uL (ref 0.0–0.5)
Eosinophils Relative: 4 %
HCT: 30.8 % — ABNORMAL LOW (ref 36.0–46.0)
Hemoglobin: 9.6 g/dL — ABNORMAL LOW (ref 12.0–15.0)
Immature Granulocytes: 1 %
Lymphocytes Relative: 64 %
Lymphs Abs: 2.5 10*3/uL (ref 0.7–4.0)
MCH: 29.8 pg (ref 26.0–34.0)
MCHC: 31.2 g/dL (ref 30.0–36.0)
MCV: 95.7 fL (ref 80.0–100.0)
Monocytes Absolute: 0.3 10*3/uL (ref 0.1–1.0)
Monocytes Relative: 9 %
Neutro Abs: 0.8 10*3/uL — ABNORMAL LOW (ref 1.7–7.7)
Neutrophils Relative %: 21 %
Platelets: 102 10*3/uL — ABNORMAL LOW (ref 150–400)
RBC: 3.22 MIL/uL — ABNORMAL LOW (ref 3.87–5.11)
RDW: 22 % — ABNORMAL HIGH (ref 11.5–15.5)
WBC: 3.8 10*3/uL — ABNORMAL LOW (ref 4.0–10.5)
nRBC: 1.3 % — ABNORMAL HIGH (ref 0.0–0.2)

## 2018-05-06 LAB — COMPREHENSIVE METABOLIC PANEL
ALT: 26 U/L (ref 0–44)
AST: 40 U/L (ref 15–41)
Albumin: 4.1 g/dL (ref 3.5–5.0)
Alkaline Phosphatase: 56 U/L (ref 38–126)
Anion gap: 7 (ref 5–15)
BUN: 16 mg/dL (ref 8–23)
CO2: 26 mmol/L (ref 22–32)
Calcium: 11.3 mg/dL — ABNORMAL HIGH (ref 8.9–10.3)
Chloride: 104 mmol/L (ref 98–111)
Creatinine, Ser: 0.98 mg/dL (ref 0.44–1.00)
GFR calc Af Amer: >60 mL/min (ref >60)
GFR calc non Af Amer: 52 mL/min — ABNORMAL LOW (ref >60)
Glucose, Bld: 104 mg/dL — ABNORMAL HIGH (ref 70–99)
Potassium: 3.6 mmol/L (ref 3.5–5.1)
Sodium: 137 mmol/L (ref 135–145)
Total Bilirubin: 1.8 mg/dL — ABNORMAL HIGH (ref 0.3–1.2)
Total Protein: 6.7 g/dL (ref 6.5–8.1)

## 2018-05-06 LAB — LACTATE DEHYDROGENASE: LDH: 262 U/L — ABNORMAL HIGH (ref 98–192)

## 2018-05-06 MED ORDER — ZOLEDRONIC ACID 4 MG/100ML IV SOLN
4.0000 mg | Freq: Once | INTRAVENOUS | Status: AC
  Administered 2018-05-06: 12:00:00 4 mg via INTRAVENOUS
  Filled 2018-05-06: qty 100

## 2018-05-06 MED ORDER — SODIUM CHLORIDE 0.9 % IV SOLN
Freq: Once | INTRAVENOUS | Status: AC
  Administered 2018-05-06: 12:00:00 via INTRAVENOUS
  Filled 2018-05-06: qty 250

## 2018-05-06 NOTE — Progress Notes (Signed)
Heather [name] OFFICE PROGRESS NOTE  Patient Care Team: Albina Billet, MD as PCP - General (Internal Medicine)  Cancer Staging No matching staging information was found for the patient.   Oncology History   1. Ultrasound revealed abdominal lymphadenopathy(March, 2013) 2. Biopsy as well as peripheral blood be suggestive of chronic lymphocytic leukemia (April, 2013) 3. Started on bendamustin and Rituxan from May of 2013 4. Has finished her 6 cycles of chemotherapy with Rituxan and bendamustine (October 26, 2011) 5.recurrent and progressive disease with progressive anemia.  Patient was started on IBRUTANIB (March, 2016)  taking 140 mg 3 pills a day  6. Patient was taken off IBRUTANIB in August of 2016 because of persistent myelosuppression even with reducing dose 7. Because of rising WBC count and anemia patient would be started back on a lower dose of improved on ibrutanib 140 mg 2 tablets a day; JAN 2020- stop Ibrutinib- sec to progression; BMBx- CLL; kayroptype-N; FISH-pending.   # FEB 18th 2020- start Venatoclax.;  Discontinued after a week #1 [perianal ulceration/]  #April 17 hypercalcemia calcium 11-status post Zometa  #Perianal ulceration-status post biopsy CLL HSV-status post radiation/valacyclovir. -----------------------------------------------------   DIAGNOSIS: [ ]  CLL  STAGE: 4 ; GOALS: Control  CURRENT/MOST RECENT THERAPY- Venatoclax.       Chronic lymphocytic leukemia ([place])   06/06/2014 Initial Diagnosis    Chronic lymphocytic leukemia     INTERVAL HISTORY: Patient a poor historian.  Heather [name] 85 y.o.  female pleasant patient above history of CLL most currently on venetoclax 20mg /day [started 2/18]-while hospital.  Patient venetoclax is currently on hold after cycle #1 because of perianal ulceration.  Perianal ulceration biopsy positive for CLL/HSV.  Status post radiation and also status post valacyclovir.  Improved.  Appetite poor.   Weight loss.  No nausea no vomiting.   Review of Systems  Constitutional: Positive for malaise/fatigue and weight loss. Negative for chills, diaphoresis and fever.  HENT: Negative for nosebleeds and sore throat.   Eyes: Negative for double vision.  Respiratory: Positive for shortness of breath (Shortness of breath only with exertion.). Negative for cough, hemoptysis, sputum production and wheezing.   Cardiovascular: Negative for chest pain, palpitations, orthopnea and leg swelling.  Gastrointestinal: Negative for abdominal pain, blood in stool, constipation, diarrhea, heartburn, melena, nausea and vomiting.  Genitourinary: Negative for dysuria, frequency and urgency.  Musculoskeletal: Negative for back pain and joint pain.  Skin: Negative.  Negative for itching and rash.  Neurological: Negative for dizziness, tingling, focal weakness, weakness and headaches.  Endo/Heme/Allergies: Does not bruise/bleed easily.  Psychiatric/Behavioral: Negative for depression. The patient is not nervous/anxious and does not have insomnia.       PAST MEDICAL HISTORY :  Past Medical History:  Diagnosis Date  . Anemia   . Cataracts, bilateral   . Chronic lymphocytic leukemia ([place]) 06/06/2014  . Hearing loss   . History of chemotherapy   . Hyperlipidemia   . Hypertension   . Hypothyroidism   . Palpitations   . Risk for falls   . Thrombocytopenia ([place])     PAST SURGICAL HISTORY :   Past Surgical History:  Procedure Laterality Date  . CATARACT EXTRACTION Left 03/29/2012  . Excision left cervical node biopsy  05/04/2011    FAMILY HISTORY :   Family History  Problem Relation Age of Onset  . Heart disease Mother   . Stomach cancer Father   . Diabetes Sister     SOCIAL HISTORY:   Social History  Tobacco Use  . Smoking status: Never Smoker  . Smokeless tobacco: Never Used  Substance Use Topics  . Alcohol use: No    Alcohol/week: 0.0 standard drinks  . Drug use: No    ALLERGIES:  is  allergic to losartan.  MEDICATIONS:  Current Outpatient Medications  Medication Sig Dispense Refill  . levothyroxine (SYNTHROID, LEVOTHROID) 75 MCG tablet Take 75 mcg by mouth daily before breakfast. Only takes Saturday- Thursday. Hold synthroid on [name]    . metoprolol tartrate (LOPRESSOR) 25 MG tablet Take 1 tablet (25 mg total) by mouth 2 (two) times daily. 60 tablet 0  . allopurinol (ZYLOPRIM) 300 MG tablet Take 1 tablet (300 mg total) by mouth 2 (two) times daily. (Patient not taking: Reported on 05/09/2018) 120 tablet 0  . venetoclax (VENCLEXTA STARTING PACK) 10 & 50 & 100 MG TBPK Take 20mg  by mouth daily for week one, 50mg  daily for week 2, 100mg  daily for week 3, 200mg  daily for week 4. Take as directed. (Patient not taking: Reported on 05/06/2018) 42 each 0  . venetoclax (VENCLEXTA) 10 MG TABS Take 20 mg by mouth daily. Take with food and a full glass of water. (Patient not taking: Reported on 05/06/2018) 14 tablet 0   No current facility-administered medications for this visit.     PHYSICAL EXAMINATION: ECOG PERFORMANCE STATUS: 2 - Symptomatic, <50% confined to bed  BP 128/68   Pulse 97   Temp 99.1 F (37.3 C) (Tympanic)   Resp 20   Ht 5\' 2"  (1.575 m)   Wt 127 lb (57.6 kg)   BMI 23.23 kg/m   Filed Weights   05/06/18 1050  Weight: 127 lb (57.6 kg)    Physical Exam  Constitutional: She is oriented to person, place, and time and well-developed, well-nourished, and in no distress.  Elderly frail female patient she is walking with a walker.  Accompanied by her sister.  HENT:  Head: Normocephalic and atraumatic.  Mouth/Throat: Oropharynx is clear and moist. No oropharyngeal exudate.  Eyes: Pupils are equal, round, and reactive to light.  Neck: Normal range of motion. Neck supple.  Cardiovascular: Normal rate and regular rhythm.  Pulmonary/Chest: No respiratory distress. She has no wheezes.  Abdominal: Soft. Bowel sounds are normal. She exhibits no distension and no mass.  There is no abdominal tenderness. There is no rebound and no guarding.  Musculoskeletal: Normal range of motion.        General: No tenderness or edema.     Comments: Mild bilateral leg swelling.  Neurological: She is alert and oriented to person, place, and time.  Skin: Skin is warm.  Psychiatric: Affect normal.      LABORATORY DATA:  I have reviewed the data as listed    Component Value Date/Time   NA 137 05/06/2018 1004   NA 133 (L) 05/08/2014 1349   K 3.6 05/06/2018 1004   K 3.6 05/08/2014 1349   CL 104 05/06/2018 1004   CL 102 05/08/2014 1349   CO2 26 05/06/2018 1004   CO2 26 05/08/2014 1349   GLUCOSE 104 (H) 05/06/2018 1004   GLUCOSE 82 05/08/2014 1349   BUN 16 05/06/2018 1004   BUN 18 05/08/2014 1349   CREATININE 0.98 05/06/2018 1004   CREATININE 1.13 (H) 05/08/2014 1349   CALCIUM 11.3 (H) 05/06/2018 1004   CALCIUM 9.1 05/08/2014 1349   PROT 6.7 05/06/2018 1004   PROT 6.7 05/08/2014 1349   ALBUMIN 4.1 05/06/2018 1004   ALBUMIN 4.2 05/08/2014 1349  AST 40 05/06/2018 1004   AST 24 05/08/2014 1349   ALT 26 05/06/2018 1004   ALT 13 (L) 05/08/2014 1349   ALKPHOS 56 05/06/2018 1004   ALKPHOS 41 05/08/2014 1349   BILITOT 1.8 (H) 05/06/2018 1004   BILITOT 1.3 (H) 05/08/2014 1349   GFRNONAA 52 (L) 05/06/2018 1004   GFRNONAA 45 (L) 05/08/2014 1349   GFRAA >60 05/06/2018 1004   GFRAA 52 (L) 05/08/2014 1349    No results found for: SPEP, UPEP  Lab Results  Component Value Date   WBC 3.8 (L) 05/06/2018   NEUTROABS 0.8 (L) 05/06/2018   HGB 9.6 (L) 05/06/2018   HCT 30.8 (L) 05/06/2018   MCV 95.7 05/06/2018   PLT 102 (L) 05/06/2018      Chemistry      Component Value Date/Time   NA 137 05/06/2018 1004   NA 133 (L) 05/08/2014 1349   K 3.6 05/06/2018 1004   K 3.6 05/08/2014 1349   CL 104 05/06/2018 1004   CL 102 05/08/2014 1349   CO2 26 05/06/2018 1004   CO2 26 05/08/2014 1349   BUN 16 05/06/2018 1004   BUN 18 05/08/2014 1349   CREATININE 0.98  05/06/2018 1004   CREATININE 1.13 (H) 05/08/2014 1349      Component Value Date/Time   CALCIUM 11.3 (H) 05/06/2018 1004   CALCIUM 9.1 05/08/2014 1349   ALKPHOS 56 05/06/2018 1004   ALKPHOS 41 05/08/2014 1349   AST 40 05/06/2018 1004   AST 24 05/08/2014 1349   ALT 26 05/06/2018 1004   ALT 13 (L) 05/08/2014 1349   BILITOT 1.8 (H) 05/06/2018 1004   BILITOT 1.3 (H) 05/08/2014 1349       RADIOGRAPHIC STUDIES: I have personally reviewed the radiological images as listed and agreed with the findings in the report. No results found.   ASSESSMENT & PLAN:  Chronic lymphocytic leukemia ([place]) # CLL-most recently on Ibrutinib. PET scan OCT 2019- moderate splenomegaly; slightly increased in size [compared to CT scan July 2019]; January 2020 MRI-approximately 11 cm splenic lesion/chronic but slowly growing; also about inch in size splenic lesion-new.  Worsening/new onset of hypercalcemia  # Continue to HOLD systemic therapy given overall poor tolerance/ delcline in PS.  Given new onset of hypercalcemia concerning for disease progression.  See below.  #Perianal CLL/ superinfection with HSV- s/p valacyclovir- s/p RT. improved.  # weight loss- ? Related to malignancy/ hypercalcemia- referral to [facility].    # Hypercalcemia- 11.3 ?malignancy related.  Etiology/ albumin-4.1. recommend Zometa.  Recommend checking PTH hormone.   # Hypothyroidism-on Synthroid- F4-normal/feb 2020. STABLE.   Overall prognosis: If patient has hypercalcemia from malignancy prognosis is extremely poor.  Discussed with [employer].  Proceed with palliative care evaluation today.   # DISPOSITION:  # Zometa today # follow up in 2 weeks/labs-cbc/bmp/MD- possible zometa-B     No orders of the defined types were placed in this encounter.  All questions were answered. The patient knows to call the clinic with any problems, questions or concerns.      Cammie Sickle, MD 05/10/2018 9:43 AM

## 2018-05-06 NOTE — Assessment & Plan Note (Addendum)
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 PET scan OCT 2019- moderate splenomegaly; slightly increased in size [compared to CT scan July 2019]; January 2020 MRI-approximately 11 cm splenic lesion/chronic but slowly growing; also about inch in size splenic lesion-new.  Worsening/new onset of hypercalcemia  # Continue to HOLD systemic therapy given overall poor tolerance/ delcline in PS.  Given new onset of hypercalcemia concerning for disease progression.  See below.  #Perianal CLL/ superinfection with HSV- s/p valacyclovir- s/p RT. improved.  # weight loss- ? Related to malignancy/ hypercalcemia- referral to [facility]    # Hypercalcemia- 11.3 ?malignancy related.  Etiology/ albumin-4.1. recommend Zometa.  Recommend checking PTH hormone.   # Hypothyroidism-on Synthroid- F4-normal/feb 2020. STABLE.   Overall prognosis: If patient has hypercalcemia from malignancy prognosis is extremely poor.  Discussed with [employer].  Proceed with palliative care evaluation today.   # DISPOSITION:  # Zometa today # follow up in 2 weeks/labs-cbc/bmp/MD- possible zometa-B

## 2018-05-06 NOTE — Progress Notes (Signed)
[facility]  Telephone:([phone] [phone]   Name: Heather [name] Date: 05/06/2018 MRN: [id number]  DOB: Aug 30, 1928  Patient Care Team: Albina Billet, MD as PCP - General (Internal Medicine)    REASON FOR CONSULTATION: Palliative Care consult requested for this 85 y.o. female with multiple medical problems including stage IV CLL most recently treated with Vanatoclax.  Most recent MRI in January 2020 showed her chronic splenic lesion to be slow-growing but also had a new splenic lesion noted.  Patient was hospitalized 03/08/2018 -03/10/2018 for monitoring of tumor lysis syndrome after starting Venetoclax. 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  Patient was referred to palliative care to help discuss goals.  SOCIAL HISTORY:     reports that she has never smoked. She has never used smokeless tobacco. She reports that she does not drink alcohol or use drugs.  Patient is widowed.  She lives at home alone.  She has no children.  She has sisters who are involved and bring her back and forth to medical appointments.  ADVANCE DIRECTIVES:  Does not have  CODE STATUS: Previously a DNR during last hospitalization  PAST MEDICAL HISTORY: Past Medical History:  Diagnosis Date  . Anemia   . Cataracts, bilateral   . Chronic lymphocytic leukemia ([place]) 06/06/2014  . Hearing loss   . History of chemotherapy   . Hyperlipidemia   . Hypertension   . Hypothyroidism   . Palpitations   . Risk for falls   . Thrombocytopenia ([place])     PAST SURGICAL HISTORY:  Past Surgical History:  Procedure Laterality Date  . CATARACT EXTRACTION Left 03/29/2012  . Excision left cervical node biopsy  05/04/2011    HEMATOLOGY/ONCOLOGY HISTORY:  Oncology History   1. Ultrasound revealed abdominal lymphadenopathy(March, 2013)  2. Biopsy as well as peripheral blood be suggestive of chronic lymphocytic leukemia (April, 2013) 3. Started on bendamustin and Rituxan from May of 2013 4. Has finished her 6 cycles of chemotherapy with Rituxan and bendamustine (October 26, 2011) 5.recurrent and progressive disease with progressive anemia.  Patient was started on IBRUTANIB (March, 2016)  taking 140 mg 3 pills a day  6. Patient was taken off IBRUTANIB in August of 2016 because of persistent myelosuppression even with reducing dose 7. Because of rising WBC count and anemia patient would be started back on a lower dose of improved on ibrutanib 140 mg 2 tablets a day; JAN 2020- stop Ibrutinib- sec to progression; BMBx- CLL; kayroptype-N; FISH-pending.   # FEB 18th 2020- start Venatoclax.  -----------------------------------------------------   DIAGNOSIS: '[ ]'$  CLL  STAGE: 4 ; GOALS: Control  CURRENT/MOST RECENT THERAPY- Venatoclax.       Chronic lymphocytic leukemia ([place])   06/06/2014 Initial Diagnosis    Chronic lymphocytic leukemia     ALLERGIES:  is allergic to losartan.  MEDICATIONS:  Current Outpatient Medications  Medication Sig Dispense Refill  . allopurinol (ZYLOPRIM) 300 MG tablet Take 1 tablet (300 mg total) by mouth 2 (two) times daily. 120 tablet 0  . levothyroxine (SYNTHROID, LEVOTHROID) 75 MCG tablet Take 75 mcg by mouth daily before breakfast. Only takes Saturday- Thursday. Hold synthroid on Friday    . metoprolol tartrate (LOPRESSOR) 25 MG tablet Take 1 tablet (25 mg total) by mouth 2 (two) times daily. 60 tablet 0  . venetoclax (VENCLEXTA STARTING PACK) 10 & 50 & 100 MG TBPK  Take '20mg'$  by mouth daily for week one, '50mg'$  daily for week 2, '100mg'$  daily for week 3, '200mg'$  daily for week 4. Take as directed. (Patient not taking: Reported on 05/06/2018) 42 each 0  . venetoclax (VENCLEXTA) 10 MG TABS Take 20 mg by mouth daily. Take with food and a full glass of water. (Patient not taking: Reported on  05/06/2018) 14 tablet 0   No current facility-administered medications for this visit.     VITAL SIGNS: There were no vitals taken for this visit. There were no vitals filed for this visit.  Estimated body mass index is 23.23 kg/m as calculated from the following:   Height as of an earlier encounter on 05/06/18: '5\' 2"'$  (1.575 m).   Weight as of an earlier encounter on 05/06/18: 127 lb (57.6 kg).  LABS: CBC:    Component Value Date/Time   WBC 3.8 (L) 05/06/2018 1004   HGB 9.6 (L) 05/06/2018 1004   HGB 9.4 (L) 05/08/2014 1349   HCT 30.8 (L) 05/06/2018 1004   HCT 30.4 (L) 05/08/2014 1349   PLT 102 (L) 05/06/2018 1004   PLT 128 (L) 05/08/2014 1349   MCV 95.7 05/06/2018 1004   MCV 100 05/08/2014 1349   NEUTROABS 0.8 (L) 05/06/2018 1004   NEUTROABS 4.5 05/08/2014 1349   LYMPHSABS 2.5 05/06/2018 1004   LYMPHSABS 34.6 (H) 05/08/2014 1349   MONOABS 0.3 05/06/2018 1004   MONOABS 1.6 (H) 05/08/2014 1349   EOSABS 0.1 05/06/2018 1004   EOSABS 0.1 05/08/2014 1349   BASOSABS 0.0 05/06/2018 1004   BASOSABS 0.1 05/08/2014 1349   Comprehensive Metabolic Panel:    Component Value Date/Time   NA 137 05/06/2018 1004   NA 133 (L) 05/08/2014 1349   K 3.6 05/06/2018 1004   K 3.6 05/08/2014 1349   CL 104 05/06/2018 1004   CL 102 05/08/2014 1349   CO2 26 05/06/2018 1004   CO2 26 05/08/2014 1349   BUN 16 05/06/2018 1004   BUN 18 05/08/2014 1349   CREATININE 0.98 05/06/2018 1004   CREATININE 1.13 (H) 05/08/2014 1349   GLUCOSE 104 (H) 05/06/2018 1004   GLUCOSE 82 05/08/2014 1349   CALCIUM 11.3 (H) 05/06/2018 1004   CALCIUM 9.1 05/08/2014 1349   AST 40 05/06/2018 1004   AST 24 05/08/2014 1349   ALT 26 05/06/2018 1004   ALT 13 (L) 05/08/2014 1349   ALKPHOS 56 05/06/2018 1004   ALKPHOS 41 05/08/2014 1349   BILITOT 1.8 (H) 05/06/2018 1004   BILITOT 1.3 (H) 05/08/2014 1349   PROT 6.7 05/06/2018 1004   PROT 6.7 05/08/2014 1349   ALBUMIN 4.1 05/06/2018 1004   ALBUMIN 4.2 05/08/2014 1349     RADIOGRAPHIC STUDIES: No results found.  PERFORMANCE STATUS (ECOG) : 2 - Symptomatic, <50% confined to bed  Review of Systems Unless otherwise noted, a complete review of systems is negative.  Physical Exam General: NAD, frail appearing, thin Pulmonary: unlabored Extremities: no edema Skin: no rashes Neurological: oriented to person and place, weakness  IMPRESSION: I met with patient to introduce palliative care services.  Patient was somewhat confused during today's visit.  Unclear if that is related to her hypercalcemia although reportedly patient's cognition has been worsening over past weeks or months.  Patient knew she was at a doctor's office but could not immediately identify her location or who she was here to see.  She could not tell me her primary medical problems.  With some delay, she was able to identify the month  and year.  I question if patient has capacity and her current condition for complex decision-making.  I tried calling patient's sisters but did not reach them.  I waited outside for patient's family and met with a sister, Heather [name].  I updated Dorothy on patient's current medical problems including the possibility of a poor prognosis associated with cancer related hypercalcemia.  We discussed the possibility of decline in the future and that patient might be nearing end-of-life.  We discussed the possible future involvement of hospice at home.  Sister tells me that patient's home care is complicated by her confusion.  Home health is no longer following actively.  Patient does have Medicaid and would qualify for in-home resources.  I will consult home-based palliative care to see if there is social worker can assist [place] with care coordination and resources.  Case discussed with Dr. Rogue Bussing.  Plan is to monitor her condition over the next few weeks and then likely refer to hospice in the event of decline.  I spoke with sister, Heather [name], about CODE STATUS.  It does not  sound like patient has advance care plans in place or a healthcare power of attorney.  Heather [name] says she would defer decision-making to her other sister, Heather [name].  I have tried calling Heather [name] without success.  I do note that patient was a DNR when she was previously hospitalized.    PLAN: Supportive care Amb referral to palliative care RTC in 2 weeks    Time Total: 60 minutes  Visit consisted of counseling and education dealing with the complex and emotionally intense issues of symptom management and palliative care in the setting of serious and potentially life-threatening illness.Greater than 50%  of this time was spent counseling and coordinating care related to the above assessment and plan.  Signed by: Altha Harm, PhD, NP-C [phone] (Work Cell)

## 2018-05-06 NOTE — Telephone Encounter (Signed)
Patients sister called returning call to Billey Chang, NP. Secure health message sent to Billey Chang, NP and Josh to return call.

## 2018-05-09 ENCOUNTER — Other Ambulatory Visit: Payer: Medicare Other | Admitting: Student

## 2018-05-09 ENCOUNTER — Other Ambulatory Visit: Payer: Self-pay

## 2018-05-09 DIAGNOSIS — Z515 Encounter for palliative care: Secondary | ICD-10-CM

## 2018-05-09 NOTE — Progress Notes (Signed)
Heather [name] Telephone: [phone]  Fax: [phone]  PATIENT NAME: Heather [name] DOB: September 07, 1928 MRN: [id number]  PRIMARY [name] PROVIDER:   Albina Billet, MD  REFERRING PROVIDER:  Altha Harm, NP  RESPONSIBLE PARTY: Heather [name]  ASSESSMENT: Due to the COVID-19 crisis, this visit was done via telemedicine from my office and it was initiated and consent by this patient and or family. This visit was conducted via telephone due to patient having no access to smart device. NP explained role of Palliative Medicine. Heather [name] was able to answer direct questions, although some confusion noted. Her Heather Heather [name] was present with her and her Heather Heather [name] was called to discuss further. Attempted to discuss goals of [name]; Heather [name] was not clear on this. Heather [name] states that patient would want to continue treatment once her wound had healed. We discussed if patient would be able to tolerate treatment Venetoclax due to her current fatigue and weakness. Discussed symptom management; poor appetite. Heather [name] states patient has tried ensure nutritional supplement in the past, but she would barely drink them. We also discussed need for additional support in the home as patient lives alone. Attempted to discuss code status; Heather [name] states she is not sure what patient would want and defers back to Heather Heather [name]; Heather [name] states she thinks Heather [name] would want CPR performed. This will be an ongoing discussion as CPR would likely be more harmful and not change prognosis due to her advanced cancer diagnosis. Message left for Heather [name] at Heather [name] to discuss further as patient appears to have declined some since being seen in [facility] [name] last week. Palliative NP would like to speak with Heather [name] prior to initiating conversation of Hospice as patient/family are still wanting to pursue aggressive treatment.           RECOMMENDATIONS and PLAN:  1. Code status: Family  expresses wanting CPR performed; this will be an ongoing conversation. 2. Medical goals of therapy: Follow up at [facility] [name] as scheduled. Follow up with wound clinic as scheduled on 05/18/2018. Palliative Medicine will continue to follow closely as patient is declining and may need Hospice services in the near future. 3. Symptom management: Recommendation for appetite-start mirtazapine 15mg  qhs. Offer foods that patient enjoys. Attempt Heather different brand of nutritional supplements.  4. Discharge Planning: Heather [name] will continue to reside at home, receive continue assistance from her sisters.  NP left message for Heather [name] at Heather [name].  NP will refer patient to Palliative SW in assistance with getting additional support in the home.   Palliative Medicine will follow up in 2 weeks or sooner, if needed.    I spent 45 minutes providing this consultation,  from 3:00pm to 3:45pm. More than 50% of the time in this consultation was spent coordinating communication.   HISTORY OF PRESENT ILLNESS:  Heather [name] is Heather 85 y.o.  female with multiple medical problems including stage IV CLL; most recently treated with Venetoclax. MRI in January 2020 shows chronic splenic lesion to be slow-growing, but she also had Heather new splenic lesion noted. Diagnoses also include anemia, hyperlipidemia, hypertension, hypothryroidism.  Palliative [name] was asked to help address goals of [name]. Heather [name] presently lives alone. No outside agences are present. Her Heather Heather [name] states Heather [name] was last there around 3 weeks ago. Heather [name] reports feeling "more weak and tired." Heather Heather [name] states patient has been lying around more this week compared to last week.  Her appetite is poor; Heather [name] states patient is "eating like Heather bird." Heather [name] prepares meals for her. She denies pain, dyspnea, nausea or constipation. Heather [name] states her wound has improved and is no longer having to put any ointment on it; she had been using Heather [name]  ointment. Heather [name] does not have any advanced directives in place. Heather [name] defers this convseration to Heather [name] who is not at patient's home. NP spoke with Heather [name] and she states that patient talked about having paperwork put in place, but this was never done.   CODE STATUS: Full Code  PPS: 40% HOSPICE ELIGIBILITY/DIAGNOSIS: TBD  PAST MEDICAL HISTORY:  Past Medical History:  Diagnosis Date  . Anemia   . Cataracts, bilateral   . Chronic lymphocytic leukemia ([place]) 06/06/2014  . Hearing loss   . History of chemotherapy   . Hyperlipidemia   . Hypertension   . Hypothyroidism   . Palpitations   . Risk for falls   . Thrombocytopenia ([place])     SOCIAL HX:  Social History   Tobacco Use  . Smoking status: Never Smoker  . Smokeless tobacco: Never Used  Substance Use Topics  . Alcohol use: No    Alcohol/week: 0.0 standard drinks    ALLERGIES:  Allergies  Allergen Reactions  . Losartan Other (See Comments)    Causes State of anxiety and hopelessness. Reported to RN on 03/08/18 that she is no longer taking this medication.     PERTINENT MEDICATIONS:  Outpatient Encounter Medications as of 05/09/2018  Medication Sig  . allopurinol (ZYLOPRIM) 300 MG tablet Take 1 tablet (300 mg total) by mouth 2 (two) times daily.  Marland Kitchen levothyroxine (SYNTHROID, LEVOTHROID) 75 MCG tablet Take 75 mcg by mouth daily before breakfast. Only takes Saturday- Thursday. Hold synthroid on Friday  . metoprolol tartrate (LOPRESSOR) 25 MG tablet Take 1 tablet (25 mg total) by mouth 2 (two) times daily.  Marland Kitchen venetoclax (VENCLEXTA STARTING PACK) 10 & 50 & 100 MG TBPK Take 20mg  by mouth daily for week one, 50mg  daily for week 2, 100mg  daily for week 3, 200mg  daily for week 4. Take as directed. (Patient not taking: Reported on 05/06/2018)  . venetoclax (VENCLEXTA) 10 MG TABS Take 20 mg by mouth daily. Take with food and Heather full glass of water. (Patient not taking: Reported on 05/06/2018)   No facility-administered encounter  medications on file as of 05/09/2018.     PHYSICAL EXAM:   Physical exam deferred.  Ezekiel Slocumb, NP

## 2018-05-12 ENCOUNTER — Telehealth: Payer: Self-pay

## 2018-05-12 NOTE — Telephone Encounter (Signed)
SW contacted patient and Heather [name] (patient's sister) to follow-up on referral from [facility], Palliative Care NP. Patient reports she is doing "okay". SW provided education on Palliative Care and SW role. Patient deferred to her sister, Heather [name]. SW talked with Heather [name] about patient's care at home. Heather [name] said patient lives alone but Heather [name] is there during the day. Heather [name] shared that she is providing hands-on patient's care, assisting with meals and errands, and helping to coordinate medical visits. Heather [name] said it is difficult to manage at times. SW provided education on [employer] services and Heather [name] expressed interest in this service and request that SW complete referral. Heather [name] confirmed that they have plenty of food, medicines, etc. Denies any other psychosocial needs at this time. SW used active and reflective listening, and provided emotional support.  SW to complete referral to [facility] with Heather [name]. SW will updated Heather [name].

## 2018-05-18 ENCOUNTER — Telehealth: Payer: Self-pay

## 2018-05-18 ENCOUNTER — Encounter: Payer: Medicare Other | Attending: Internal Medicine | Admitting: Internal Medicine

## 2018-05-18 ENCOUNTER — Other Ambulatory Visit: Payer: Self-pay

## 2018-05-18 DIAGNOSIS — I10 Essential (primary) hypertension: Secondary | ICD-10-CM | POA: Insufficient documentation

## 2018-05-18 DIAGNOSIS — K644 Residual hemorrhoidal skin tags: Secondary | ICD-10-CM | POA: Insufficient documentation

## 2018-05-18 DIAGNOSIS — Z09 Encounter for follow-up examination after completed treatment for conditions other than malignant neoplasm: Secondary | ICD-10-CM | POA: Diagnosis not present

## 2018-05-18 DIAGNOSIS — K626 Ulcer of anus and rectum: Secondary | ICD-10-CM | POA: Diagnosis present

## 2018-05-18 DIAGNOSIS — Z79899 Other long term (current) drug therapy: Secondary | ICD-10-CM | POA: Insufficient documentation

## 2018-05-18 DIAGNOSIS — H9190 Unspecified hearing loss, unspecified ear: Secondary | ICD-10-CM | POA: Insufficient documentation

## 2018-05-18 DIAGNOSIS — C9110 Chronic lymphocytic leukemia of B-cell type not having achieved remission: Secondary | ICD-10-CM | POA: Diagnosis not present

## 2018-05-18 NOTE — Telephone Encounter (Signed)
SW completed [facility] referral and faxed to [facility].

## 2018-05-19 ENCOUNTER — Other Ambulatory Visit: Payer: Self-pay

## 2018-05-19 NOTE — Progress Notes (Signed)
KHARMA, SAMPSEL ([id number]) Visit Report for 05/18/2018 HPI Details Patient Name: Heather [name], Heather [name] Date of Service: 05/18/2018 12:30 PM Medical Record Number: [id number] Patient Account Number: [id number] Date of Birth/Sex: 1928-01-29 (85 y.o. F) Treating RN: Harold Barban Primary Care Provider: Benita Stabile Other Clinician: Referring Provider: Benita Stabile Treating Provider/Extender: Tito Dine in Treatment: 9 History of Present Illness HPI Description: Admission 03/16/2018 This is a frail 85 year old woman who was sent here from her oncologist when it was discovered that she had large perianal ulcerations. An appointment is also been made with general surgery. The clinic appointment with oncology was just yesterday. The patient is followed for chronic lymphocytic leukemia. She is on Venclexta however I believe that was put on hold yesterday when the ulcerations were discovered. There is very little available history here. Her sister who accompanies her said that she is aware for about the last month that the patient was complaining of pain in the perirectal area but she did not actually see anything and the patient has been taking care of things herself. The patient seems unaware of how long this is actually been present but she seems to be suggesting that it is longer than a month. In any case there is no other information. I do not believe she is ever had a colonoscopy or at least they did not tell me about this. She is in a lot of pain but states her bowel movements are formed about every second day no rectal bleeding. In general she is not doing particularly well she does not eat well she is lost weight. She has become more dependent in the last month living in a seniors residence I believe called [facility]. Past medical history includes chronic lymphocytic leukemia, tumor lysis syndrome, hearing loss, hypertension, thrombocytopenia and hypothyroidism 3/11; the  biopsy I did last week showed an atypical lymphoid infiltrate. There were features in the biopsy which raise the possibility of a monoclonal B-cell population. Suggestion was made for a repeat specimen for flow cytometry. Paradoxically she was seen at the oncology office yesterday and had another biopsy done. General surgery saw the patient on 2/27. Offered to do an internal exam. Recommended screening for HSV among other viral etiologies. 4/29; the patient returns after a prolonged hiatus however I did have several conversations with providers at the oncology clinic. The biopsy that I did and also they did showed a monoclonal B-cell proliferative population. Ultimately this turned out to be CLL. She received radiation. 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 She returns to clinic today and the wounds are considerably improved. Electronic Signature(s) Signed: 05/19/2018 7:17:18 AM By: Linton Ham MD Entered By: Linton Ham on 05/18/2018 13:06:28 Heather [name] ([id number]) -------------------------------------------------------------------------------- Physical Exam Details Patient Name: Heather [name] Date of Service: 05/18/2018 12:30 PM Medical Record Number: [id number] Patient Account Number: [id number] Date of Birth/Sex: August 21, 1928 (85 y.o. F) Treating RN: Harold Barban Primary Care Provider: Benita Stabile Other Clinician: Referring Provider: TATE, Sharlet Salina Treating Provider/Extender: Ricard Dillon Weeks in Treatment: 9 Constitutional Very wide pulse pressure. Pulse regular and within target range for patient.Marland Kitchen Respirations regular, non-labored and within target range.. Temperature is normal and within the target range for the patient.Marland Kitchen appears in no distress. Respiratory Respiratory effort is easy and symmetric bilaterally. Rate is normal at rest and on room air.. Bilateral breath sounds are clear and equal in all lobes with no wheezes, rales or  rhonchi.Marland Kitchen  Cardiovascular Heart rhythm and rate regular, without murmur or gallop. She does not appear to be dehydrated. Integumentary (Hair, Skin) No primary skin lesions are seen. Psychiatric Somewhat depressed but cognitively intact. Notes Wound exam/perianal exam. The wounds that she had last time which were a large area on the left and 2 separate areas on the right have all healed over. Truly a surprising albeit gratifying finding. She has 2 small areas on the left that are perianal. She also has an external hemorrhoid Electronic Signature(s) Signed: 05/19/2018 7:17:18 AM By: Linton Ham MD Entered By: Linton Ham on 05/18/2018 13:07:58 Heather [name], Heather [name] ([id number]) -------------------------------------------------------------------------------- Physician Orders Details Patient Name: Heather [name] Date of Service: 05/18/2018 12:30 PM Medical Record Number: [id number] Patient Account Number: [id number] Date of Birth/Sex: 02-19-1928 (85 y.o. F) Treating RN: Harold Barban Primary Care Provider: Benita Stabile Other Clinician: Referring Provider: Benita Stabile Treating Provider/Extender: Tito Dine in Treatment: 9 Verbal / Phone Orders: No Diagnosis Coding Discharge From [facility] Services o Discharge from [place] zinc based ointment to peri area 2 times a day Electronic Signature(s) Signed: 05/18/2018 4:45:14 PM By: Harold Barban Signed: 05/19/2018 7:17:18 AM By: Linton Ham MD Entered By: Harold Barban on 05/18/2018 12:58:36 Heather [name], Heather [name] ([id number]) -------------------------------------------------------------------------------- Problem List Details Patient Name: Heather [name] Date of Service: 05/18/2018 12:30 PM Medical Record Number: [id number] Patient Account Number: [id number] Date of Birth/Sex: Aug 11, 1928 (85 y.o. F) Treating RN: Harold Barban Primary Care Provider: Benita Stabile Other Clinician: Referring Provider: Benita Stabile Treating  Provider/Extender: Tito Dine in Treatment: 9 Active Problems ICD-10 Evaluated Encounter Code Description Active Date Today Diagnosis K62.6 Ulcer of anus and rectum 03/16/2018 No Yes L98.499 Non-pressure chronic ulcer of skin of other sites with 03/16/2018 No Yes unspecified severity C91.10 Chronic lymphocytic leukemia of B-cell type not having 03/16/2018 No Yes achieved remission Inactive Problems Resolved Problems Electronic Signature(s) Signed: 05/19/2018 7:17:18 AM By: Linton Ham MD Entered By: Linton Ham on 05/18/2018 13:04:46 Heather [name], Heather [name] ([id number]) -------------------------------------------------------------------------------- Progress Note Details Patient Name: Heather [name] Date of Service: 05/18/2018 12:30 PM Medical Record Number: [id number] Patient Account Number: [id number] Date of Birth/Sex: Jul 29, 1928 (85 y.o. F) Treating RN: Harold Barban Primary Care Provider: Benita Stabile Other Clinician: Referring Provider: Benita Stabile Treating Provider/Extender: Tito Dine in Treatment: 9 Subjective History of Present Illness (HPI) Admission 03/16/2018 This is a frail 85 year old woman who was sent here from her oncologist when it was discovered that she had large perianal ulcerations. An appointment is also been made with general surgery. The clinic appointment with oncology was just yesterday. The patient is followed for chronic lymphocytic leukemia. She is on Venclexta however I believe that was put on hold yesterday when the ulcerations were discovered. There is very little available history here. Her sister who accompanies her said that she is aware for about the last month that the patient was complaining of pain in the perirectal area but she did not actually see anything and the patient has been taking care of things herself. The patient seems unaware of how long this is actually been present but she seems to be suggesting that it is  longer than a month. In any case there is no other information. I do not believe she is ever had a colonoscopy or at least they did not tell me about this. She is in a lot of pain but states her bowel movements are formed about every second day no rectal bleeding. In  general she is not doing particularly well she does not eat well she is lost weight. She has become more dependent in the last month living in a seniors residence I believe called [facility] [facility]. Past medical history includes chronic lymphocytic leukemia, tumor lysis syndrome, hearing loss, hypertension, thrombocytopenia and hypothyroidism 3/11; the biopsy I did last week showed an atypical lymphoid infiltrate. There were features in the biopsy which raise the possibility of a monoclonal B-cell population. Suggestion was made for a repeat specimen for flow cytometry. Paradoxically she was seen at the oncology office yesterday and had another biopsy done. General surgery saw the patient on 2/27. Offered to do an internal exam. Recommended screening for HSV among other viral etiologies. 4/29; the patient returns after a prolonged hiatus however I did have several conversations with providers at the oncology clinic. The biopsy that I did and also they did showed a monoclonal B-cell proliferative population. Ultimately this turned out to be CLL. She received radiation. 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 She returns to clinic today and the wounds are considerably improved. Objective Constitutional Very wide pulse pressure. Pulse regular and within target range for patient.Marland Kitchen Respirations regular, non-labored and within target range.. Temperature is normal and within the target range for the patient.Marland Kitchen appears in no distress. Vitals Time Taken: 12:45 PM, Height: 62 in, Source: Stated, Weight: 135 lbs, Source: Stated, BMI: 24.7, Temperature: 97.7  F, Pulse: 97 bpm, Respiratory Rate: 16  breaths/min, Blood Pressure: 112/25 mmHg. Respiratory Respiratory effort is easy and symmetric bilaterally. Rate is normal at rest and on room air.. Bilateral breath sounds are clear Heather [name], Heather [name]. ([id number]) and equal in all lobes with no wheezes, rales or rhonchi.. Cardiovascular Heart rhythm and rate regular, without murmur or gallop. She does not appear to be dehydrated. Psychiatric Somewhat depressed but cognitively intact. General Notes: Wound exam/perianal exam. The wounds that she had last time which were a large area on the left and 2 separate areas on the right have all healed over. Truly a surprising albeit gratifying finding. She has 2 small areas on the left that are perianal. She also has an external hemorrhoid Integumentary (Hair, Skin) No primary skin lesions are seen. Wound #1 status is Open. Original cause of wound was Gradually Appeared. The wound is located on the Left,Proximal Peri- anal. The wound measures 0cm length x 0cm width x 0cm depth; 0cm^2 area and 0cm^3 volume. There is no tunneling or undermining noted. There is a none present amount of drainage noted. The wound margin is flat and intact. There is large (67-100%) pink granulation within the wound bed. There is no necrotic tissue within the wound bed. The periwound skin appearance did not exhibit: Callus, Crepitus, Excoriation, Induration, Rash, Scarring, Dry/Scaly, Maceration, Atrophie Blanche, Cyanosis, Ecchymosis, Hemosiderin Staining, Mottled, Pallor, Rubor, Erythema. Periwound temperature was noted as No Abnormality. The periwound has tenderness on palpation. Wound #2 status is Open. Original cause of wound was Gradually Appeared. The wound is located on the Left,Distal Peri- anal. The wound measures 0cm length x 0cm width x 0cm depth; 0cm^2 area and 0cm^3 volume. There is no tunneling or undermining noted. There is a none present amount of drainage noted. The wound margin is flat and intact. There is  large (67-100%) red granulation within the wound bed. There is no necrotic tissue within the wound bed. The periwound skin appearance did not exhibit: Callus, Crepitus, Excoriation, Induration, Rash, Scarring, Dry/Scaly, Maceration,  Atrophie Blanche, Cyanosis, Ecchymosis, Hemosiderin Staining, Mottled, Pallor, Rubor, Erythema. Periwound temperature was noted as No Abnormality. The periwound has tenderness on palpation. Wound #3 status is Open. Original cause of wound was Gradually Appeared. The wound is located on the Right Peri-anal. The wound measures 0cm length x 0cm width x 0cm depth; 0cm^2 area and 0cm^3 volume. There is Fat Layer (Subcutaneous Tissue) Exposed exposed. There is no tunneling or undermining noted. There is a medium amount of serous drainage noted. The wound margin is flat and intact. There is medium (34-66%) pink granulation within the wound bed. There is a medium (34-66%) amount of necrotic tissue within the wound bed including Adherent Slough. The periwound skin appearance did not exhibit: Callus, Crepitus, Excoriation, Induration, Rash, Scarring, Dry/Scaly, Maceration, Atrophie Blanche, Cyanosis, Ecchymosis, Hemosiderin Staining, Mottled, Pallor, Rubor, Erythema. Periwound temperature was noted as No Abnormality. The periwound has tenderness on palpation. Assessment Active Problems ICD-10 Ulcer of anus and rectum Non-pressure chronic ulcer of skin of other sites with unspecified severity Chronic lymphocytic leukemia of B-cell type not having achieved remission Heather [name], Heather [name] ([id number]) Plan Discharge From [facility] Services: Discharge from [place] zinc based ointment to peri area 2 times a day 1. Biopsy of the wound turned out to be CLL which she is already been treated for. She received 4 courses of radiation to this area. This seems to have healed this nicely. She also was treated for HSV although I am less confident that is what this was. In any case  her wounds are essentially healed. She still has perianal area switch could be small benign issues. She has a external hemorrhoid but her original wounds are all closed 2. I have recommended continuing to use zinc oxide or similar to the perianal area twice a day and after bowel movements to avoid trauma to the skin area. 3. The patient has a wide pulse pressure but I saw no obvious reason for this. Cardiac exam was normal 4. The patient can be discharged from our clinic Electronic Signature(s) Signed: 05/19/2018 7:17:18 AM By: Linton Ham MD Entered By: Linton Ham on 05/18/2018 13:10:06 Heather [name], Heather [name] ([id number]) -------------------------------------------------------------------------------- SuperBill Details Patient Name: Heather [name] Date of Service: 05/18/2018 Medical Record Number: [id number] Patient Account Number: [id number] Date of Birth/Sex: [birth year] (85 y.o. F) Treating RN: Harold Barban Primary Care Provider: Benita Stabile Other Clinician: Referring Provider: Benita Stabile Treating Provider/Extender: Tito Dine in Treatment: 9 Diagnosis Coding ICD-10 Codes Code Description K62.6 Ulcer of anus and rectum L98.499 Non-pressure chronic ulcer of skin of other sites with unspecified severity C91.10 Chronic lymphocytic leukemia of B-cell type not having achieved remission Facility Procedures CPT4 Code: [id number] Description: 99213 - WOUND CARE VISIT-LEV 3 EST PT Modifier: Quantity: 1 Physician Procedures CPT4 Code: 7062376 Description: 28315 - WC PHYS LEVEL 3 - EST PT ICD-10 Diagnosis Description K62.6 Ulcer of anus and rectum L98.499 Non-pressure chronic ulcer of skin of other sites with unspec C91.10 Chronic lymphocytic leukemia of B-cell type not having achiev Modifier: ified severity ed remission Quantity: 1 Electronic Signature(s) Signed: 05/19/2018 7:17:18 AM By: Linton Ham MD Entered By: Linton Ham on 05/18/2018 13:10:24

## 2018-05-19 NOTE — Progress Notes (Signed)
Heather [name], Heather [name] ([id number]) Visit Report for 05/18/2018 Arrival Information Details Patient Name: Heather [name], Heather [name] Date of Service: 05/18/2018 12:30 PM Medical Record Number: [id number] Patient Account Number: [id number] Date of Birth/Sex: [birth year] (85 y.o. F) Treating RN: Heather [name] Primary Care Heather [name]: Heather [name] Other Clinician: Referring Heather [name]: Heather [name] Treating Evertte Sones/Extender: Heather [name] in Treatment: 9 Visit Information History Since Last Visit Added or deleted any medications: No Patient Arrived: Walker Any new allergies or adverse reactions: No Arrival Time: 12:44 Had a fall or experienced change in No Accompanied By: self activities of daily living that may affect Transfer Assistance: None risk of falls: Patient Identification Verified: Yes Signs or symptoms of abuse/neglect since last visito No Secondary Verification Process Completed: Yes Hospitalized since last visit: No Has Dressing in Place as Prescribed: No Pain Present Now: No Electronic Signature(s) Signed: 05/18/2018 4:45:14 PM By: Heather [name] Entered By: Heather [name] on 05/18/2018 12:44:59 Heather [name] ([id number]) -------------------------------------------------------------------------------- Clinic Level of Care Assessment Details Patient Name: Heather [name] Date of Service: 05/18/2018 12:30 PM Medical Record Number: [id number] Patient Account Number: [id number] Date of Birth/Sex: December 09, 1928 (85 y.o. F) Treating RN: Heather [name] Primary Care Aerik Polan: Heather [name] Other Clinician: Referring Janasia Coverdale: [name], Heather Salina Treating Baili Stang/Extender: Heather [name] in Treatment: 9 Clinic Level of Care Assessment Items TOOL 4 Quantity Score []  - Use when only an EandM is performed on FOLLOW-UP visit 0 ASSESSMENTS - Nursing Assessment / Reassessment X - Reassessment of Co-morbidities (includes updates in patient status) 1 10 X- 1 5 Reassessment of Adherence to Treatment  Plan ASSESSMENTS - Wound and Skin Assessment / Reassessment X - Simple Wound Assessment / Reassessment - one wound 1 5 []  - 0 Complex Wound Assessment / Reassessment - multiple wounds []  - 0 Dermatologic / Skin Assessment (not related to wound area) ASSESSMENTS - Focused Assessment []  - Circumferential Edema Measurements - multi extremities 0 []  - 0 Nutritional Assessment / Counseling / Intervention []  - 0 Lower Extremity Assessment (monofilament, tuning fork, pulses) []  - 0 Peripheral Arterial Disease Assessment (using hand held doppler) ASSESSMENTS - Ostomy and/or Continence Assessment and Care []  - Incontinence Assessment and Management 0 []  - 0 Ostomy Care Assessment and Management (repouching, etc.) PROCESS - Coordination of Care X - Simple Patient / Family Education for ongoing care 1 15 []  - 0 Complex (extensive) Patient / Family Education for ongoing care []  - 0 Staff obtains Programmer, systems, Records, Test Results / Process Orders []  - 0 Staff telephones HHA, Nursing Homes / Clarify orders / etc []  - 0 Routine Transfer to another Facility (non-emergent condition) []  - 0 Routine Hospital Admission (non-emergent condition) []  - 0 New Admissions / Biomedical engineer / Ordering NPWT, Apligraf, etc. []  - 0 Emergency Hospital Admission (emergent condition) X- 1 10 Simple Discharge Coordination [name], [name] []  - 0 Complex (extensive) Discharge Coordination PROCESS - Special Needs []  - Pediatric / Minor Patient Management 0 []  - 0 Isolation Patient Management []  - 0 Hearing / Language / Visual special needs []  - 0 Assessment of Community assistance (transportation, D/C planning, etc.) []  - 0 Additional assistance / Altered mentation []  - 0 Support Surface(s) Assessment (bed, cushion, seat, etc.) INTERVENTIONS - Wound Cleansing / Measurement X - Simple Wound Cleansing - one wound 1 5 []  - 0 Complex Wound Cleansing - multiple wounds X- 1 5 Wound  Imaging (photographs - any number of wounds) []  - 0 Wound Tracing (instead of photographs) X- 1 5 Simple Wound  Measurement - one wound []  - 0 Complex Wound Measurement - multiple wounds INTERVENTIONS - Wound Dressings X - Small Wound Dressing one or multiple wounds 1 10 []  - 0 Medium Wound Dressing one or multiple wounds []  - 0 Large Wound Dressing one or multiple wounds X- 1 5 Application of Medications - topical []  - 0 Application of Medications - injection INTERVENTIONS - Miscellaneous []  - External ear exam 0 []  - 0 Specimen Collection (cultures, biopsies, blood, body fluids, etc.) []  - 0 Specimen(s) / Culture(s) sent or taken to Lab for analysis []  - 0 Patient Transfer (multiple staff / Civil Service fast streamer / Similar devices) []  - 0 Simple Staple / Suture removal (25 or less) []  - 0 Complex Staple / Suture removal (26 or more) []  - 0 Hypo / Hyperglycemic Management (close monitor of Blood Glucose) []  - 0 Ankle / Brachial Index (ABI) - do not check if billed separately X- 1 5 Vital Signs [name], [name] Has the patient been seen at the hospital within the last three years: Yes Total Score: 80 Level Of Care: New/Established - Level 3 Electronic Signature(s) Signed: 05/18/2018 4:45:14 PM By: Heather [name] Entered By: Heather [name] on 05/18/2018 12:59:11 Heather [name] ([id number]) -------------------------------------------------------------------------------- Encounter Discharge Information Details Patient Name: Heather [name] Date of Service: 05/18/2018 12:30 PM Medical Record Number: [id number] Patient Account Number: [id number] Date of Birth/Sex: 03/04/1928 (85 y.o. F) Treating RN: Heather [name] Primary Care Andrian Sabala: Heather [name] Other Clinician: Referring Jiana Lemaire: Heather [name] Treating Doug Bucklin/Extender: Heather [name] in Treatment: 9 Encounter Discharge Information Items Discharge Condition: Stable Ambulatory Status: Walker Discharge  Destination: Home Transportation: Private Auto Accompanied By: caregiver Schedule Follow-up Appointment: Yes Clinical Summary of Care: Electronic Signature(s) Signed: 05/18/2018 4:45:14 PM By: Heather [name] Entered By: Heather [name] on 05/18/2018 13:00:29 Tellefsen, Heather [name] ([id number]) -------------------------------------------------------------------------------- Lower Extremity Assessment Details Patient Name: Heather [name] Date of Service: 05/18/2018 12:30 PM Medical Record Number: [id number] Patient Account Number: [id number] Date of Birth/Sex: September 22, 1928 (85 y.o. F) Treating RN: Heather [name] Primary Care Adaria Hole: Heather [name] Other Clinician: Referring Devera Englander: Heather [name] Treating Chanin Frumkin/Extender: Ricard Dillon Weeks in Treatment: 9 Electronic Signature(s) Signed: 05/18/2018 4:45:14 PM By: Heather [name] Entered By: Heather [name] on 05/18/2018 12:53:26 Foister, Heather [name] ([id number]) -------------------------------------------------------------------------------- Multi Wound Chart Details Patient Name: Heather [name] Date of Service: 05/18/2018 12:30 PM Medical Record Number: [id number] Patient Account Number: [id number] Date of Birth/Sex: [birth year] (85 y.o. F) Treating RN: Heather [name] Primary Care Landra Howze: Heather [name] Other Clinician: Referring Sarafina Puthoff: Heather [name] Treating Ashly Goethe/Extender: Heather [name] in Treatment: 9 Vital Signs Height(in): 62 Pulse(bpm): 97 Weight(lbs): 135 Blood Pressure(mmHg): 112/25 Body Mass Index(BMI): 25 Temperature(F): 97.7 Respiratory Rate 16 (breaths/min): Photos: Wound Location: Left, Proximal Peri-anal Left, Distal Peri-anal Right Peri-anal Wounding Event: Gradually Appeared Gradually Appeared Gradually Appeared Primary Etiology: Atypical Atypical Atypical Comorbid History: Cataracts, Anemia, Cataracts, Anemia, Cataracts, Anemia, Hypertension, Received Hypertension, Received Hypertension,  Received Chemotherapy Chemotherapy Chemotherapy Date Acquired: 12/19/2017 12/19/2017 12/19/2017 Weeks of Treatment: 9 9 9  Wound Status: Open Open Open Clustered Wound: No Yes No Clustered Quantity: N/A 1 N/A Measurements L x W x D 0x0x0 0x0x0 0x0x0 (cm) Area (cm) : 0 0 0 Volume (cm) : 0 0 0 % Reduction in Area: 100.00% 100.00% 100.00% % Reduction in Volume: 100.00% 100.00% 100.00% Classification: Full Thickness Without Full Thickness Without Full Thickness Without Exposed Support Structures Exposed Support Structures Exposed Support Structures Exudate Amount: None Present None Present Medium Exudate Type: N/A N/A  Serous Exudate Color: N/A N/A amber Wound Margin: Flat and Intact Flat and Intact Flat and Intact Granulation Amount: Large (67-100%) Large (67-100%) Medium (34-66%) Granulation Quality: Pink Red Pink Necrotic Amount: None Present (0%) None Present (0%) Medium (34-66%) Exposed Structures: Fascia: No Fascia: No Fat Layer (Subcutaneous Fat Layer (Subcutaneous Fat Layer (Subcutaneous Tissue) Exposed: Yes Tissue) Exposed: No Tissue) Exposed: No Fascia: No Mellette, Bryah D. ([id number]) Tendon: No Tendon: No Tendon: No Muscle: No Muscle: No Muscle: No Joint: No Joint: No Joint: No Bone: No Bone: No Bone: No Epithelialization: Medium (34-66%) Small (1-33%) Small (1-33%) Periwound Skin Texture: Excoriation: No Excoriation: No Excoriation: No Induration: No Induration: No Induration: No Callus: No Callus: No Callus: No Crepitus: No Crepitus: No Crepitus: No Rash: No Rash: No Rash: No Scarring: No Scarring: No Scarring: No Periwound Skin Moisture: Maceration: No Maceration: No Maceration: No Dry/Scaly: No Dry/Scaly: No Dry/Scaly: No Periwound Skin Color: Atrophie Blanche: No Atrophie Blanche: No Atrophie Blanche: No Cyanosis: No Cyanosis: No Cyanosis: No Ecchymosis: No Ecchymosis: No Ecchymosis: No Erythema: No Erythema: No Erythema:  No Hemosiderin Staining: No Hemosiderin Staining: No Hemosiderin Staining: No Mottled: No Mottled: No Mottled: No Pallor: No Pallor: No Pallor: No Rubor: No Rubor: No Rubor: No Temperature: No Abnormality No Abnormality No Abnormality Tenderness on Palpation: Yes Yes Yes Treatment Notes Electronic Signature(s) Signed: 05/19/2018 7:17:18 AM By: Linton Ham MD Entered By: Linton Ham on 05/18/2018 13:05:01 Dsouza, Heather [name] ([id number]) -------------------------------------------------------------------------------- Multi-Disciplinary Care Plan Details Patient Name: Heather [name] Date of Service: 05/18/2018 12:30 PM Medical Record Number: [id number] Patient Account Number: [id number] Date of Birth/Sex: August 21, 1928 (85 y.o. F) Treating RN: Heather [name] Primary Care Yaiza Palazzola: Heather [name] Other Clinician: Referring Oluwaseun Bruyere: Heather [name] Treating Kajah Santizo/Extender: Ricard Dillon Weeks in Treatment: 9 Active Inactive Electronic Signature(s) Signed: 05/18/2018 4:45:14 PM By: Heather [name] Entered By: Heather [name] on 05/18/2018 12:59:34 Mcveigh, Heather [name] ([id number]) -------------------------------------------------------------------------------- Pain Assessment Details Patient Name: Heather [name] Date of Service: 05/18/2018 12:30 PM Medical Record Number: [id number] Patient Account Number: [id number] Date of Birth/Sex: July 15, 1928 (85 y.o. F) Treating RN: Heather [name] Primary Care Nekia Maxham: Heather [name] Other Clinician: Referring Emiliya Chretien: Heather [name] Treating Emmalina Espericueta/Extender: Heather [name] in Treatment: 9 Active Problems Location of Pain Severity and Description of Pain Patient Has Paino No Site Locations Pain Management and Medication Current Pain Management: Electronic Signature(s) Signed: 05/18/2018 4:45:14 PM By: Heather [name] Entered By: Heather [name] on 05/18/2018 12:45:05 Vahey, Heather [name]  ([id number]) -------------------------------------------------------------------------------- Patient/Caregiver Education Details Patient Name: Heather [name] Date of Service: 05/18/2018 12:30 PM Medical Record Number: [id number] Patient Account Number: [id number] Date of Birth/Gender: Jul 08, 1928 (85 y.o. F) Treating RN: Heather [name] Primary Care Physician: Heather [name] Other Clinician: Referring Physician: Benita [name] Treating Physician/Extender: Heather [name] in Treatment: 9 Education Assessment Education Provided To: Patient Education Topics Provided Wound/Skin Impairment: Handouts: Caring for Your Ulcer Methods: Demonstration, Explain/Verbal Responses: State content correctly Electronic Signature(s) Signed: 05/18/2018 4:45:14 PM By: Heather [name] Entered By: Heather [name] on 05/18/2018 12:54:14 Cihlar, Heather [name] ([id number]) -------------------------------------------------------------------------------- Wound Assessment Details Patient Name: Heather [name] Date of Service: 05/18/2018 12:30 PM Medical Record Number: [id number] Patient Account Number: [id number] Date of Birth/Sex: February 18, 1928 (85 y.o. F) Treating RN: Heather [name] Primary Care Seda Kronberg: Heather [name] Other Clinician: Referring Fabrizzio Marcella: [name], Heather Salina Treating Telesia Ates/Extender: Ricard Dillon Weeks in Treatment: 9 Wound Status Wound Number: 1 Primary Atypical Etiology: Wound Location: Left, Proximal Peri-anal Wound Status: Open Wounding Event: Gradually Appeared Comorbid Cataracts, Anemia, Hypertension, Received  Date Acquired: 12/19/2017 History: Chemotherapy Weeks Of Treatment: 9 Clustered Wound: No Photos Wound Measurements Length: (cm) 0 Width: (cm) 0 Depth: (cm) 0 Area: (cm) 0 Volume: (cm) 0 % Reduction in Area: 100% % Reduction in Volume: 100% Epithelialization: Medium (34-66%) Tunneling: No Undermining: No Wound Description Full Thickness Without Exposed  Support Classification: Structures Wound Margin: Flat and Intact Exudate None Present Amount: Foul Odor After Cleansing: No Slough/Fibrino No Wound Bed Granulation Amount: Large (67-100%) Exposed Structure Granulation Quality: Pink Fascia Exposed: No Necrotic Amount: None Present (0%) Fat Layer (Subcutaneous Tissue) Exposed: No Tendon Exposed: No Muscle Exposed: No Joint Exposed: No Bone Exposed: No Periwound Skin Texture Texture Color Kilmartin, Kathyjo D. ([id number]) No Abnormalities Noted: No No Abnormalities Noted: No Callus: No Atrophie Blanche: No Crepitus: No Cyanosis: No Excoriation: No Ecchymosis: No Induration: No Erythema: No Rash: No Hemosiderin Staining: No Scarring: No Mottled: No Pallor: No Moisture Rubor: No No Abnormalities Noted: No Dry / Scaly: No Temperature / Pain Maceration: No Temperature: No Abnormality Tenderness on Palpation: Yes Electronic Signature(s) Signed: 05/18/2018 4:45:14 PM By: Heather [name] Entered By: Heather [name] on 05/18/2018 12:57:28 Wachs, Heather [name] ([id number]) -------------------------------------------------------------------------------- Wound Assessment Details Patient Name: Heather [name] Date of Service: 05/18/2018 12:30 PM Medical Record Number: [id number] Patient Account Number: [id number] Date of Birth/Sex: 11-May-1928 (85 y.o. F) Treating RN: Heather [name] Primary Care Tayten Bergdoll: Heather [name] Other Clinician: Referring Mattia Liford: Heather [name] Treating Nielle Duford/Extender: Heather [name] in Treatment: 9 Wound Status Wound Number: 2 Primary Atypical Etiology: Wound Location: Left, Distal Peri-anal Wound Status: Open Wounding Event: Gradually Appeared Comorbid Cataracts, Anemia, Hypertension, Received Date Acquired: 12/19/2017 History: Chemotherapy Weeks Of Treatment: 9 Clustered Wound: Yes Photos Wound Measurements Length: (cm) 0 % Red Width: (cm) 0 % Red Depth: (cm) 0 Epith Clustered Quantity: 1  [place] Area: (cm) 0 Unde Volume: (cm) 0 uction in Area: 100% uction in Volume: 100% elialization: Small (1-33%) ling: No rmining: No Wound Description Full Thickness Without Exposed Support Classification: Structures Wound Margin: Flat and Intact Exudate None Present Amount: Foul Odor After Cleansing: No Slough/Fibrino No Wound Bed Granulation Amount: Large (67-100%) Exposed Structure Granulation Quality: Red Fascia Exposed: No Necrotic Amount: None Present (0%) Fat Layer (Subcutaneous Tissue) Exposed: No Tendon Exposed: No Muscle Exposed: No Joint Exposed: No Bone Exposed: No Periwound Skin Texture [place], Paloma D. ([id number]) Texture Color No Abnormalities Noted: No No Abnormalities Noted: No Callus: No Atrophie Blanche: No Crepitus: No Cyanosis: No Excoriation: No Ecchymosis: No Induration: No Erythema: No Rash: No Hemosiderin Staining: No Scarring: No Mottled: No Pallor: No Moisture Rubor: No No Abnormalities Noted: No Dry / Scaly: No Temperature / Pain Maceration: No Temperature: No Abnormality Tenderness on Palpation: Yes Electronic Signature(s) Signed: 05/18/2018 4:45:14 PM By: Heather [name] Entered By: Heather [name] on 05/18/2018 12:57:28 Claude, Heather [name] ([id number]) -------------------------------------------------------------------------------- Wound Assessment Details Patient Name: Heather [name] Date of Service: 05/18/2018 12:30 PM Medical Record Number: [id number] Patient Account Number: [id number] Date of Birth/Sex: 08-Sep-1928 (85 y.o. F) Treating RN: Heather [name] Primary Care Caliana Spires: Heather [name] Other Clinician: Referring Mersades Barbaro: Heather [name] Treating Gisele Pack/Extender: Heather [name] in Treatment: 9 Wound Status Wound Number: 3 Primary Atypical Etiology: Wound Location: Right Peri-anal Wound Status: Open Wounding Event: Gradually Appeared Comorbid Cataracts, Anemia, Hypertension, Received Date Acquired:  12/19/2017 History: Chemotherapy Weeks Of Treatment: 9 Clustered Wound: No Photos Wound Measurements Length: (cm) 0 % Red Width: (cm) 0 % Red Depth: (cm) 0 Epith Area: (cm) 0 Tunn Volume: (cm) 0 Unde uction  in Area: 100% uction in Volume: 100% elialization: Small (1-33%) eling: No rmining: No Wound Description Full Thickness Without Exposed Support Classification: Structures Wound Margin: Flat and Intact Exudate Medium Amount: Exudate Type: Serous Exudate Color: amber Foul Odor After Cleansing: No Slough/Fibrino Yes Wound Bed Granulation Amount: Medium (34-66%) Exposed Structure Granulation Quality: Pink Fascia Exposed: No Necrotic Amount: Medium (34-66%) Fat Layer (Subcutaneous Tissue) Exposed: Yes Necrotic Quality: Adherent Slough Tendon Exposed: No Muscle Exposed: No Joint Exposed: No Bone Exposed: No Periwound Skin Texture Lege, Milly D. ([id number]) Texture Color No Abnormalities Noted: No No Abnormalities Noted: No Callus: No Atrophie Blanche: No Crepitus: No Cyanosis: No Excoriation: No Ecchymosis: No Induration: No Erythema: No Rash: No Hemosiderin Staining: No Scarring: No Mottled: No Pallor: No Moisture Rubor: No No Abnormalities Noted: No Dry / Scaly: No Temperature / Pain Maceration: No Temperature: No Abnormality Tenderness on Palpation: Yes Electronic Signature(s) Signed: 05/18/2018 4:45:14 PM By: Heather [name] Entered By: Heather [name] on 05/18/2018 12:57:28 Clenney, Heather [name] ([id number]) -------------------------------------------------------------------------------- Vitals Details Patient Name: Heather [name] Date of Service: 05/18/2018 12:30 PM Medical Record Number: [id number] Patient Account Number: [id number] Date of Birth/Sex: [birth year] (85 y.o. F) Treating RN: Heather [name] Primary Care Claramae Rigdon: Heather [name] Other Clinician: Referring Jamillah Camilo: Heather [name] Treating Jadden Yim/Extender: Heather [name] in  Treatment: 9 Vital Signs Time Taken: 12:45 Temperature (F): 97.7 Height (in): 62 Pulse (bpm): 97 Source: Stated Respiratory Rate (breaths/min): 16 Weight (lbs): 135 Blood Pressure (mmHg): 112/25 Source: Stated Reference Range: 80 - 120 mg / dl Body Mass Index (BMI): 24.7 Electronic Signature(s) Signed: 05/18/2018 4:45:14 PM By: Heather [name] Entered By: Heather [name] on 05/18/2018 12:53:16

## 2018-05-20 ENCOUNTER — Inpatient Hospital Stay: Payer: Medicare Other | Attending: Internal Medicine

## 2018-05-20 ENCOUNTER — Inpatient Hospital Stay: Payer: Medicare Other

## 2018-05-20 ENCOUNTER — Other Ambulatory Visit: Payer: Self-pay

## 2018-05-20 ENCOUNTER — Inpatient Hospital Stay (HOSPITAL_BASED_OUTPATIENT_CLINIC_OR_DEPARTMENT_OTHER): Payer: Medicare Other | Admitting: Internal Medicine

## 2018-05-20 ENCOUNTER — Other Ambulatory Visit: Payer: Self-pay | Admitting: *Deleted

## 2018-05-20 ENCOUNTER — Inpatient Hospital Stay (HOSPITAL_BASED_OUTPATIENT_CLINIC_OR_DEPARTMENT_OTHER): Payer: Medicare Other | Admitting: Hospice and Palliative Medicine

## 2018-05-20 VITALS — BP 126/64 | HR 102 | Temp 98.1°F | Resp 18 | Ht 62.0 in | Wt 128.2 lb

## 2018-05-20 DIAGNOSIS — B009 Herpesviral infection, unspecified: Secondary | ICD-10-CM

## 2018-05-20 DIAGNOSIS — C8309 Small cell B-cell lymphoma, extranodal and solid organ sites: Secondary | ICD-10-CM

## 2018-05-20 DIAGNOSIS — C9110 Chronic lymphocytic leukemia of B-cell type not having achieved remission: Secondary | ICD-10-CM | POA: Diagnosis not present

## 2018-05-20 DIAGNOSIS — Z7189 Other specified counseling: Secondary | ICD-10-CM

## 2018-05-20 DIAGNOSIS — E039 Hypothyroidism, unspecified: Secondary | ICD-10-CM | POA: Diagnosis not present

## 2018-05-20 DIAGNOSIS — Z66 Do not resuscitate: Secondary | ICD-10-CM | POA: Diagnosis not present

## 2018-05-20 DIAGNOSIS — R531 Weakness: Secondary | ICD-10-CM | POA: Diagnosis not present

## 2018-05-20 DIAGNOSIS — C8589 Other specified types of non-Hodgkin lymphoma, extranodal and solid organ sites: Principal | ICD-10-CM

## 2018-05-20 DIAGNOSIS — Z515 Encounter for palliative care: Secondary | ICD-10-CM

## 2018-05-20 DIAGNOSIS — R5383 Other fatigue: Secondary | ICD-10-CM

## 2018-05-20 LAB — CBC WITH DIFFERENTIAL/PLATELET
Abs Immature Granulocytes: 0.03 10*3/uL (ref 0.00–0.07)
Basophils Absolute: 0 10*3/uL (ref 0.0–0.1)
Basophils Relative: 1 %
Eosinophils Absolute: 0.1 10*3/uL (ref 0.0–0.5)
Eosinophils Relative: 3 %
HCT: 29.6 % — ABNORMAL LOW (ref 36.0–46.0)
Hemoglobin: 9.3 g/dL — ABNORMAL LOW (ref 12.0–15.0)
Immature Granulocytes: 1 %
Lymphocytes Relative: 53 %
Lymphs Abs: 1.8 10*3/uL (ref 0.7–4.0)
MCH: 29.8 pg (ref 26.0–34.0)
MCHC: 31.4 g/dL (ref 30.0–36.0)
MCV: 94.9 fL (ref 80.0–100.0)
Monocytes Absolute: 0.6 10*3/uL (ref 0.1–1.0)
Monocytes Relative: 16 %
Neutro Abs: 0.9 10*3/uL — ABNORMAL LOW (ref 1.7–7.7)
Neutrophils Relative %: 26 %
Platelets: 103 10*3/uL — ABNORMAL LOW (ref 150–400)
RBC: 3.12 MIL/uL — ABNORMAL LOW (ref 3.87–5.11)
RDW: 22 % — ABNORMAL HIGH (ref 11.5–15.5)
WBC: 3.5 10*3/uL — ABNORMAL LOW (ref 4.0–10.5)
nRBC: 4 % — ABNORMAL HIGH (ref 0.0–0.2)

## 2018-05-20 LAB — COMPREHENSIVE METABOLIC PANEL
ALT: 27 U/L (ref 0–44)
AST: 43 U/L — ABNORMAL HIGH (ref 15–41)
Albumin: 4.1 g/dL (ref 3.5–5.0)
Alkaline Phosphatase: 56 U/L (ref 38–126)
Anion gap: 8 (ref 5–15)
BUN: 13 mg/dL (ref 8–23)
CO2: 21 mmol/L — ABNORMAL LOW (ref 22–32)
Calcium: 8.6 mg/dL — ABNORMAL LOW (ref 8.9–10.3)
Chloride: 109 mmol/L (ref 98–111)
Creatinine, Ser: 0.79 mg/dL (ref 0.44–1.00)
GFR calc Af Amer: >60 mL/min (ref >60)
GFR calc non Af Amer: >60 mL/min (ref >60)
Glucose, Bld: 109 mg/dL — ABNORMAL HIGH (ref 70–99)
Potassium: 4.2 mmol/L (ref 3.5–5.1)
Sodium: 138 mmol/L (ref 135–145)
Total Bilirubin: 1.6 mg/dL — ABNORMAL HIGH (ref 0.3–1.2)
Total Protein: 6.5 g/dL (ref 6.5–8.1)

## 2018-05-20 LAB — LACTATE DEHYDROGENASE: LDH: 311 U/L — ABNORMAL HIGH (ref 98–192)

## 2018-05-20 NOTE — Progress Notes (Signed)
[place]  [phone] [phone]   Name: Heather [name] Date: 05/20/2018 MRN: [id number]  DOB: 16-Feb-1928  Patient Care Team: Albina Billet, MD as PCP - General (Internal Medicine)    REASON FOR CONSULTATION: Palliative Care consult requested for this 85 y.o. female with multiple medical problems including stage IV CLL most recently treated with Vanatoclax.  Most recent MRI in January 2020 showed her chronic splenic lesion to be slow-growing but also had a new splenic lesion noted.  Patient was hospitalized 03/08/2018 -03/10/2018 for monitoring of tumor lysis syndrome after starting Venetoclax. 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  Patient was referred to palliative care to help discuss goals.  SOCIAL HISTORY:     reports that she has never smoked. She has never used smokeless tobacco. She reports that she does not drink alcohol or use drugs.  Patient is widowed.  She lives at home alone.  She has no children.  She has sisters who are involved and bring her back and forth to medical appointments.  ADVANCE DIRECTIVES:  Does not have  CODE STATUS: Previously a DNR during last hospitalization  PAST MEDICAL HISTORY: Past Medical History:  Diagnosis Date  . Anemia   . Cataracts, bilateral   . Chronic lymphocytic leukemia ([place]) 06/06/2014  . Hearing loss   . History of chemotherapy   . Hyperlipidemia   . Hypertension   . Hypothyroidism   . Palpitations   . Risk for falls   . Thrombocytopenia ([place])     PAST SURGICAL HISTORY:  Past Surgical History:  Procedure Laterality Date  . CATARACT EXTRACTION Left 03/29/2012  . Excision left cervical node biopsy  05/04/2011    HEMATOLOGY/ONCOLOGY HISTORY:  Oncology History   1. Ultrasound revealed abdominal lymphadenopathy(March, 2013)  2. Biopsy as well as peripheral blood be suggestive of chronic lymphocytic leukemia (April, 2013) 3. Started on bendamustin and Rituxan from May of 2013 4. Has finished her 6 cycles of chemotherapy with Rituxan and bendamustine (October 26, 2011) 5.recurrent and progressive disease with progressive anemia.  Patient was started on IBRUTANIB (March, 2016)  taking 140 mg 3 pills a day  6. Patient was taken off IBRUTANIB in August of 2016 because of persistent myelosuppression even with reducing dose 7. Because of rising WBC count and anemia patient would be started back on a lower dose of improved on ibrutanib 140 mg 2 tablets a day; JAN 2020- stop Ibrutinib- sec to progression; BMBx- CLL; kayroptype-N; FISH-pending.   # FEB 18th 2020- start Venatoclax.;  Discontinued after a week #1 [perianal ulceration/]  #April 17 hypercalcemia calcium 11-status post Zometa  #Perianal ulceration-status post biopsy CLL HSV-status post radiation/valacyclovir. -----------------------------------------------------   DIAGNOSIS: _0  CLL  STAGE: 4 ; GOALS: Control  CURRENT/MOST RECENT THERAPY- Venatoclax.       Chronic lymphocytic leukemia (H[facility])    ALLERGIES:  is allergic to losartan.  MEDICATIONS:  Current Outpatient Medications  Medication Sig Dispense Refill  . allopurinol (ZYLOPRIM) 300 MG tablet Take 1 tablet (300 mg total) by mouth 2 (two) times daily. (Patient not taking: Reported on 05/09/2018) 120 tablet 0  . levothyroxine (SYNTHROID, LEVOTHROID) 75 MCG tablet Take 75 mcg by mouth daily before breakfast. Only takes Saturday- Thursday. Hold synthroid on Friday    . metoprolol tartrate (LOPRESSOR) 25 MG tablet Take 1 tablet (25 mg total) by mouth 2 (two)  times daily. 60 tablet 0   No current facility-administered medications for this visit.     VITAL SIGNS: There were no vitals taken for this visit. There were no vitals filed for this visit.  Estimated body mass index is 23.46 kg/m as  calculated from the following:   Height as of an earlier encounter on 05/20/18: _0  (1.575 m).   Weight as of an earlier encounter on 05/20/18: 128 lb 4 oz (58.2 kg).  LABS: CBC:    Component Value Date/Time   WBC 3.5 (L) 05/20/2018 1240   HGB 9.3 (L) 05/20/2018 1240   HGB 9.4 (L) 05/08/2014 1349   HCT 29.6 (L) 05/20/2018 1240   HCT 30.4 (L) 05/08/2014 1349   PLT 103 (L) 05/20/2018 1240   PLT 128 (L) 05/08/2014 1349   MCV 94.9 05/20/2018 1240   MCV 100 05/08/2014 1349   NEUTROABS 0.9 (L) 05/20/2018 1240   NEUTROABS 4.5 05/08/2014 1349   LYMPHSABS 1.8 05/20/2018 1240   LYMPHSABS 34.6 (H) 05/08/2014 1349   MONOABS 0.6 05/20/2018 1240   MONOABS 1.6 (H) 05/08/2014 1349   EOSABS 0.1 05/20/2018 1240   EOSABS 0.1 05/08/2014 1349   BASOSABS 0.0 05/20/2018 1240   BASOSABS 0.1 05/08/2014 1349   Comprehensive Metabolic Panel:    Component Value Date/Time   NA 138 05/20/2018 1240   NA 133 (L) 05/08/2014 1349   K 4.2 05/20/2018 1240   K 3.6 05/08/2014 1349   CL 109 05/20/2018 1240   CL 102 05/08/2014 1349   CO2 21 (L) 05/20/2018 1240   CO2 26 05/08/2014 1349   BUN 13 05/20/2018 1240   BUN 18 05/08/2014 1349   CREATININE 0.79 05/20/2018 1240   CREATININE 1.13 (H) 05/08/2014 1349   GLUCOSE 109 (H) 05/20/2018 1240   GLUCOSE 82 05/08/2014 1349   CALCIUM 8.6 (L) 05/20/2018 1240   CALCIUM 9.1 05/08/2014 1349   AST 43 (H) 05/20/2018 1240   AST 24 05/08/2014 1349   ALT 27 05/20/2018 1240   ALT 13 (L) 05/08/2014 1349   ALKPHOS 56 05/20/2018 1240   ALKPHOS 41 05/08/2014 1349   BILITOT 1.6 (H) 05/20/2018 1240   BILITOT 1.3 (H) 05/08/2014 1349   PROT 6.5 05/20/2018 1240   PROT 6.7 05/08/2014 1349   ALBUMIN 4.1 05/20/2018 1240   ALBUMIN 4.2 05/08/2014 1349    RADIOGRAPHIC STUDIES: No results found.  PERFORMANCE STATUS (ECOG) : 2 - Symptomatic, <50% confined to bed  Review of Systems Unless otherwise noted, a complete review of systems is negative.  Physical Exam General:  NAD, frail appearing, thin Pulmonary: unlabored Extremities: no edema Skin: no rashes Neurological: oriented to person and place, weakness  IMPRESSION: Routine follow-up visit made in the clinic.  Patient also met today with Dr. Rogue Bussing.  Calcium level has normalized with Zometa.  Patient's cognition seems mildly improved today.  She denies any acute or distressing symptoms.  Patient's sister, Heather [name], participated in my visit via speaker phone.  Both patient and sister verbalized an understanding that she is not considered a viable treatment candidate.  Dr. Rogue Bussing has recommended hospice at home.  Patient says she thought hospice was "where you go to die."  I explained in detail what hospice services would entail in the home.  Both patient and Heather [name] agreed with hospice referral.  We also discussed CODE STATUS.  Patient stated that she would not want her life prolonged on machines nor would she want CPR.  Heather [name] says that she would agree  with patient being a "DO NOT RESUSCITATE."  I completed a DNR order for her to take home.  I then met with patient's other sister, Heather [name], outside of the cancer center.  I updated her on the clinic visit and plan.  Heather [name] also verbalized agreement with hospice services in the home.  She also stated that she did not think that patient would want to be resuscitated and agreed with the DNR order.  I called and spoke with Heather Real, NP with palliative care.  I appreciate their involvement and facilitating PCS services in the home.  PLAN: Supportive care Hospice referral DNR RTC in 4 weeks   Time Total: 30 minutes  Visit consisted of counseling and education dealing with the complex and emotionally intense issues of symptom management and palliative care in the setting of serious and potentially life-threatening illness.Greater than 50%  of this time was spent counseling and coordinating care related to the above assessment and plan.  Signed by:  Altha Harm, PhD, NP-C [phone] (Work Cell)

## 2018-05-20 NOTE — Assessment & Plan Note (Addendum)
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 PET scan OCT 2019- moderate splenomegaly; slightly increased in size [compared to CT scan July 2019]; January 2020 MRI-approximately 11 cm splenic lesion/chronic but slowly growing; also about inch in size splenic lesion.  Clinically worsening.  # continue to HOLD systemic therapy- given Poor PS.  Recommend hospice given poor tolerance of therapy.  #Perianal CLL/ superinfection with HSV- s/p valacyclovir- s/p RT. I improved.  # Hypercalcemia- 11.3 ? malignancy related.?  Etiology/ albumin-4.1. s/p  Zometa- improved today 8.3;   # Hypothyroidism-on Synthroid- F4-normal/feb 2020.  Stable  #Overall prognosis goals-discussed-recommend hospice at home.  Discussed with Josh.  # DISPOSITION:  # NO Zometa today # follow up in 5 weeks/labs-cbc/bmp/MD- possible zometa-B

## 2018-05-20 NOTE — Progress Notes (Signed)
[facility] OFFICE PROGRESS NOTE  Patient Care Team: Albina Billet, MD as PCP - General (Internal Medicine)  Cancer Staging No matching staging information was found for the patient.   Oncology History   1. Ultrasound revealed abdominal lymphadenopathy(March, 2013) 2. Biopsy as well as peripheral blood be suggestive of chronic lymphocytic leukemia (April, 2013) 3. Started on bendamustin and Rituxan from May of 2013 4. Has finished her 6 cycles of chemotherapy with Rituxan and bendamustine (October 26, 2011) 5.recurrent and progressive disease with progressive anemia.  Patient was started on IBRUTANIB (March, 2016)  taking 140 mg 3 pills a day  6. Patient was taken off IBRUTANIB in August of 2016 because of persistent myelosuppression even with reducing dose 7. Because of rising WBC count and anemia patient would be started back on a lower dose of improved on ibrutanib 140 mg 2 tablets a day; JAN 2020- stop Ibrutinib- sec to progression; BMBx- CLL; kayroptype-N; FISH-pending.   # FEB 18th 2020- start Venatoclax.;  Discontinued after a week #1 [perianal ulceration/]  #April 17 hypercalcemia calcium 11-status post Zometa  #Perianal ulceration-status post biopsy CLL HSV-status post radiation/valacyclovir. -----------------------------------------------------   DIAGNOSIS: [ ]  CLL  STAGE: 4 ; GOALS: Control  CURRENT/MOST RECENT THERAPY- Venatoclax.       Chronic lymphocytic leukemia ([place])    INTERVAL HISTORY: Patient a poor historian.  [name] 85 y.o.  female pleasant patient above history of CLL most currently on venetoclax 20mg /day [started 2/18]-while hospital.  However currently on hold because of continued decline in performance status.  At last visit patient received Zometa for a calcium of 11.2.   Patient continues to feel poorly.  Continues to complain of worsening fatigue and malaise.   Review of Systems  Constitutional: Positive for  malaise/fatigue and weight loss. Negative for chills, diaphoresis and fever.  HENT: Negative for nosebleeds and sore throat.   Eyes: Negative for double vision.  Respiratory: Positive for shortness of breath (Shortness of breath only with exertion.). Negative for cough, hemoptysis, sputum production and wheezing.   Cardiovascular: Negative for chest pain, palpitations, orthopnea and leg swelling.  Gastrointestinal: Negative for abdominal pain, blood in stool, constipation, diarrhea, heartburn, melena, nausea and vomiting.  Genitourinary: Negative for dysuria, frequency and urgency.  Musculoskeletal: Negative for back pain and joint pain.  Skin: Negative.  Negative for itching and rash.  Neurological: Negative for dizziness, tingling, focal weakness, weakness and headaches.  Endo/Heme/Allergies: Does not bruise/bleed easily.  Psychiatric/Behavioral: Negative for depression. The patient is not nervous/anxious and does not have insomnia.       PAST MEDICAL HISTORY :  Past Medical History:  Diagnosis Date  . Anemia   . Cataracts, bilateral   . Chronic lymphocytic leukemia ([place]) 06/06/2014  . Hearing loss   . History of chemotherapy   . Hyperlipidemia   . Hypertension   . Hypothyroidism   . Palpitations   . Risk for falls   . Thrombocytopenia ([place])     PAST SURGICAL HISTORY :   Past Surgical History:  Procedure Laterality Date  . CATARACT EXTRACTION Left 03/29/2012  . Excision left cervical node biopsy  05/04/2011    FAMILY HISTORY :   Family History  Problem Relation Age of Onset  . Heart disease Mother   . Stomach cancer Father   . Diabetes Sister     SOCIAL HISTORY:   Social History   Tobacco Use  . Smoking status: Never Smoker  . Smokeless tobacco: Never Used  Substance Use Topics  . Alcohol use: No    Alcohol/week: 0.0 standard drinks  . Drug use: No    ALLERGIES:  is allergic to losartan.  MEDICATIONS:  Current Outpatient Medications  Medication Sig  Dispense Refill  . allopurinol (ZYLOPRIM) 300 MG tablet Take 1 tablet (300 mg total) by mouth 2 (two) times daily. (Patient not taking: Reported on 05/27/2018) 120 tablet 0  . hydrALAZINE (APRESOLINE) 25 MG tablet TAKE 1 TABLET (25 MG TOTAL) BY MOUTH 2 (TWO) TIMES DAILY.    Marland Kitchen levothyroxine (SYNTHROID, LEVOTHROID) 75 MCG tablet Take 75 mcg by mouth daily before breakfast. Only takes Saturday- Thursday. Hold synthroid on Friday    . losartan (COZAAR) 100 MG tablet Take 100 mg by mouth daily.    . metoprolol tartrate (LOPRESSOR) 25 MG tablet Take 1 tablet (25 mg total) by mouth 2 (two) times daily. 60 tablet 0   No current facility-administered medications for this visit.     PHYSICAL EXAMINATION: ECOG PERFORMANCE STATUS: 2 - Symptomatic, <50% confined to bed  BP 126/64 (BP Location: Left Arm, Patient Position: Sitting, Cuff Size: Normal)   Pulse (!) 102   Temp 98.1 F (36.7 C) (Tympanic)   Resp 18   Ht 5\' 2"  (1.575 m)   Wt 128 lb 4 oz (58.2 kg)   BMI 23.46 kg/m   Filed Weights   05/20/18 1308  Weight: 128 lb 4 oz (58.2 kg)    Physical Exam  Constitutional: She is oriented to person, place, and time and well-developed, well-nourished, and in no distress.  Elderly frail female patient she is in a wheel chair.  She is alone.   HENT:  Head: Normocephalic and atraumatic.  Mouth/Throat: Oropharynx is clear and moist. No oropharyngeal exudate.  Eyes: Pupils are equal, round, and reactive to light.  Neck: Normal range of motion. Neck supple.  Cardiovascular: Normal rate and regular rhythm.  Pulmonary/Chest: No respiratory distress. She has no wheezes.  Abdominal: Soft. Bowel sounds are normal. She exhibits no distension and no mass. There is no abdominal tenderness. There is no rebound and no guarding.  Musculoskeletal: Normal range of motion.        General: No tenderness or edema.     Comments: Mild bilateral leg swelling.  Neurological: She is alert and oriented to person, place, and  time.  Skin: Skin is warm.  Psychiatric: Affect normal.      LABORATORY DATA:  I have reviewed the data as listed    Component Value Date/Time   NA 138 05/20/2018 1240   NA 133 (L) 05/08/2014 1349   K 4.2 05/20/2018 1240   K 3.6 05/08/2014 1349   CL 109 05/20/2018 1240   CL 102 05/08/2014 1349   CO2 21 (L) 05/20/2018 1240   CO2 26 05/08/2014 1349   GLUCOSE 109 (H) 05/20/2018 1240   GLUCOSE 82 05/08/2014 1349   BUN 13 05/20/2018 1240   BUN 18 05/08/2014 1349   CREATININE 0.79 05/20/2018 1240   CREATININE 1.13 (H) 05/08/2014 1349   CALCIUM 8.6 (L) 05/20/2018 1240   CALCIUM 8.7 05/20/2018 1240   PROT 6.5 05/20/2018 1240   PROT 6.7 05/08/2014 1349   ALBUMIN 4.1 05/20/2018 1240   ALBUMIN 4.2 05/08/2014 1349   AST 43 (H) 05/20/2018 1240   AST 24 05/08/2014 1349   ALT 27 05/20/2018 1240   ALT 13 (L) 05/08/2014 1349   ALKPHOS 56 05/20/2018 1240   ALKPHOS 41 05/08/2014 1349   BILITOT 1.6 (H) 05/20/2018  1240   BILITOT 1.3 (H) 05/08/2014 1349   GFRNONAA >60 05/20/2018 1240   GFRNONAA 45 (L) 05/08/2014 1349   GFRAA >60 05/20/2018 1240   GFRAA 52 (L) 05/08/2014 1349    No results found for: SPEP, UPEP  Lab Results  Component Value Date   WBC 3.5 (L) 05/20/2018   NEUTROABS 0.9 (L) 05/20/2018   HGB 9.3 (L) 05/20/2018   HCT 29.6 (L) 05/20/2018   MCV 94.9 05/20/2018   PLT 103 (L) 05/20/2018      Chemistry      Component Value Date/Time   NA 138 05/20/2018 1240   NA 133 (L) 05/08/2014 1349   K 4.2 05/20/2018 1240   K 3.6 05/08/2014 1349   CL 109 05/20/2018 1240   CL 102 05/08/2014 1349   CO2 21 (L) 05/20/2018 1240   CO2 26 05/08/2014 1349   BUN 13 05/20/2018 1240   BUN 18 05/08/2014 1349   CREATININE 0.79 05/20/2018 1240   CREATININE 1.13 (H) 05/08/2014 1349      Component Value Date/Time   CALCIUM 8.6 (L) 05/20/2018 1240   CALCIUM 8.7 05/20/2018 1240   ALKPHOS 56 05/20/2018 1240   ALKPHOS 41 05/08/2014 1349   AST 43 (H) 05/20/2018 1240   AST 24 05/08/2014  1349   ALT 27 05/20/2018 1240   ALT 13 (L) 05/08/2014 1349   BILITOT 1.6 (H) 05/20/2018 1240   BILITOT 1.3 (H) 05/08/2014 1349       RADIOGRAPHIC STUDIES: I have personally reviewed the radiological images as listed and agreed with the findings in the report. No results found.   ASSESSMENT & PLAN:  Chronic lymphocytic leukemia ([place]) # CLL-most recently on Ibrutinib. PET scan OCT 2019- moderate splenomegaly; slightly increased in size [compared to CT scan July 2019]; January 2020 MRI-approximately 11 cm splenic lesion/chronic but slowly growing; also about inch in size splenic lesion.  Clinically worsening.  # continue to HOLD systemic therapy- given Poor PS.  Recommend hospice given poor tolerance of therapy.  #Perianal CLL/ superinfection with HSV- s/p valacyclovir- s/p RT. I improved.  # Hypercalcemia- 11.3 ? malignancy related.?  Etiology/ albumin-4.1. s/p  Zometa- improved today 8.3;   # Hypothyroidism-on Synthroid- F4-normal/feb 2020.  Stable  #Overall prognosis goals-discussed-recommend hospice at home.  Discussed with Josh.  # DISPOSITION:  # NO Zometa today # follow up in 5 weeks/labs-cbc/bmp/MD- possible zometa-B     No orders of the defined types were placed in this encounter.  All questions were answered. The patient knows to call the clinic with any problems, questions or concerns.      Cammie Sickle, MD 06/07/2018 8:11 AM

## 2018-05-21 LAB — PTH, INTACT AND CALCIUM
Calcium, Total (PTH): 8.7 mg/dL (ref 8.7–10.3)
PTH: 84 pg/mL — ABNORMAL HIGH (ref 15–65)

## 2018-05-23 LAB — PTH-RELATED PEPTIDE: PTH-related peptide: <2 pmol/L

## 2018-05-23 LAB — VIRUS CULTURE

## 2018-05-26 ENCOUNTER — Other Ambulatory Visit: Payer: Self-pay

## 2018-05-27 ENCOUNTER — Other Ambulatory Visit: Payer: Self-pay

## 2018-05-27 ENCOUNTER — Encounter: Payer: Self-pay | Admitting: Radiation Oncology

## 2018-05-27 ENCOUNTER — Ambulatory Visit
Admission: RE | Admit: 2018-05-27 | Discharge: 2018-05-27 | Disposition: A | Discharge status: Home / Self Care | Source: Ambulatory Visit | Attending: Radiation Oncology | Admitting: Radiation Oncology

## 2018-05-27 DIAGNOSIS — C8306 Small cell B-cell lymphoma, intrapelvic lymph nodes: Secondary | ICD-10-CM | POA: Diagnosis not present

## 2018-05-27 DIAGNOSIS — C9190 Lymphoid leukemia, unspecified not having achieved remission: Secondary | ICD-10-CM | POA: Diagnosis not present

## 2018-05-27 DIAGNOSIS — Z923 Personal history of irradiation: Secondary | ICD-10-CM | POA: Diagnosis not present

## 2018-05-27 DIAGNOSIS — R54 Age-related physical debility: Secondary | ICD-10-CM | POA: Insufficient documentation

## 2018-05-27 DIAGNOSIS — C8300 Small cell B-cell lymphoma, unspecified site: Secondary | ICD-10-CM

## 2018-05-27 NOTE — Progress Notes (Signed)
Radiation Oncology Follow up Note  Name: Heather [name]   Date:   05/27/2018 MRN:  [id number] DOB: 1928-04-05    This 85 y.o. female presents to the clinic today for 1 month follow-up status post palliative radiation therapy for perianal lymphoma.  REFERRING PROVIDER: Albina Billet, MD  HPI: Patient is an 85 year old female now at 1 month having received electron-beam radiation therapy in a palliative mode for perianal lymphoma..  She has a diagnosis of CLL and has been treated with Rituxan and in the past.  She is seen today in routine follow-up is doing well she states the area around her anal area has healed.  She is having no pain itching or tenderness in that region.  She is quite frail and is currently on hold for any type of treatment with medical oncology.  COMPLICATIONS OF TREATMENT: none  FOLLOW UP COMPLIANCE: keeps appointments   PHYSICAL EXAM:  BP (!) (P) 128/55 (BP Location: Left Arm, Patient Position: Sitting)   Pulse (P) 90   Temp (P) 97.8 F (36.6 C) (Tympanic)   Resp (P) 18  Examination of the perianal region shows no evidence of mass or nodularity area has completely resolved.  No tenderness is noted on palpation.  Well-developed well-nourished patient in NAD. HEENT reveals PERLA, EOMI, discs not visualized.  Oral cavity is clear. No oral mucosal lesions are identified. Neck is clear without evidence of cervical or supraclavicular adenopathy. Lungs are clear to A&P. Cardiac examination is essentially unremarkable with regular rate and rhythm without murmur rub or thrill. Abdomen is benign with no organomegaly or masses noted. Motor sensory and DTR levels are equal and symmetric in the upper and lower extremities. Cranial nerves II through XII are grossly intact. Proprioception is intact. No peripheral adenopathy or edema is identified. No motor or sensory levels are noted. Crude visual fields are within normal range.  RADIOLOGY RESULTS: No current films for review  PLAN:  Present time patient is doing well set excellent palliative benefit from electron-beam radiation to the perianal region.  I will turn follow-up care over to medical oncology.  I would be happy to reevaluate the patient in the future for any further palliative treatment.  Patient knows to call with any concerns.  I would like to take this opportunity to thank you for allowing me to participate in the care of your patient.Noreene Filbert, MD

## 2018-05-28 ENCOUNTER — Other Ambulatory Visit: Payer: Self-pay | Admitting: Internal Medicine

## 2018-06-06 ENCOUNTER — Telehealth: Payer: Self-pay | Admitting: *Deleted

## 2018-06-06 NOTE — Telephone Encounter (Signed)
I spoke with sister, Izora Gala. She wanted to ask about the upcoming infusion appointment in June. IWe discussed this appointment. Patient reportedly is doing well without acute changes. She is being followed by hospice.

## 2018-06-06 NOTE — Telephone Encounter (Signed)
ister Heather [name] called asking that J Borders return her call [phone]

## 2018-06-15 ENCOUNTER — Telehealth: Payer: Self-pay | Admitting: *Deleted

## 2018-06-15 NOTE — Telephone Encounter (Signed)
Pt under hospice services. Per md order ok to cnl patient's apts- apts not needed at this time. Unable to leave msg for dorothy. Left msg for nancy to return my phone call-explained future apts to be cnl for next week. Also spoke with patient regarding cnl pt's apts. Pt gave verbal understanding and repeated back to me that her apts were cnl.

## 2018-06-15 NOTE — Telephone Encounter (Signed)
I agree with you, heather. Thx

## 2018-06-17 ENCOUNTER — Telehealth: Payer: Self-pay | Admitting: Hospice and Palliative Medicine

## 2018-06-17 NOTE — Telephone Encounter (Signed)
I received a call from patient's sister, [name]. She was asking about f/u appointments in the [facility]. Patient has no current scheduled appointments. Patient can see [place] as needed. However, she is actively followed by hospice in the home. Sister asked about f/u regarding patient's serum calcium.   Case discussed with Dr. Jacinto Reap.   I called and left a message with patient's hospice nurse Gwinda Passe [phone]). Will order CMET in 2-3 weeks.

## 2018-06-24 ENCOUNTER — Ambulatory Visit: Payer: Medicare Other | Admitting: Internal Medicine

## 2018-06-24 ENCOUNTER — Other Ambulatory Visit: Payer: Medicare Other

## 2018-06-24 ENCOUNTER — Encounter: Payer: Medicare Other | Admitting: Hospice and Palliative Medicine

## 2018-06-24 ENCOUNTER — Ambulatory Visit: Payer: Medicare Other

## 2018-07-06 ENCOUNTER — Telehealth: Payer: Self-pay | Admitting: *Deleted

## 2018-07-06 NOTE — Telephone Encounter (Signed)
-----   Message from Sabino Gasser, RN sent at 07/06/2018 11:42 AM EDT ----- Regarding: lab/zometa Co[place], please find a slot this week for lab/zometa.  Hospice patient. Per Josh contact sister's with apts.

## 2018-07-06 NOTE — Telephone Encounter (Signed)
Spoke with Billey Chang, NP. Plan of care discussed. Hospice unable to draw metc due to poor venous access. Per NP - arrange for metc to be drawn in the clinic with possible zometa this week. msg sent to scheduling team.

## 2018-07-07 ENCOUNTER — Telehealth: Payer: Self-pay | Admitting: Internal Medicine

## 2018-07-07 ENCOUNTER — Other Ambulatory Visit: Payer: Self-pay

## 2018-07-07 NOTE — Telephone Encounter (Signed)
Spoke to pt and completed travel screen. Also explained about addl screening questions they will be asked, new guidelines about mask req, no visitors, and fever checks °

## 2018-07-08 ENCOUNTER — Other Ambulatory Visit: Payer: Self-pay

## 2018-07-08 ENCOUNTER — Inpatient Hospital Stay: Attending: Internal Medicine

## 2018-07-08 VITALS — BP 150/67 | HR 96 | Temp 99.0°F | Resp 20

## 2018-07-08 DIAGNOSIS — C8309 Small cell B-cell lymphoma, extranodal and solid organ sites: Secondary | ICD-10-CM

## 2018-07-08 DIAGNOSIS — C9110 Chronic lymphocytic leukemia of B-cell type not having achieved remission: Secondary | ICD-10-CM | POA: Diagnosis present

## 2018-07-08 LAB — COMPREHENSIVE METABOLIC PANEL
ALT: 19 U/L (ref 0–44)
AST: 39 U/L (ref 15–41)
Albumin: 4.5 g/dL (ref 3.5–5.0)
Alkaline Phosphatase: 49 U/L (ref 38–126)
Anion gap: 9 (ref 5–15)
BUN: 18 mg/dL (ref 8–23)
CO2: 24 mmol/L (ref 22–32)
Calcium: 9.4 mg/dL (ref 8.9–10.3)
Chloride: 106 mmol/L (ref 98–111)
Creatinine, Ser: 0.96 mg/dL (ref 0.44–1.00)
GFR calc Af Amer: >60 mL/min (ref >60)
GFR calc non Af Amer: 53 mL/min — ABNORMAL LOW (ref >60)
Glucose, Bld: 102 mg/dL — ABNORMAL HIGH (ref 70–99)
Potassium: 4.2 mmol/L (ref 3.5–5.1)
Sodium: 139 mmol/L (ref 135–145)
Total Bilirubin: 1.7 mg/dL — ABNORMAL HIGH (ref 0.3–1.2)
Total Protein: 7 g/dL (ref 6.5–8.1)

## 2018-07-08 MED ORDER — ZOLEDRONIC ACID 4 MG/100ML IV SOLN
4.0000 mg | Freq: Once | INTRAVENOUS | Status: AC
  Administered 2018-07-08: 4 mg via INTRAVENOUS
  Filled 2018-07-08: qty 100

## 2018-07-08 MED ORDER — SODIUM CHLORIDE 0.9 % IV SOLN
Freq: Once | INTRAVENOUS | Status: AC
  Administered 2018-07-08: 14:00:00 via INTRAVENOUS
  Filled 2018-07-08: qty 250

## 2018-09-28 ENCOUNTER — Other Ambulatory Visit: Payer: Self-pay | Admitting: Internal Medicine

## 2018-09-28 NOTE — Telephone Encounter (Signed)
Josh, please advise if patient still need to be taking this medication. She is on hospice.

## 2018-09-28 NOTE — Telephone Encounter (Signed)
Spoke with Gwinda Passe, RN, with hospice [phone]). BP stable. Patient is taking oral meds. Will refill hydralazine.

## 2018-11-18 ENCOUNTER — Encounter: Payer: Self-pay | Admitting: Internal Medicine

## 2018-11-22 ENCOUNTER — Encounter: Payer: Self-pay | Admitting: Internal Medicine

## 2018-11-22 ENCOUNTER — Telehealth: Payer: Self-pay | Admitting: *Deleted

## 2018-11-22 NOTE — Telephone Encounter (Signed)
Hospice called to report patient expired at 5:50 this morning

## 2018-12-20 DEATH — deceased

## 2019-03-23 IMAGING — CT CT BIOPSY AND ASPIRATION BONE MARROW
1 of 2 series · 8 of 14 positions shown, 10 images · non-contrast
Comparison: none

CLINICAL DATA: Chronic lymphocytic leukemia

EXAM:
CT GUIDED DEEP ILIAC BONE ASPIRATION AND CORE BIOPSY
TECHNIQUE: Patient was placed prone on the CT gantry and limited axial scans
through the pelvis were obtained. Appropriate skin entry site was
identified. Skin site was marked, prepped with chlorhexidine, draped
in usual sterile fashion, and infiltrated locally with 1% lidocaine.

[Series 2: i-spiral 5.0 b30f · axial · 0.62mm/px · z∈[+1046,+1106]mm · 8 of 23 slices shown, 10 images]
[im 3/23  soft-tissue]
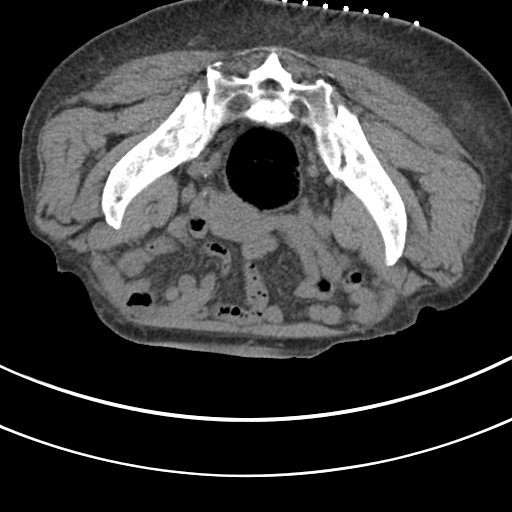
[im 3/23  bone]
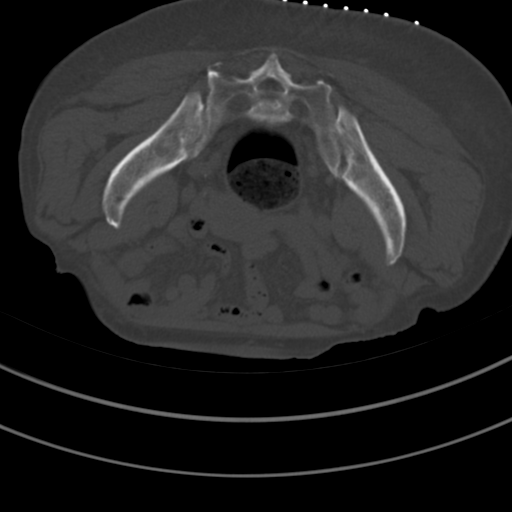
[im 5/23  bone]
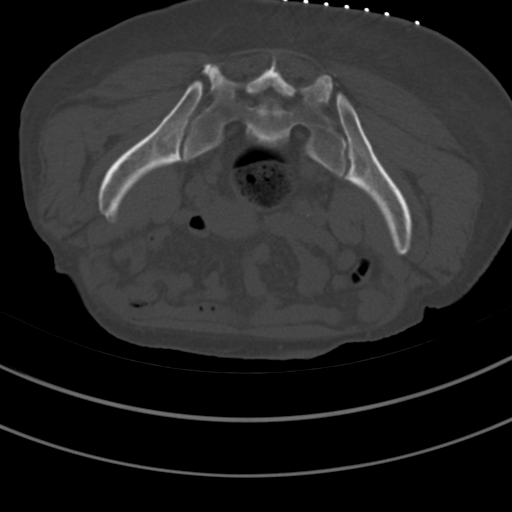
[im 8/23  bone]
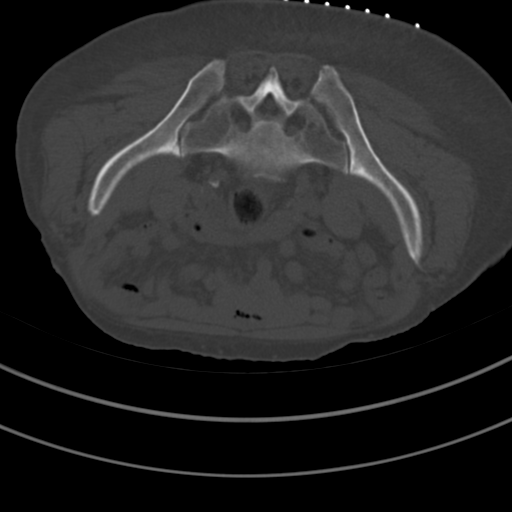
[im 10/23  bone]
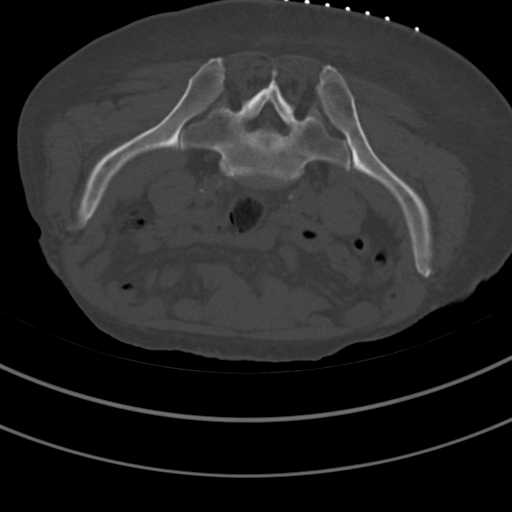
[im 13/23  soft-tissue]
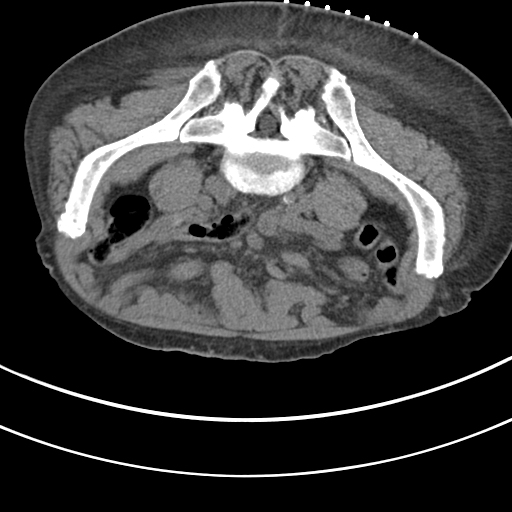
[im 13/23  bone]
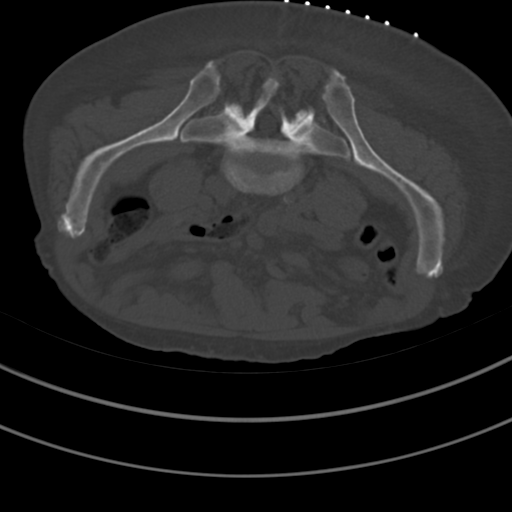
[im 15/23  bone]
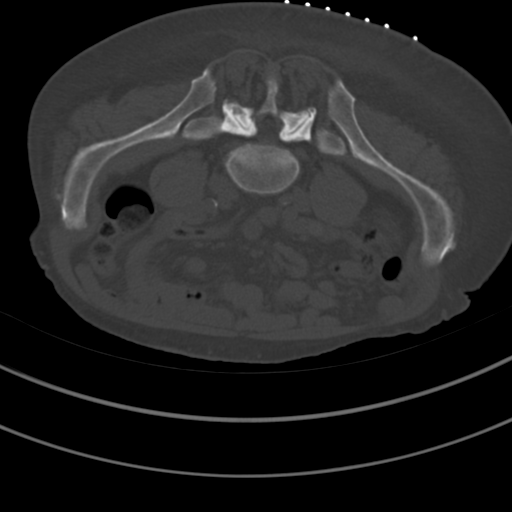
[im 18/23  bone]
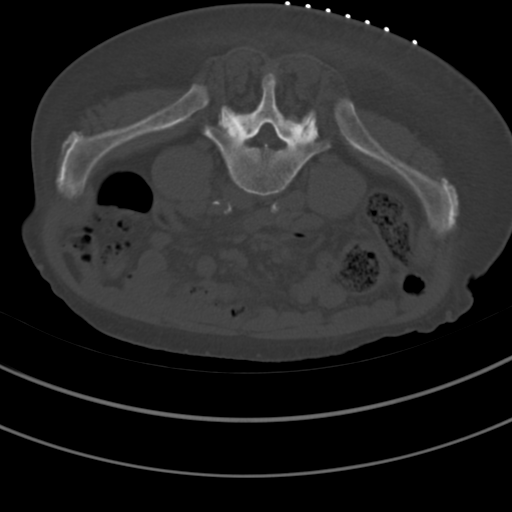
[im 20/23  bone]
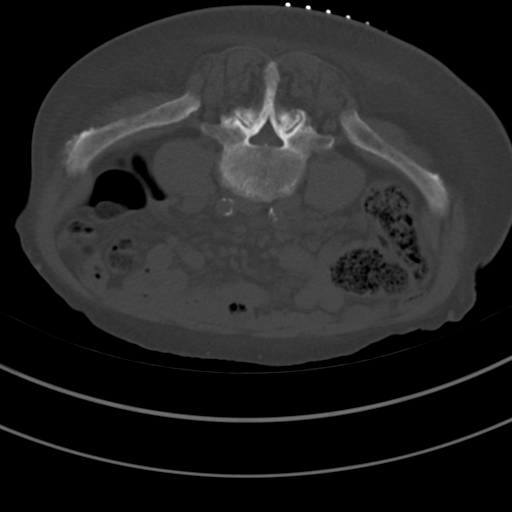

[8 of 14 positions shown; findings below may reference images not displayed]

Intravenous Fentanyl and Versed were administered as conscious
sedation during continuous monitoring of the patient's level of
consciousness and physiological / cardiorespiratory status by the
radiology RN, with a total moderate sedation time of 8 minutes.

Under CT fluoroscopic guidance an 11-gauge Cook trocar bone needle
was advanced into the right iliac bone just lateral to the
sacroiliac joint. Once needle tip position was confirmed, core and
aspiration samples were obtained, submitted to pathology for
approval. Post procedure scans show no hematoma or fracture. Patient
tolerated procedure well.

COMPLICATIONS:
COMPLICATIONS
none
IMPRESSION: 1. Technically successful CT guided right iliac bone core and
aspiration biopsy.
